# Patient Record
Sex: Female | Born: 1935 | Race: White | Hispanic: No | Marital: Married | Smoking: Never smoker
Health system: Southern US, Community
[De-identification: ages and names within clinical notes are randomized; demographics above are authoritative.]

## PROBLEM LIST (undated history)

## (undated) DIAGNOSIS — S72009A Fracture of unspecified part of neck of unspecified femur, initial encounter for closed fracture: Secondary | ICD-10-CM

## (undated) DIAGNOSIS — S2249XA Multiple fractures of ribs, unspecified side, initial encounter for closed fracture: Secondary | ICD-10-CM

## (undated) DIAGNOSIS — M810 Age-related osteoporosis without current pathological fracture: Secondary | ICD-10-CM

## (undated) DIAGNOSIS — G20A1 Parkinson's disease without dyskinesia, without mention of fluctuations: Secondary | ICD-10-CM

## (undated) DIAGNOSIS — K219 Gastro-esophageal reflux disease without esophagitis: Secondary | ICD-10-CM

## (undated) DIAGNOSIS — M546 Pain in thoracic spine: Secondary | ICD-10-CM

## (undated) DIAGNOSIS — K117 Disturbances of salivary secretion: Secondary | ICD-10-CM

## (undated) DIAGNOSIS — I10 Essential (primary) hypertension: Secondary | ICD-10-CM

## (undated) DIAGNOSIS — M4120 Other idiopathic scoliosis, site unspecified: Secondary | ICD-10-CM

## (undated) DIAGNOSIS — M1990 Unspecified osteoarthritis, unspecified site: Secondary | ICD-10-CM

## (undated) DIAGNOSIS — T887XXA Unspecified adverse effect of drug or medicament, initial encounter: Secondary | ICD-10-CM

## (undated) DIAGNOSIS — IMO0002 Reserved for concepts with insufficient information to code with codable children: Secondary | ICD-10-CM

## (undated) DIAGNOSIS — E8809 Other disorders of plasma-protein metabolism, not elsewhere classified: Secondary | ICD-10-CM

## (undated) DIAGNOSIS — J189 Pneumonia, unspecified organism: Secondary | ICD-10-CM

## (undated) DIAGNOSIS — J45909 Unspecified asthma, uncomplicated: Secondary | ICD-10-CM

## (undated) DIAGNOSIS — W19XXXA Unspecified fall, initial encounter: Secondary | ICD-10-CM

## (undated) DIAGNOSIS — I499 Cardiac arrhythmia, unspecified: Secondary | ICD-10-CM

## (undated) DIAGNOSIS — G20 Parkinson's disease: Secondary | ICD-10-CM

## (undated) HISTORY — DX: Reserved for concepts with insufficient information to code with codable children: IMO0002

## (undated) HISTORY — DX: Disturbances of salivary secretion: K11.7

## (undated) HISTORY — DX: Other disorders of plasma-protein metabolism, not elsewhere classified: E88.09

## (undated) HISTORY — DX: Unspecified adverse effect of drug or medicament, initial encounter: T88.7XXA

## (undated) HISTORY — DX: Multiple fractures of ribs, unspecified side, initial encounter for closed fracture: S22.49XA

## (undated) HISTORY — DX: Parkinson's disease: G20

## (undated) HISTORY — DX: Other idiopathic scoliosis, site unspecified: M41.20

## (undated) HISTORY — DX: Age-related osteoporosis without current pathological fracture: M81.0

## (undated) HISTORY — DX: Fracture of unspecified part of neck of unspecified femur, initial encounter for closed fracture: S72.009A

## (undated) HISTORY — PX: HIP SURGERY: SHX245

## (undated) HISTORY — DX: Unspecified fall, initial encounter: W19.XXXA

## (undated) HISTORY — DX: Pain in thoracic spine: M54.6

## (undated) HISTORY — DX: Parkinson's disease without dyskinesia, without mention of fluctuations: G20.A1

---

## 2000-03-24 HISTORY — PX: HERNIA REPAIR: SHX51

## 2004-02-20 ENCOUNTER — Ambulatory Visit: Payer: Self-pay | Admitting: Internal Medicine

## 2005-03-18 ENCOUNTER — Ambulatory Visit: Payer: Self-pay | Admitting: Internal Medicine

## 2006-03-25 ENCOUNTER — Ambulatory Visit: Payer: Self-pay | Admitting: Internal Medicine

## 2006-07-22 ENCOUNTER — Ambulatory Visit: Payer: Self-pay | Admitting: Internal Medicine

## 2007-03-10 ENCOUNTER — Encounter: Admission: RE | Admit: 2007-03-10 | Discharge: 2007-03-10 | Payer: Self-pay | Admitting: Neurology

## 2008-04-04 ENCOUNTER — Ambulatory Visit: Payer: Self-pay | Admitting: Internal Medicine

## 2009-02-27 ENCOUNTER — Ambulatory Visit: Payer: Self-pay | Admitting: Ophthalmology

## 2009-07-23 ENCOUNTER — Encounter: Admission: RE | Admit: 2009-07-23 | Discharge: 2009-07-23 | Payer: Self-pay | Admitting: Neurology

## 2010-04-22 ENCOUNTER — Ambulatory Visit: Payer: Self-pay | Admitting: Surgery

## 2010-04-23 ENCOUNTER — Ambulatory Visit: Payer: Self-pay | Admitting: Internal Medicine

## 2010-04-29 ENCOUNTER — Ambulatory Visit: Payer: Self-pay | Admitting: Surgery

## 2010-08-16 ENCOUNTER — Inpatient Hospital Stay: Payer: Self-pay | Admitting: Specialist

## 2010-08-20 ENCOUNTER — Encounter: Payer: Self-pay | Admitting: Internal Medicine

## 2010-08-23 ENCOUNTER — Encounter: Payer: Self-pay | Admitting: Internal Medicine

## 2010-09-22 ENCOUNTER — Encounter: Payer: Self-pay | Admitting: Internal Medicine

## 2010-10-23 ENCOUNTER — Encounter: Payer: Self-pay | Admitting: Internal Medicine

## 2011-01-27 ENCOUNTER — Ambulatory Visit: Payer: Self-pay | Admitting: Internal Medicine

## 2011-03-11 ENCOUNTER — Ambulatory Visit: Payer: Self-pay | Admitting: Physical Medicine and Rehabilitation

## 2011-03-27 ENCOUNTER — Ambulatory Visit: Payer: Self-pay | Admitting: Unknown Physician Specialty

## 2011-03-27 LAB — BASIC METABOLIC PANEL
Anion Gap: 11 (ref 7–16)
Calcium, Total: 9 mg/dL (ref 8.5–10.1)
Creatinine: 0.69 mg/dL (ref 0.60–1.30)
EGFR (African American): >60
EGFR (Non-African Amer.): >60
Glucose: 103 mg/dL — ABNORMAL HIGH (ref 65–99)
Sodium: 140 mmol/L (ref 136–145)

## 2011-03-27 LAB — CBC
HCT: 35.7 % (ref 35.0–47.0)
HGB: 12 g/dL (ref 12.0–16.0)
WBC: 7.4 10*3/uL (ref 3.6–11.0)

## 2011-03-27 LAB — URINALYSIS, COMPLETE
Nitrite: NEGATIVE
Protein: 30
Specific Gravity: 1.013 (ref 1.003–1.030)

## 2011-03-28 ENCOUNTER — Ambulatory Visit: Payer: Self-pay | Admitting: Unknown Physician Specialty

## 2011-06-10 ENCOUNTER — Ambulatory Visit: Payer: Self-pay | Admitting: Unknown Physician Specialty

## 2011-06-12 LAB — PATHOLOGY REPORT

## 2011-09-08 ENCOUNTER — Ambulatory Visit: Payer: Self-pay | Admitting: Internal Medicine

## 2012-07-15 ENCOUNTER — Other Ambulatory Visit: Payer: Self-pay

## 2012-07-15 MED ORDER — ROPINIROLE HCL 3 MG PO TABS: 3.0000 mg | ORAL_TABLET | Freq: Three times a day (TID) | ORAL | Status: DC

## 2012-07-15 NOTE — Telephone Encounter (Signed)
Former [facility] patient assigned to Dr Athar.  

## 2012-07-30 ENCOUNTER — Other Ambulatory Visit: Payer: Self-pay

## 2012-07-30 MED ORDER — AMANTADINE HCL 100 MG PO CAPS: 100.0000 mg | ORAL_CAPSULE | Freq: Every day | ORAL | Status: DC

## 2012-07-30 NOTE — Telephone Encounter (Signed)
Former [facility] patient assigned to Dr Athar.  

## 2012-08-24 ENCOUNTER — Encounter: Payer: Self-pay | Admitting: Neurology

## 2012-08-24 ENCOUNTER — Ambulatory Visit (INDEPENDENT_AMBULATORY_CARE_PROVIDER_SITE_OTHER): Payer: Medicare Other | Admitting: Neurology

## 2012-08-24 VITALS — BP 161/87 | HR 83 | Temp 97.0°F | Ht 62.0 in | Wt 139.0 lb

## 2012-08-24 DIAGNOSIS — G20 Parkinson's disease: Secondary | ICD-10-CM

## 2012-08-24 MED ORDER — ROPINIROLE HCL 3 MG PO TABS
3.0000 mg | ORAL_TABLET | Freq: Three times a day (TID) | ORAL | Status: DC
Start: 2012-08-24 — End: 2013-07-26

## 2012-08-24 MED ORDER — AMANTADINE HCL 100 MG PO CAPS: 100.0000 mg | ORAL_CAPSULE | Freq: Every day | ORAL | Status: DC

## 2012-08-24 NOTE — Patient Instructions (Addendum)
I think overall you are doing fairly well but I do want to suggest a few things today:  Remember to drink plenty of fluid, eat healthy meals and do not skip any meals. Try to eat protein with a every meal and eat a healthy snack such as fruit or nuts in between meals. Try to keep a regular sleep-wake schedule and try to exercise daily, particularly in the form of walking, 20-30 minutes a day, if you can.   Engage in social activities in your community and with your family and try to keep up with current events by reading the newspaper or watching the news.   As far as your medications are concerned, I would like to suggest no changes.   As far as diagnostic testing: no new test.  I would like to see you back in 3 months, sooner if we need to. Please call us with any interim questions, concerns, problems, updates or refill requests.  Please also call us for any test results so we can go over those with you on the phone. Brett Canales is my clinical assistant and will answer any of your questions and relay your messages to me and also relay most of my messages to you.  Our phone number is [phone]. We also have an after hours call service for urgent matters and there is a physician on-call for urgent questions. For any emergencies you know to call 911 or go to the nearest emergency room.

## 2012-08-24 NOTE — Progress Notes (Signed)
Subjective:    Patient ID: Michelle Beck is a 79 y.o. female.  HPI  Interim history:   Michelle Beck is an 79 year old right-handed woman who presents for followup consultation of her Parkinson's disease, right-sided predominant, with symptoms dating back to about 6-1/2-7 years ago. She's accompanied by her husband today. This is her first visit with me and she previously followed with Dr. Fayrene Fearing love and was last seen by him on 04/27/2012, at which time he felt that she was stable with her Parkinson symptoms and he initiated trial of Cymbalta for her back pain. Falls assessment tool score at the time is 12. She has an underlying medical history of hyperlipidemia, hypertension, chronic back and hip pain, osteoporosis, compression fractures and rib fractures. She had hernia surgery on the right in 2002. She is currently on Citracal, amantadine 100 mg once daily, vitamin D, Cymbalta 30 mg daily, MiraLax, amlodipine, metoprolol, Requip 3 mg 3 times a day.  I reviewed Dr. Imagene Gurney prior notes and the patient's records and below is a summary of that review:  79 year old right-handed woman with a 6+ year history of right hand and no tremor first treated by Dr. Tresa Endo in [place] with Mirapex without benefit. In December 2008 she started seeing Dr. love. She was started on Requip but developed nausea. Amantadine was added. She denies hallucinations or memory loss. She has bizarre dreams and symptoms suggestive of RBD. She had drooling which did not improve with Botox injections. She exercises regularly. She has had multiple falls. In May 2012 she fell and fractured the right hip requiring surgery. She also fractured her right femur in June 2012. She has fracture 2 vertebral bodies in January 2013 requiring surgery. She had a TENS unit and epidural injections and Lidoderm patches without benefit.  She is not taking Cymbalta. She tried it for her back pain, but it did not help. No recent falls, no memory issues, no  depression, no hallucinations.    Her Past Medical History Is Significant For: Past Medical History  Diagnosis Date  . Closed fracture of three ribs   . Closed fracture of unspecified part of vertebral column without mention of spinal cord injury   . Osteoporosis, unspecified   . Closed fracture of unspecified part of neck of femur   . Unspecified disorder of plasma protein metabolism   . Disturbance of salivary secretion   . Pain in thoracic spine   . Paralysis agitans   . Unspecified adverse effect of unspecified drug, medicinal and biological substance     Her Past Surgical History Is Significant For: Past Surgical History  Procedure Laterality Date  . Hernia repair  2002    Her Family History Is Significant For: Family History  Problem Relation Age of Onset  . Heart failure Mother   . Emphysema Father   . Diabetes Sister     Her Social History Is Significant For: History   Social History  . Marital Status: Married    Spouse Name: N/A    Number of Children: N/A  . Years of Education: N/A   Social History Main Topics  . Smoking status: Never Smoker   . Smokeless tobacco: None  . Alcohol Use: No  . Drug Use: No  . Sexually Active: None   Other Topics Concern  . None   Social History Narrative  . None    Her Allergies Are:  Allergies  Allergen Reactions  . Iodine   . Penicillins   :   Her  Current Medications Are:  Outpatient Encounter Prescriptions as of 08/24/2012  Medication Sig Dispense Refill  . acetaminophen (TYLENOL) 325 MG tablet Take 650 mg by mouth every 6 (six) hours as needed for pain.      Marland Kitchen amantadine (SYMMETREL) 100 MG capsule Take 1 capsule (100 mg total) by mouth daily.  90 capsule  3  . amLODipine (NORVASC) 5 MG tablet       . calcium citrate (CALCITRATE - DOSED IN MG ELEMENTAL CALCIUM) 950 MG tablet Take 1 tablet by mouth 3 (three) times daily.      . cholecalciferol (VITAMIN D) 400 UNITS TABS Take 400 Units by mouth 3 (three) times  daily.      Marland Kitchen docusate sodium (COLACE) 100 MG capsule Take 100 mg by mouth 2 (two) times daily as needed for constipation.      . metoprolol tartrate (LOPRESSOR) 25 MG tablet       . omeprazole (PRILOSEC) 20 MG capsule       . polyethylene glycol (MIRALAX / GLYCOLAX) packet Take 0.4 g/kg by mouth as needed.      Marland Kitchen rOPINIRole (REQUIP) 3 MG tablet Take 1 tablet (3 mg total) by mouth 3 (three) times daily.  270 tablet  3  . [DISCONTINUED] amantadine (SYMMETREL) 100 MG capsule Take 1 capsule (100 mg total) by mouth daily.  90 capsule  2  . [DISCONTINUED] rOPINIRole (REQUIP) 3 MG tablet Take 1 tablet (3 mg total) by mouth 3 (three) times daily.  270 tablet  3  . predniSONE (DELTASONE) 10 MG tablet       . [DISCONTINUED] ciprofloxacin (CIPRO) 500 MG tablet       . [DISCONTINUED] meloxicam (MOBIC) 7.5 MG tablet        No facility-administered encounter medications on file as of 08/24/2012.   Review of Systems  Eyes: Positive for visual disturbance (blurred vision).  Respiratory: Positive for cough.   Cardiovascular: Positive for leg swelling.  Genitourinary: Positive for difficulty urinating.  Neurological: Positive for tremors and weakness.  Hematological: Bruises/bleeds easily.    Objective:  Neurologic Exam  Physical Exam Physical Examination:   Filed Vitals:   08/24/12 1212  BP: 161/87  Pulse: 83  Temp: 97 F (36.1 C)    General Examination: The patient is a very pleasant 79 y.o. female in no acute distress.  HEENT: Normocephalic, atraumatic, pupils are equal, round and reactive to light and accommodation. Funduscopic exam is normal with sharp disc margins noted. Extraocular tracking shows mild saccadic breakdown without nystagmus noted. There is limitation to upper gaze. There is moderate decrease in eye blink rate. Hearing is impaired. Tympanic membranes are clear bilaterally. Face is symmetric with moderate facial masking and normal facial sensation. There is no lip, neck or jaw  tremor. Neck is severely rigid with intact passive ROM. There are no carotid bruits on auscultation. Oropharynx exam reveals mild mouth dryness. No significant airway crowding is noted. Mallampati is class II. Tongue protrudes centrally and palate elevates symmetrically.   There is no drooling.   Chest: is clear to auscultation without wheezing, rhonchi or crackles noted.  Heart: sounds are regular and normal without murmurs, rubs or gallops noted.   Abdomen: is soft, non-tender and non-distended with normal bowel sounds appreciated on auscultation.  Extremities: There is 2+ pitting edema in the distal lower extremities bilaterally. Chronic stasis-like changes are noted in the distal legs bilaterally. There are no varicose veins.  Skin: is warm and dry with no trophic changes noted.  Age-related changes are noted on the skin.   Musculoskeletal: exam reveals no obvious joint deformities, tenderness, joint swelling or erythema.  Neurologically:  Mental status: The patient is awake and alert, paying good  attention. She is able to completely provide the history. She is oriented to: person, place, time/date, situation, day of week, month of year and year. Her memory, attention, language and knowledge are fairly well intact. There is no aphasia, agnosia, apraxia or anomia. There is a no significant degree of bradyphrenia. Speech is mildly hypophonic with no dysarthria noted. Mood is congruent and affect is normal.   Cranial nerves are as described above under HEENT exam. In addition, shoulder shrug is normal with equal shoulder height noted.  Motor exam: Normal bulk, and strength for age is noted. There are no dyskinesias noted.  Tone is moderately rigid with presence of cogwheeling in the bilateral upper extremities. There is overall moderate bradykinesia. There is no drift or rebound.  There is a mild resting tremor in the left upper extremity and no resting tremor elsewhere. The tremor is  intermittent.  Romberg is negative.  Reflexes are 1+ in the upper extremities and 1+ in the lower extremities.   Fine motor skills exam: Finger taps are mildly impaired on the right and moderately impaired on the left. Hand movements are mildly impaired on the right and moderately impaired on the left. RAP (rapid alternating patting) is mildly impaired on the right and moderately impaired on the left. Foot taps are moderately impaired on the right and moderately impaired on the right. Foot agility (in the form of heel stomping) is moderately impaired on the right and moderately impaired on the left.    Cerebellar testing shows no dysmetria or intention tremor on finger to nose testing. Heel to shin is unremarkable bilaterally. There is no truncal or gait ataxia.   Sensory exam is intact to light touch.   Gait, station and balance: She stands up from the seated position with moderate difficulty and does need to push up with Her hands. She needs no assistance. No veering to one side is noted. She is not noted to lean to the side. Posture is moderately stooped with kyphoscoliosis noted. Stance is wide-based. She walks with decrease in stride length and pace and uses her 4 wheeled walker well. She turns well.        Assessment and Plan:   Assessment and Plan:  In summary, Michelle Beck is a very pleasant 79 y.o.-year old female with a history of Parkinson's disease, left-sided predominant. Her physical exam is stable and has not progressed in the last 4  months. She is doing fairly well at this time and I reassured the patient in that regard. I do worry about her LE edema, which may be d/t the Requip. I have advised the patient to keep her feet elevated when seated and to use medium strength compression hoses to up to the knees during the day. She may need to reduce or even come off of Requip down the Road. She has never been on Sinemet but is reluctant to try this as she has read about side effects of this  medication including the development of dyskinesias. She says she does not want to be like Michelle Beck. She does have a family history of Parkinson's disease in her sister and her own 69 year old daughter has been diagnosed with early-onset Parkinson's disease as well. I had a long chat with the patient and her husband about Her  symptoms, my findings and the diagnosis of parkinsonism/Parksinson's disease, its prognosis and treatment options. We talked about medical treatments and non-pharmacological approaches. We talked about maintaining a healthy lifestyle in general. I encouraged the patient to eat healthy, exercise daily and keep well hydrated, to keep a scheduled bedtime and wake time routine, to not skip any meals and eat healthy snacks in between meals and to have protein with every meal. In particular, I stressed the importance of regular exercise, within of course the patient's own mobility limitations.   As far as further diagnostic testing is concerned, I suggested: none at this time.  As far as medications are concerned, I recommended the following at this time: no change at this time.  I answered all their questions today and the patient and her husband were in agreement with the above outlined plan. I would like to see the patient back in 3 months, sooner if the need arises and encouraged them to call with any interim questions, concerns, problems or updates and refill requests. I did refill her Requip and amantadine today.

## 2012-12-07 ENCOUNTER — Encounter: Payer: Self-pay | Admitting: Neurology

## 2012-12-07 ENCOUNTER — Ambulatory Visit (INDEPENDENT_AMBULATORY_CARE_PROVIDER_SITE_OTHER): Payer: Medicare Other | Admitting: Neurology

## 2012-12-07 VITALS — BP 135/73 | HR 86 | Temp 98.1°F | Ht <= 58 in | Wt 140.0 lb

## 2012-12-07 DIAGNOSIS — M4120 Other idiopathic scoliosis, site unspecified: Secondary | ICD-10-CM

## 2012-12-07 DIAGNOSIS — R231 Pallor: Secondary | ICD-10-CM

## 2012-12-07 DIAGNOSIS — G20 Parkinson's disease: Secondary | ICD-10-CM

## 2012-12-07 DIAGNOSIS — R609 Edema, unspecified: Secondary | ICD-10-CM

## 2012-12-07 DIAGNOSIS — G20A1 Parkinson's disease without dyskinesia, without mention of fluctuations: Secondary | ICD-10-CM

## 2012-12-07 DIAGNOSIS — R293 Abnormal posture: Secondary | ICD-10-CM

## 2012-12-07 DIAGNOSIS — R600 Localized edema: Secondary | ICD-10-CM

## 2012-12-07 DIAGNOSIS — L819 Disorder of pigmentation, unspecified: Secondary | ICD-10-CM

## 2012-12-07 MED ORDER — CARBIDOPA-LEVODOPA 25-100 MG PO TABS: ORAL_TABLET | ORAL | Status: DC

## 2012-12-07 NOTE — Progress Notes (Signed)
Subjective:    Beck ID: Michelle Beck is a 79 y.o. female.  HPI  Interim history:    Michelle Beck is an 79 year old right-handed woman who presents for followup consultation of her Parkinson's disease, right-sided predominant, with symptoms dating back to about 6-1/2-7 years ago. She's accompanied by her husband again today. I first met her on 08/24/2012, which time I did not add any new test or make any medication changes. I did talk to her about her lower extremity swelling and explained that Michelle Beck may be to blame in part for that. She has a family history of Parkinson's disease and her her daughter has been diagnosed with young onset Parkinson's disease recently. Michelle Beck previously followed with Dr. Fayrene Beck love and was last seen by him on 04/27/2012, at which time he felt that she was stable with her Parkinson symptoms and he initiated a trial of Cymbalta for her back pain. Falls assessment tool score at Michelle time was 12. She has an underlying medical history of hyperlipidemia, hypertension, chronic back and hip pain, osteoporosis, compression fractures and rib fractures. She had hernia surgery on Michelle right in 2002. She is currently on Citracal, amantadine 100 mg once daily, vitamin D, Cymbalta 30 mg daily, MiraLax, amlodipine, metoprolol, Beck 3 mg 3 times a day.  She has a 6+ year history of left hand tremor first treated by Dr. Liana Beck in [place] with Mirapex without benefit. In December 2008 she started seeing Dr. Sandria Beck. She was started on Beck but developed nausea. Amantadine was added. She denies hallucinations or memory loss. She has bizarre dreams and symptoms suggestive of RBD. She had drooling which did not improve with Botox injections. She exercises regularly. She has had multiple falls. In May 2012 she fell and fractured Michelle right hip requiring surgery. She also fractured her right femur in June 2012. She has fracture 2 vertebral bodies in January 2013 requiring surgery.  She had a TENS unit and epidural injections and Lidoderm patches without benefit.  At Michelle time of her visit in June with me she stated that she was not taking Cymbalta as she tried it but it did not help. She denied any recent falls, memory issues, depression, or hallucinations. She was reluctant to try Sinemet as she had read about its side effects and stated that she did not want to end up "like Michelle Beck". She uses a walker outside and mostly a single point cane inside Michelle house. She has noted a lower extremities distally in Michelle summer time since last summer. She has noted a purplish discoloration in her distal legs for Michelle past few years. She has had spider veins for years. Her feet and distal legs are sensitive. Her father had circulation problems in his legs, she recalls. She has noted more drooling. She has not had any recent dream enactments.    Her Past Medical History Is Significant For: Past Medical History  Diagnosis Date  . Closed fracture of three ribs   . Closed fracture of unspecified part of vertebral column without mention of spinal cord injury   . Osteoporosis, unspecified   . Closed fracture of unspecified part of neck of femur   . Unspecified disorder of plasma protein metabolism   . Disturbance of salivary secretion   . Pain in thoracic spine   . Paralysis agitans   . Unspecified adverse effect of unspecified drug, medicinal and biological substance     Her Past Surgical History Is Significant For: Past  Surgical History  Procedure Laterality Date  . Hernia repair  2002    Her Family History Is Significant For: Family History  Problem Relation Age of Onset  . Heart failure Mother   . Emphysema Father   . Diabetes Sister     Her Social History Is Significant For: History   Social History  . Marital Status: Married    Spouse Name: N/A    Number of Children: N/A  . Years of Education: N/A   Social History Main Topics  . Smoking status: Never Smoker    . Smokeless tobacco: None  . Alcohol Use: No  . Drug Use: No  . Sexual Activity: None   Other Topics Concern  . None   Social History Narrative  . None    Her Allergies Are:  Allergies  Allergen Reactions  . Iodine   . Penicillins   :   Her Current Medications Are:  Outpatient Encounter Prescriptions as of 12/07/2012  Medication Sig Dispense Refill  . amantadine (SYMMETREL) 100 MG capsule Take 1 capsule (100 mg total) by mouth daily.  90 capsule  3  . amLODipine (NORVASC) 5 MG tablet       . calcium citrate (CALCITRATE - DOSED IN MG ELEMENTAL CALCIUM) 950 MG tablet Take 1 tablet by mouth 3 (three) times daily.      . cholecalciferol (VITAMIN D) 400 UNITS TABS Take 400 Units by mouth 3 (three) times daily.      Marland Kitchen docusate sodium (COLACE) 100 MG capsule Take 100 mg by mouth 2 (two) times daily as needed for constipation.      . metoprolol tartrate (LOPRESSOR) 25 MG tablet       . omeprazole (PRILOSEC) 20 MG capsule       . polyethylene glycol (MIRALAX / GLYCOLAX) packet Take 0.4 g/kg by mouth as needed.      Marland Kitchen rOPINIRole (Beck) 3 MG tablet Take 1 tablet (3 mg total) by mouth 3 (three) times daily.  270 tablet  3  . carbidopa-levodopa (SINEMET IR) 25-100 MG per tablet Take 1/2 pill twice daily x 1 week, then 1/2 pill 3 times a day thereafter.  45 tablet  2  . [DISCONTINUED] acetaminophen (TYLENOL) 325 MG tablet Take 650 mg by mouth every 6 (six) hours as needed for pain.      . [DISCONTINUED] predniSONE (DELTASONE) 10 MG tablet        No facility-administered encounter medications on file as of 12/07/2012.  : Review of Systems  Constitutional: Positive for fatigue.  Cardiovascular: Positive for leg swelling.  Skin:       moles  Allergic/Immunologic:       Runny nose Skin sensitivity  Neurological: Positive for tremors and weakness.  Hematological: Bruises/bleeds easily.    Objective:  Neurologic Exam  Physical Exam Physical Examination:   Filed Vitals:    12/07/12 1159  BP: 135/73  Pulse: 86  Temp: 98.1 F (36.7 C)    General Examination: Michelle Beck is a very pleasant 79 y.o. female in no acute distress. She is seated on Michelle seat of her rolling walker area at  HEENT: Normocephalic, atraumatic, pupils are equal, round and reactive to light and accommodation. Extraocular tracking shows mild saccadic breakdown without nystagmus noted. There is limitation to upper gaze. There is moderate decrease in eye blink rate. Hearing is impaired. Face is symmetric with moderate facial masking and normal facial sensation. There is no lip, neck or jaw tremor. Neck is severely rigid with  intact passive ROM. There are no carotid bruits on auscultation. Oropharynx exam reveals mild mouth dryness. No significant airway crowding is noted. Mallampati is class II. Tongue protrudes centrally and palate elevates symmetrically. There is minimal drooling.   Chest: is clear to auscultation without wheezing, rhonchi or crackles noted.  Heart: sounds are regular and normal without murmurs, rubs or gallops noted.   Abdomen: is soft, non-tender and non-distended with normal bowel sounds appreciated on auscultation.  Extremities: There is 1+ pitting edema in Michelle distal lower extremities bilaterally, primarily around her ankles. Chronic stasis-like changes are noted in Michelle distal legs bilaterally. There are no varicose veins, but she has spider veins and a purplish discoloration and findings consistent with livedo reticularis.  Skin: is warm and dry with no trophic changes noted. Age-related changes are noted on Michelle skin.   Musculoskeletal: exam reveals no obvious joint deformities, tenderness, joint swelling or erythema.  Neurologically:  Mental status: Michelle Beck is awake and alert, paying good  attention. She is able to completely provide Michelle history. She is oriented to: person, place, time/date, situation, day of week, month of year and year. Her memory, attention,  language and knowledge are fairly well intact. There is no aphasia, agnosia, apraxia or anomia. There is a no significant degree of bradyphrenia. Speech is mildly hypophonic with no dysarthria noted. Mood is congruent and affect is normal.   Cranial nerves are as described above under HEENT exam. In addition, shoulder shrug is normal with equal shoulder height noted.  Motor exam: Normal bulk, and strength for age is noted. There are no dyskinesias noted.  Tone is moderately rigid with presence of cogwheeling in Michelle bilateral upper extremities, left worse than right. There is overall moderate bradykinesia. There is no drift or rebound.  There is a mild resting tremor in Michelle left upper extremity and no resting tremor elsewhere. Michelle tremor is intermittent.  Romberg is not tested today.  Reflexes are 1+ in Michelle upper extremities and 1+ in Michelle lower extremities.   Fine motor skills exam: Finger taps are mildly to moderately impaired on Michelle right and moderately impaired on Michelle left. Hand movements are mildly impaired on Michelle right and moderately impaired on Michelle left. RAP (rapid alternating patting) is mildly impaired on Michelle right and moderately impaired on Michelle left. Foot taps are moderately impaired on Michelle right and moderately to severely impaired on Michelle left. Foot agility (in Michelle form of heel stomping) is moderately impaired on Michelle right and moderately impaired on Michelle left.    Cerebellar testing shows no dysmetria or intention tremor on finger to nose testing. Heel to shin is unremarkable bilaterally. There is no truncal or gait ataxia.   Sensory exam is intact to light touch.   Gait, station and balance: She stands up from Michelle seated position with moderate difficulty and does need to push up with Her hands. She needs no assistance. No veering to one side is noted. She is not noted to lean to Michelle side. Posture is moderately stooped with kyphoscoliosis noted. Stance is wide-based. She walks with decrease in  stride length and pace and uses her 4 wheeled walker well. She turns well. She has no freezing. She has mild start hesitation.       Assessment and Plan:   In summary, Michelle Beck is a very pleasant 79 y.o.-year old female with a history of advanced Parkinson's disease, left-sided predominant. Her physical exam is slightly worse than last time. I do worry  about her LE edema, which may be d/t Michelle Beck. She may also have livedo reticularis from her amantadine. I have advised Michelle Beck to keep her feet elevated when seated and to use medium strength compression hoses to up to Michelle knees during Michelle day. She may need to reduce or even come off of Beck down [address]. I would like to go ahead and refer her to a vascular specialist for evaluation. She would like to go to a place in [place] for convenience. She has never been on Sinemet but I would like to introduce a small dose of this. She has concerns about potential side effects we went over common side effects and also some rare side effects with Michelle medication. She is primarily worried about developing dyskinesias and hallucinations. I explained to her that between levodopa and Beck, Michelle Beck is probably more likely to cause her side effects and she has been tolerating it fairly well, which is reassuring. I again had a long conversation with Michelle Beck and her husband about Her symptoms, my findings and Michelle diagnosis of Parksinson's disease, its prognosis and treatment options. We talked about medical treatments and non-pharmacological approaches. We talked about maintaining a healthy lifestyle in general. I encouraged Michelle Beck to eat healthy, exercise daily and keep well hydrated, to keep a scheduled bedtime and wake time routine, to not skip any meals and eat healthy snacks in between meals and to have protein with every meal. In particular, I stressed Michelle importance of regular exercise, within of course Michelle Beck's own mobility limitations.    As far as further diagnostic testing is concerned, I suggested: Referral to a vascular specialist. As far as medications are concerned, I recommended Michelle following at this time: no change in her Beck for amantadine at this time, but we will start Sinemet 25-100 mg strength half a pill twice daily for one week, then increase to half a pill 3 times a day thereafter, taking it at 8 AM, noon, and 4 PM. She is advised to take it away from her mealtimes to ensure better absorption.  I answered all their questions today and Michelle Beck and her husband were in agreement with Michelle above outlined plan. I would like to see Michelle Beck back in 3 months, sooner if Michelle need arises and encouraged them to call with any interim questions, concerns, problems or updates and refill requests. I did prescribe Michelle Sinemet today but she did not require any refills on her Beck and amantadine today.

## 2012-12-07 NOTE — Patient Instructions (Addendum)
I think overall you are doing fairly well but I do want to suggest a few things today:  Remember to drink plenty of fluid, eat healthy meals and do not skip any meals. Try to eat protein with a every meal and eat a healthy snack such as fruit or nuts in between meals. Try to keep a regular sleep-wake schedule and try to exercise daily, particularly in the form of walking, 20-30 minutes a day, if you can.   As far as your medications are concerned, I would like to suggest a trial of Sinemet (generic name: carbidopa-levodopa) 25/100 mg: Take half a pill twice daily (8 AM and noon) for one week, then half a pill 3 times a day (8 AM, noon, and 4 PM) thereafter. Please try to take the medication away from you mealtimes, that is, ideally either one hour before or 2 hours after your meal to ensure optimal absorption. The medication can interfere with the protein content of your meal and trying to the protein in your food and therefore not get fully absorbed.  Common side effects reported are: Nausea, vomiting, sedation, confusion, lightheadedness. Rare side effects include hallucinations, severe nausea or vomiting, diarrhea and significant drop in blood pressure especially when going from lying to standing or from sitting to standing.     As far as diagnostic testing: I would like for you to see a vein specialist. We will try to refer you in [place].   I would like to see you back in 3 months, sooner if we need to. Please call us with any interim questions, concerns, problems, updates or refill requests.  Brett Canales is my clinical assistant and will answer any of your questions and relay your messages to me and also relay most of my messages to you.  Our phone number is [phone]. We also have an after hours call service for urgent matters and there is a physician on-call for urgent questions. For any emergencies you know to call 911 or go to the nearest emergency room.

## 2012-12-09 ENCOUNTER — Encounter: Payer: Self-pay | Admitting: Vascular Surgery

## 2012-12-09 ENCOUNTER — Other Ambulatory Visit: Payer: Self-pay

## 2012-12-09 DIAGNOSIS — R609 Edema, unspecified: Secondary | ICD-10-CM

## 2012-12-23 ENCOUNTER — Encounter: Payer: Self-pay | Admitting: Vascular Surgery

## 2012-12-23 ENCOUNTER — Encounter (HOSPITAL_COMMUNITY): Payer: Self-pay

## 2013-03-07 ENCOUNTER — Encounter: Payer: Self-pay | Admitting: Neurology

## 2013-03-07 ENCOUNTER — Ambulatory Visit (INDEPENDENT_AMBULATORY_CARE_PROVIDER_SITE_OTHER): Payer: Medicare Other | Admitting: Neurology

## 2013-03-07 ENCOUNTER — Encounter (INDEPENDENT_AMBULATORY_CARE_PROVIDER_SITE_OTHER): Payer: Self-pay

## 2013-03-07 VITALS — BP 178/96 | HR 77 | Temp 97.5°F | Ht <= 58 in | Wt 140.0 lb

## 2013-03-07 DIAGNOSIS — G20A1 Parkinson's disease without dyskinesia, without mention of fluctuations: Secondary | ICD-10-CM

## 2013-03-07 DIAGNOSIS — R600 Localized edema: Secondary | ICD-10-CM

## 2013-03-07 DIAGNOSIS — L819 Disorder of pigmentation, unspecified: Secondary | ICD-10-CM

## 2013-03-07 DIAGNOSIS — M4120 Other idiopathic scoliosis, site unspecified: Secondary | ICD-10-CM

## 2013-03-07 DIAGNOSIS — R609 Edema, unspecified: Secondary | ICD-10-CM

## 2013-03-07 DIAGNOSIS — R231 Pallor: Secondary | ICD-10-CM

## 2013-03-07 DIAGNOSIS — G20 Parkinson's disease: Secondary | ICD-10-CM

## 2013-03-07 DIAGNOSIS — R293 Abnormal posture: Secondary | ICD-10-CM

## 2013-03-07 NOTE — Progress Notes (Signed)
Subjective:    Patient ID: Michelle Beck is a 79 y.o. female.  HPI    Interim history:  Michelle Beck is an 79 year old right-handed woman who presents for followup consultation of her right-sided predominant Parkinson's disease approximately 7 years duration, complicated by lower extremity edema, falls and RBD. She is accompanied by her husband again today. I first met her on 12/07/2012 at which time I was worried about her lower extremity edema and I encouraged her to continue using compression stockings to her lower extremities and elevate her legs if possible. I referred her to a vascular specialist. I talked to her about reducing Requip but she was reluctant. I introduced a cautious dose of Sinemet at the time started with half a pill twice daily and with build up to half a pill 3 times a day. I wondered if she had developed livedo reticularis on amantadine. Today, she reports constipation since starting the C/L and she has not noted much in the way of improvement in her mobility. She used to have occasional constipation before and Miralax prn helped, but has not been helping lately since the C/L and she has had no other SEs, but the constipation bothers her. She has improved swelling and reports swelling primarily in the summer time. She did not go to see the vein specialist. She uses her cane more than the walker.  I first met her on 08/24/2012, which time I did not add any new test or make any medication changes. I did talk to her about her lower extremity swelling and explained that the Requip may be to blame in part for that. She has a family history of Parkinson's disease and her her daughter has been diagnosed with young onset Parkinson's disease recently. The patient previously followed with Dr. Avie Echevaria and was last seen by him on 04/27/2012, at which time he felt that she was stable and he initiated a trial of Cymbalta for her back pain, but she stopped it as it did not help.  She has an  underlying medical history of hyperlipidemia, hypertension, chronic back and hip pain, osteoporosis, compression fractures and rib fractures. She had hernia surgery on the right in 2002.  She was treated by Dr. Liana Crocker in [place] with Mirapex without benefit. In December 2008 she started seeing Dr. Sandria Manly. She was started on Requip but she developed nausea. Amantadine was added. She denied hallucinations or memory loss and has had bizarre dreams and symptoms suggestive of RBD. She had drooling which did not improve with Botox injections. She exercises regularly. She has had multiple falls. In May 2012 she fell and fractured the right hip requiring surgery. She also fractured her right femur in June 2012. She has fracture 2 vertebral bodies in January 2013 requiring surgery. She had a TENS unit and epidural injections and Lidoderm patches without benefit.   Her Past Medical History Is Significant For: Past Medical History  Diagnosis Date  . Closed fracture of three ribs   . Closed fracture of unspecified part of vertebral column without mention of spinal cord injury   . Osteoporosis, unspecified   . Closed fracture of unspecified part of neck of femur   . Unspecified disorder of plasma protein metabolism   . Disturbance of salivary secretion   . Pain in thoracic spine   . Paralysis agitans   . Unspecified adverse effect of unspecified drug, medicinal and biological substance   . Scoliosis (and kyphoscoliosis), idiopathic     Her Past Surgical  History Is Significant For: Past Surgical History  Procedure Laterality Date  . Hernia repair  2002    Her Family History Is Significant For: Family History  Problem Relation Age of Onset  . Heart failure Mother   . Heart disease Mother   . Emphysema Father   . COPD Father   . Diabetes Sister     Her Social History Is Significant For: History   Social History  . Marital Status: Married    Spouse Name: N/A    Number of Children: N/A   . Years of Education: N/A   Social History Main Topics  . Smoking status: Never Smoker   . Smokeless tobacco: Never Used  . Alcohol Use: No  . Drug Use: No  . Sexual Activity: None   Other Topics Concern  . None   Social History Narrative  . None    Her Allergies Are:  Allergies  Allergen Reactions  . Iodine   . Penicillins   :   Her Current Medications Are:  Outpatient Encounter Prescriptions as of 03/07/2013  Medication Sig  . amantadine (SYMMETREL) 100 MG capsule Take 1 capsule (100 mg total) by mouth daily.  Marland Kitchen amLODipine (NORVASC) 5 MG tablet Take 2.5 mg by mouth daily.   . calcium citrate (CALCITRATE - DOSED IN MG ELEMENTAL CALCIUM) 950 MG tablet Take 1 tablet by mouth 3 (three) times daily.  . carbidopa-levodopa (SINEMET IR) 25-100 MG per tablet Take 1/2 pill twice daily x 1 week, then 1/2 pill 3 times a day thereafter.  . cholecalciferol (VITAMIN D) 400 UNITS TABS Take 400 Units by mouth 3 (three) times daily.  Marland Kitchen docusate sodium (COLACE) 100 MG capsule Take 100 mg by mouth 2 (two) times daily as needed for constipation.  . metoprolol tartrate (LOPRESSOR) 25 MG tablet Take 12.5 mg by mouth daily.   Marland Kitchen omeprazole (PRILOSEC) 20 MG capsule Take 20 mg by mouth daily.   . polyethylene glycol (MIRALAX / GLYCOLAX) packet Take 0.4 g/kg by mouth as needed.  Marland Kitchen rOPINIRole (REQUIP) 3 MG tablet Take 1 tablet (3 mg total) by mouth 3 (three) times daily.  :  Review of Systems:  Out of a complete 14 point review of systems, all are reviewed and negative with the exception of these symptoms as listed below:   Review of Systems  Constitutional: Negative.   HENT: Positive for drooling and rhinorrhea.   Eyes: Positive for photophobia.  Respiratory: Positive for cough.   Cardiovascular: Negative.   Gastrointestinal: Positive for constipation.  Endocrine: Negative.   Genitourinary: Negative.   Musculoskeletal: Positive for back pain.  Skin: Negative.   Neurological: Negative.    Hematological: Bruises/bleeds easily.  Psychiatric/Behavioral: Negative.     Objective:  Neurologic Exam  Physical Exam Physical Examination:   Filed Vitals:   03/07/13 1118  BP: 178/96  Pulse:   Temp:      General Examination: The patient is a very pleasant 79 y.o. female in no acute distress. She is seated on the seat of her rolling walker.   HEENT: Normocephalic, atraumatic, pupils are equal, round and reactive to light and accommodation. Fundoscopy is normal. Extraocular tracking shows mild saccadic breakdown without nystagmus noted. There is limitation to upper gaze. There is moderate decrease in eye blink rate. Hearing is impaired. Face is symmetric with moderate facial masking and normal facial sensation. There is no lip, neck or jaw tremor. Neck is severely rigid with intact passive ROM. There are no carotid bruits on auscultation.  Oropharynx exam reveals mild mouth dryness. No significant airway crowding is noted. Mallampati is class II. Tongue protrudes centrally and palate elevates symmetrically. There is minimal drooling.   Chest: is clear to auscultation without wheezing, rhonchi or crackles noted.  Heart: sounds are regular and normal without murmurs, rubs or gallops noted.   Abdomen: is soft, non-tender and non-distended with normal bowel sounds appreciated on auscultation.  Extremities: There is trace edema in the distal lower extremities bilaterally, primarily around her ankles. Chronic stasis-like changes are noted in the distal legs bilaterally. There are no varicose veins, but she has spider veins and a purplish discoloration and findings consistent with livedo reticularis.  Skin: is warm and dry with no trophic changes noted. Age-related changes are noted on the skin.   Musculoskeletal: exam reveals no obvious joint deformities, tenderness, joint swelling or erythema.  Neurologically:  Mental status: The patient is awake and alert, paying good  attention. She  is able to completely provide the history. She is oriented to: person, place, time/date, situation, day of week, month of year and year. Her memory, attention, language and knowledge are fairly well intact. There is no aphasia, agnosia, apraxia or anomia. There is a no significant degree of bradyphrenia. Speech is mildly hypophonic with no dysarthria noted. Mood is congruent and affect is normal.   Cranial nerves are as described above under HEENT exam. In addition, shoulder shrug is normal with equal shoulder height noted.  Motor exam: Normal bulk, and strength for age is noted. There are no dyskinesias noted.  Tone is moderately rigid with presence of cogwheeling in the bilateral upper extremities, left worse than right. There is overall moderate bradykinesia. There is no drift or rebound.  There is a mild resting tremor in the left upper extremity and no resting tremor elsewhere. The tremor is intermittent.  Romberg is not tested today.  Reflexes are 1+ in the upper extremities and 1+ in the lower extremities.   Fine motor skills exam: Finger taps are mildly to moderately impaired on the right and moderately impaired on the left. Hand movements are mildly impaired on the right and moderately impaired on the left. RAP (rapid alternating patting) is mildly impaired on the right and moderately impaired on the left. Foot taps are moderately impaired on the right and moderately to severely impaired on the left. Foot agility (in the form of heel stomping) is moderately impaired on the right and moderately impaired on the left.    Cerebellar testing shows no dysmetria or intention tremor on finger to nose testing. Heel to shin is unremarkable bilaterally. There is no truncal or gait ataxia.   Sensory exam is intact to light touch.   Gait, station and balance: She stands up from the seated position with mild difficulty and does need to push up with Her hands. She needs no assistance. No veering to one side  is noted. She is not noted to lean to the side. Posture is moderately stooped with kyphoscoliosis noted. Stance is wide-based. She walks with decrease in stride length and pace and uses her 4 wheeled walker well. She turns well. She has no freezing. She has mild start hesitation.       Assessment and Plan:   In summary, Michelle Beck is a very pleasant 79 year old female with a history of advanced Parkinson's disease, left-sided predominant, complicated by gait disorder, abnormal posture, and constipation. Her physical exam is stable, her LE edema is improved. She may also have livedo reticularis  from her amantadine. She has tried sinemet, but is troubled by constipation, and she has not noted much in the way of improvement with C/L. Therefore, today, we mutually decided to get her off of Sinemet by dropping by 1 pill each 3 days to a full stop. I again had a long conversation with the patient and her husband about her symptoms, my findings and the diagnosis of Parksinson's disease, its prognosis and treatment options. We talked about medical treatments and non-pharmacological approaches. We talked about maintaining a healthy lifestyle in general. I encouraged the patient to eat healthy, exercise daily and keep well hydrated, to keep a scheduled bedtime and wake time routine, to not skip any meals and eat healthy snacks in between meals and to have protein with every meal. In particular, I stressed the importance of regular exercise, within of course the patient's own mobility limitations.   As far as further diagnostic testing is concerned, I suggested no new test today.  As far as medications are concerned, I recommended the following at this time: no change in her Requip or amantadine at this time, and she is advised to taper off Sinemet. If she notices a flare up in her symptoms such as increased and tremors, increase in rigidity or slowness and realizes that the Sinemet was indeed helpful, we can either  restart it or try her on Stalevo 50 mg strength and introduced that slowly. She is advised to discuss this over the phone in the interim and I will see her back in 3 months, sooner if the need arises. If we decide to go back on Sinemet, we may have to give her something for her constipation and we can try Linzess down the road.  I answered all their questions today and the patient and her husband were in agreement with the above outlined plan. I would like to see the patient back in 3 months, sooner if the need arises and encouraged them to call with any interim questions, concerns, problems or updates and refill requests. She did not require any refills on her Requip and amantadine today.

## 2013-03-07 NOTE — Patient Instructions (Addendum)
I think your Parkinson's disease has remained fairly stable, which is reassuring. Nevertheless, as you know, this disease does progress with time. It can affect your balance, your memory, your mood, your bowel and bladder function, your posture, balance and walking. Overall you are doing fairly well but I do want to suggest a few things today:  Remember to drink plenty of fluid, eat healthy meals and do not skip any meals. Try to eat protein with a every meal and eat a healthy snack such as fruit or nuts in between meals. Try to keep a regular sleep-wake schedule and try to exercise daily, particularly in the form of walking, 20-30 minutes a day, if you can.   Taking your medication on schedule is key.   Try to stay active physically and mentally. Engage in social activities in your community and with your family and try to keep up with current events by reading the newspaper or watching the news. Try to do word puzzles and you may like to do word puzzles and brain games on the computer such as on [email or web address]   As far as your medications are concerned, I would like to suggest that you take your current medication with the following additional changes: tapering off the Sinemet: take 1/2 pill twice daily for 3 days, then 1/2 pill daily for 3 days, then stop. You should look for improvement in your constipation after you are off of it. If you note a flare up in your parkinson's symptoms including more tremor, more stiffness and more slowness, we will try you on a medication called Stalevo 50 mg strength, one pill three times a day. I will hold off on any new medication at this time and we can also wait till your appointment to make changes, or, if you wish, I can call a prescription in.   As far as diagnostic testing, I will order: no new test.   I would like to see you back in 3 months, sooner if we need to. Please call us with any interim questions, concerns, problems, updates or refill  requests.  Brett Canales is my clinical assistant and will answer any of your questions and relay your messages to me and also relay most of my messages to you.  Our phone number is [phone]. We also have an after hours call service for urgent matters and there is a physician on-call for urgent questions, that cannot wait till the next work day. For any emergencies you know to call 911 or go to the nearest emergency room.

## 2013-03-30 ENCOUNTER — Telehealth: Payer: Self-pay | Admitting: Neurology

## 2013-03-30 DIAGNOSIS — G20 Parkinson's disease: Secondary | ICD-10-CM

## 2013-03-30 MED ORDER — CARBIDOPA-LEVODOPA 25-100 MG PO TABS: ORAL_TABLET | ORAL | Status: DC

## 2013-03-30 NOTE — Telephone Encounter (Signed)
Needs another RX for Sinemet. States that she came off of it but realizes that she needs it after she became ill. States Dr. Frances FurbishAthar told her that this might happen. Please refill

## 2013-03-30 NOTE — Telephone Encounter (Signed)
Approved, as prescribed. Thanks

## 2013-06-27 ENCOUNTER — Other Ambulatory Visit: Payer: Self-pay

## 2013-06-27 DIAGNOSIS — G20 Parkinson's disease: Secondary | ICD-10-CM

## 2013-06-27 MED ORDER — CARBIDOPA-LEVODOPA 25-100 MG PO TABS: ORAL_TABLET | ORAL | Status: DC

## 2013-07-26 ENCOUNTER — Encounter (INDEPENDENT_AMBULATORY_CARE_PROVIDER_SITE_OTHER): Payer: Self-pay

## 2013-07-26 ENCOUNTER — Encounter: Payer: Self-pay | Admitting: Neurology

## 2013-07-26 ENCOUNTER — Ambulatory Visit (INDEPENDENT_AMBULATORY_CARE_PROVIDER_SITE_OTHER): Payer: Medicare Other | Admitting: Neurology

## 2013-07-26 VITALS — BP 176/90 | HR 80 | Temp 98.4°F | Ht <= 58 in | Wt 142.0 lb

## 2013-07-26 DIAGNOSIS — G20 Parkinson's disease: Secondary | ICD-10-CM

## 2013-07-26 DIAGNOSIS — R609 Edema, unspecified: Secondary | ICD-10-CM

## 2013-07-26 DIAGNOSIS — M4120 Other idiopathic scoliosis, site unspecified: Secondary | ICD-10-CM

## 2013-07-26 DIAGNOSIS — L819 Disorder of pigmentation, unspecified: Secondary | ICD-10-CM

## 2013-07-26 DIAGNOSIS — R600 Localized edema: Secondary | ICD-10-CM

## 2013-07-26 MED ORDER — ROPINIROLE HCL 3 MG PO TABS: 3.0000 mg | ORAL_TABLET | Freq: Three times a day (TID) | ORAL | Status: DC

## 2013-07-26 MED ORDER — CARBIDOPA-LEVODOPA 25-100 MG PO TABS: 0.5000 | ORAL_TABLET | Freq: Three times a day (TID) | ORAL | Status: DC

## 2013-07-26 MED ORDER — AMANTADINE HCL 100 MG PO CAPS: 100.0000 mg | ORAL_CAPSULE | Freq: Every day | ORAL | Status: DC

## 2013-07-26 NOTE — Progress Notes (Signed)
Subjective:    Patient ID: Michelle Beck is a 80 y.o. female.  HPI    Interim history:   Michelle Beck is an 80 year old right-handed woman with an underlying medical history of hyperlipidemia, hypertension, chronic back, s/p back surgery, hip pain, s/p R hip surgery in 2009, scoliosis, osteoporosis, compression fractures and rib fractures, s/p hernia surgery on the right in 2002, who presents for followup consultation of her left-sided predominant Parkinson's disease approximately 7 1/2 years duration, complicated by lower extremity edema, sialorrhea (which did not respond to botulinum toxin injection), falls and RBD. She is accompanied by her husband again today. I last saw her on 03/07/2013, at which time I felt her lower extremity edema was improved. She reported constipation from Sinemet and did not endorse much in the way of improvement with Sinemet and therefore we decided to taper her off of it. She was to continue with amantadine and Requip. 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 I considered using Linzess for her constipation down the Road. In the interim, she called back and January of this year and requested to restart Sinemet as she felt that she was worse without it.  Today, she reports, that she remained stable. She uses a cane around the house. She tends to have more back pain when using her cane. She has been sleeping well, no recent issues with her memory, RBD, no depression, some anxiety, no delusions, some hallucinations, nothing she worries about and no dyskinesias. No recent falls. She takes Miralax qod or so for constipation. She went back on C/L and it improved her "nervousness". She uses the bathroom once at night.     I first met her on 12/07/2012 at which time I was worried about her lower extremity edema and I encouraged her to continue using compression stockings to her lower extremities and elevate her legs if possible. I referred her to a vascular  specialist. I talked to her about reducing Requip but she was reluctant. I introduced a cautious dose of Sinemet at the time started with half a pill twice daily and with build up to half a pill 3 times a day. I wondered if she had developed livedo reticularis on amantadine.  She did not go to see the vein specialist. She uses her cane more than the walker.  I first met her on 08/24/2012, which time I did not add any new test or make any medication changes. I did talk to her about her lower extremity swelling and explained that the Requip may be to blame in part for that. She has a family history of Parkinson's disease and her her daughter has been diagnosed with young onset Parkinson's disease recently.  The patient previously followed with Dr. Morene Antu and was last seen by him on 04/27/2012, at which time he felt that she was stable and he initiated a trial of Cymbalta for her back pain, but she stopped it as it did not help.  She was treated by Dr. Germain Osgood in [place] with Mirapex without benefit. In December 2008 she started seeing Dr. Erling Cruz. She was started on Requip but she developed nausea. Amantadine was added. She denied hallucinations or memory loss and has had bizarre dreams and symptoms suggestive of RBD. She had drooling which did not improve with Botox injections. She exercises regularly. She has had multiple falls. In May 2012 she fell and fractured the right hip requiring surgery. She also fractured her right femur in  June 2012. She has fracture 2 vertebral bodies in January 2013 requiring surgery. She had a TENS unit and epidural injections and Lidoderm patches without benefit.   Her Past Medical History Is Significant For: Past Medical History  Diagnosis Date  . Closed fracture of three ribs   . Closed fracture of unspecified part of vertebral column without mention of spinal cord injury   . Osteoporosis, unspecified   . Closed fracture of unspecified part of neck of femur   .  Unspecified disorder of plasma protein metabolism   . Disturbance of salivary secretion   . Pain in thoracic spine   . Paralysis agitans   . Unspecified adverse effect of unspecified drug, medicinal and biological substance   . Scoliosis (and kyphoscoliosis), idiopathic     Her Past Surgical History Is Significant For: Past Surgical History  Procedure Laterality Date  . Hernia repair  2002    Her Family History Is Significant For: Family History  Problem Relation Age of Onset  . Heart failure Mother   . Heart disease Mother   . Emphysema Father   . COPD Father   . Diabetes Sister     Her Social History Is Significant For: History   Social History  . Marital Status: Married    Spouse Name: N/A    Number of Children: N/A  . Years of Education: N/A   Occupational History  .      retired   Social History Main Topics  . Smoking status: Never Smoker   . Smokeless tobacco: Never Used  . Alcohol Use: No  . Drug Use: No  . Sexual Activity: None   Other Topics Concern  . None   Social History Narrative  . None    Her Allergies Are:  Allergies  Allergen Reactions  . Iodine   . Penicillins   :   Her Current Medications Are:  Outpatient Encounter Prescriptions as of 07/26/2013  Medication Sig  . amantadine (SYMMETREL) 100 MG capsule Take 1 capsule (100 mg total) by mouth daily.  Marland Kitchen amLODipine (NORVASC) 5 MG tablet Take 2.5 mg by mouth daily.   . calcium citrate (CALCITRATE - DOSED IN MG ELEMENTAL CALCIUM) 950 MG tablet Take 1 tablet by mouth 3 (three) times daily.  . carbidopa-levodopa (SINEMET IR) 25-100 MG per tablet Take 1/2 pill twice daily x 1 week, then 1/2 pill 3 times a day thereafter.  . cholecalciferol (VITAMIN D) 400 UNITS TABS Take 400 Units by mouth 3 (three) times daily.  Marland Kitchen docusate sodium (COLACE) 100 MG capsule Take 100 mg by mouth 2 (two) times daily as needed for constipation.  . metoprolol tartrate (LOPRESSOR) 25 MG tablet Take 12.5 mg by mouth  daily.   Marland Kitchen omeprazole (PRILOSEC) 20 MG capsule Take 20 mg by mouth daily.   . polyethylene glycol (MIRALAX / GLYCOLAX) packet Take 0.4 g/kg by mouth as needed.  Marland Kitchen rOPINIRole (REQUIP) 3 MG tablet Take 1 tablet (3 mg total) by mouth 3 (three) times daily.  :  Review of Systems:  Out of a complete 14 point review of systems, all are reviewed and negative with the exception of these symptoms as listed below:   Review of Systems  HENT: Positive for drooling.   Eyes:       Light sensitivity  Respiratory: Positive for cough.   Musculoskeletal: Positive for back pain, gait problem and neck stiffness.       Walking difficulty  Neurological: Positive for tremors.  Hematological: Bruises/bleeds  easily.    Objective:  Neurologic Exam  Physical Exam Physical Examination:   Filed Vitals:   07/26/13 1156  BP: 176/90  Pulse: 80  Temp: 98.4 F (36.9 C)    General Examination: The patient is a very pleasant 80 y.o. female in no acute distress. She is seated on the seat of her rolling walker.   HEENT: Normocephalic, atraumatic, pupils are equal, round and reactive to light and accommodation. Fundoscopy is normal. Extraocular tracking shows mild saccadic breakdown without nystagmus noted. There is limitation to upper gaze. There is moderate decrease in eye blink rate. Hearing is impaired. Face is symmetric with moderate facial masking and normal facial sensation. There is no lip, neck or jaw tremor. Neck is severely rigid with intact passive ROM. There are no carotid bruits on auscultation. Oropharynx exam reveals mild mouth dryness. No significant airway crowding is noted. Mallampati is class II. Tongue protrudes centrally and palate elevates symmetrically. There is minimal drooling.   Chest: is clear to auscultation without wheezing, rhonchi or crackles noted.  Heart: sounds are regular and normal without murmurs, rubs or gallops noted.   Abdomen: is soft, non-tender and non-distended with  normal bowel sounds appreciated on auscultation.  Extremities: There is 1+ edema in the distal lower extremities bilaterally, primarily around her ankles, little worse than last time. Chronic stasis-like changes are noted in the distal legs bilaterally. There are no varicose veins, but she has spider veins and a purplish discoloration and findings consistent with livedo reticularis, unchanged.  Skin: is warm and dry with no trophic changes noted. Age-related changes are noted on the skin.   Musculoskeletal: exam reveals no obvious joint deformities, tenderness, joint swelling or erythema.  Neurologically:  Mental status: The patient is awake and alert, paying good  attention. She is able to completely provide the history. She is oriented to: person, place, time/date, situation, day of week, month of year and year. Her memory, attention, language and knowledge are fairly well intact. There is no aphasia, agnosia, apraxia or anomia. There is a no significant degree of bradyphrenia. Speech is mildly hypophonic with no dysarthria noted. Mood is congruent and affect is normal.   Cranial nerves are as described above under HEENT exam. In addition, shoulder shrug is normal with equal shoulder height noted.  Motor exam: Normal bulk, and strength for age is noted. There are no dyskinesias noted.  Tone is moderately rigid with presence of cogwheeling in the bilateral upper extremities, left worse than right. There is overall moderate bradykinesia. There is no drift or rebound.  There is a minimal intermittent resting tremor in the left upper extremity and no resting tremor elsewhere. The tremor is intermittent.  Romberg is not tested today.  Reflexes are 1+ in the upper extremities and 1+ in the lower extremities.   Fine motor skills exam: Finger taps are mildly to moderately impaired on the right and moderately impaired on the left. Hand movements are mildly impaired on the right and moderately impaired on  the left. RAP (rapid alternating patting) is mildly impaired on the right and moderately impaired on the left. Foot taps are moderately impaired on the right and moderately to severely impaired on the left. Foot agility (in the form of heel stomping) is moderately impaired on the right and moderately impaired on the left.    Cerebellar testing shows no dysmetria or intention tremor on finger to nose testing. Heel to shin is unremarkable bilaterally, but she has decrease in range of motion  in her right hip. There is no truncal or gait ataxia.   Sensory exam is intact to light touch.   Gait, station and balance: She stands up from the seated position with mild difficulty and does need to push up with Her hands. She needs no assistance. No veering to one side is noted. She is not noted to lean to the side. Posture is moderately stooped with kyphoscoliosis noted. Stance is wide-based. She walks with decrease in stride length and pace and uses her 4 wheeled walker well. She can walk a little bit without the walker. She turns well. She has no freezing. She has mild start hesitation.     Assessment and Plan:   In summary, Michelle Beck is a very pleasant 80 year old female with a history of advanced Parkinson's disease, left-sided predominant, complicated by gait disorder, abnormal posture, RBD, LE edema, and constipation. Her physical exam is for the most part stable. She is encouraged to continue with her current medical regimen which is half a pill of Sinemet 3 times a day, amantadine once daily in the morning and Requip 3 times a day. She is encouraged to drink more water and eat nutritious food. She is advised to stay active physically and mentally and use her walker and walk regularly. I think it is encouraging that she has remained stable and I suggested a six-month followup. I did ask her to use her compression stockings regularly to improve her lower extremity edema. Her chronic constipation is under  reasonable control with neural exit this moment. She has no new cognitive for psychiatric or sleep issues. She has no dyskinesias.   As far as further diagnostic testing is concerned, I suggested no new test today.  As far as medications are concerned, I recommended the following at this time: no change in her Requip or amantadine or Sinemet today.   I answered all their questions today and the patient and her husband were in agreement with the above outlined plan. I renewed her prescriptions today.

## 2013-07-26 NOTE — Patient Instructions (Signed)
I am pleased to see you have remained stable.  We will continue with the same medications.  Follow up in 6 months.

## 2014-01-25 ENCOUNTER — Telehealth: Payer: Self-pay | Admitting: Neurology

## 2014-01-25 NOTE — Telephone Encounter (Signed)
Spoke with patient to reschedule for earlier appt with Dr Frances FurbishAthar, confirmed. For 01/26/14

## 2014-01-26 ENCOUNTER — Encounter: Payer: Self-pay | Admitting: Neurology

## 2014-01-26 ENCOUNTER — Ambulatory Visit: Payer: Medicare Other | Admitting: Neurology

## 2014-01-26 ENCOUNTER — Ambulatory Visit (INDEPENDENT_AMBULATORY_CARE_PROVIDER_SITE_OTHER): Payer: Medicare Other | Admitting: Neurology

## 2014-01-26 VITALS — BP 155/77 | HR 80 | Temp 97.6°F | Ht 59.0 in | Wt 146.0 lb

## 2014-01-26 DIAGNOSIS — M4120 Other idiopathic scoliosis, site unspecified: Secondary | ICD-10-CM

## 2014-01-26 DIAGNOSIS — G20 Parkinson's disease: Secondary | ICD-10-CM

## 2014-01-26 DIAGNOSIS — R600 Localized edema: Secondary | ICD-10-CM

## 2014-01-26 NOTE — Progress Notes (Signed)
Subjective:    Patient ID: Michelle Beck is a 80 y.o. female.  HPI    Interim history:   Ms. Michelle Beck is an 80 year old right-handed woman with an underlying medical history of hyperlipidemia, hypertension, chronic back, s/p back surgery, hip pain, s/p R hip surgery in 2009, scoliosis, osteoporosis, compression fractures and rib fractures, s/p hernia surgery on the right in 2002, who presents for followup consultation of her left-sided predominant Parkinson's disease of approximately 8 years' duration, complicated by lower extremity edema, sialorrhea (which did not respond to botulinum toxin injection in the past), falls and RBD and overall frailty with advanced age. She is accompanied by her husband again today. I last saw her on 07/26/2013, at which time she reported feeling stable. She was using a cane around the house. She reported some back pain. She was sleeping well. She denied no major issues with her memory, exacerbation of RBD, no depression but had some anxiety, no delusions, some hallucinations and no dyskinesias. She reported no recent falls. I did not make any changes to her medications as I felt she was fairly stable. I did worry about her lower extremity edema which I felt was due to the Requip. I asked her to elevate her feet and use compression hose is up to her knees during the day. I talked her about potentially reducing Requip down the road and starting her on Sinemet but she was reluctant to try this for fear of side effects with Sinemet.  Today, she reports being worse slightly. She feels overall slower and her speech is softer. She has been bothered by low back pain but is reluctant to see a back specialist. She tried lower back injections in the past which she felt was not helpful. Physical therapy also did not help much. She continues to take Requip 3 mg 3 times a day. She did not try the lower leg compression hoses as it was too hot this summer.  I saw her on 03/07/2013, at which  time I felt her lower extremity edema was improved. She reported constipation from Sinemet and did not endorse much in the way of improvement with Sinemet and therefore we decided to taper her off of it. She was to continue with amantadine and Requip. 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 I considered using Linzess for her constipation down the Road. In the interim, she called back and January of this year and requested to restart Sinemet as she felt that she was worse without it.   I first met her on 12/07/2012 at which time I was worried about her lower extremity edema and I encouraged her to continue using compression stockings to her lower extremities and elevate her legs if possible. I referred her to a vascular specialist. I talked to her about reducing Requip but she was reluctant. I introduced a cautious dose of Sinemet at the time started with half a pill twice daily and with build up to half a pill 3 times a day. I wondered if she had developed livedo reticularis on amantadine.   She did not go to see the vein specialist. She uses her cane more than the walker.   I first met her on 08/24/2012, which time I did not add any new test or make any medication changes. I did talk to her about her lower extremity swelling and explained that the Requip may be to blame in part for that. She has a family history of  Parkinson's disease and her her daughter has been diagnosed with young onset Parkinson's disease recently.   The patient previously followed with Dr. Morene Antu and was last seen by him on 04/27/2012, at which time he felt that she was stable and he initiated a trial of Cymbalta for her back pain, but she stopped it as it did not help.   She was treated by Dr. Germain Osgood in [place] with Mirapex without benefit. In December 2008 she started seeing Dr. Erling Cruz. She was started on Requip but she developed nausea. Amantadine was added. She denied hallucinations or  memory loss and has had bizarre dreams and symptoms suggestive of RBD. She had drooling which did not improve with Botox injections. She exercises regularly. She has had multiple falls. In May 2012 she fell and fractured the right hip requiring surgery. She also fractured her right femur in June 2012. She has fracture 2 vertebral bodies in January 2013 requiring surgery. She had a TENS unit and epidural injections and Lidoderm patches without benefit.    Her Past Medical History Is Significant For: Past Medical History  Diagnosis Date  . Closed fracture of three ribs   . Closed fracture of unspecified part of vertebral column without mention of spinal cord injury   . Osteoporosis, unspecified   . Closed fracture of unspecified part of neck of femur   . Unspecified disorder of plasma protein metabolism   . Disturbance of salivary secretion   . Pain in thoracic spine   . Paralysis agitans   . Unspecified adverse effect of unspecified drug, medicinal and biological substance   . Scoliosis (and kyphoscoliosis), idiopathic     Her Past Surgical History Is Significant For: Past Surgical History  Procedure Laterality Date  . Hernia repair  2002    Her Family History Is Significant For: Family History  Problem Relation Age of Onset  . Heart failure Mother   . Heart disease Mother   . Emphysema Father   . COPD Father   . Diabetes Sister     Her Social History Is Significant For: History   Social History  . Marital Status: Married    Spouse Name: N/A    Number of Children: N/A  . Years of Education: N/A   Occupational History  .      retired   Social History Main Topics  . Smoking status: Never Smoker   . Smokeless tobacco: Never Used  . Alcohol Use: No  . Drug Use: No  . Sexual Activity: None   Other Topics Concern  . None   Social History Narrative    Her Allergies Are:  Allergies  Allergen Reactions  . Fentanyl Anxiety and Shortness Of Breath  . Actonel  [Risedronate Sodium]     Reaction unknown  . Hydrocodone     Reaction unknown  . Iodine   . Penicillins   . Shellfish Allergy     Reaction unknown  :   Her Current Medications Are:  Outpatient Encounter Prescriptions as of 01/26/2014  Medication Sig  . amantadine (SYMMETREL) 100 MG capsule Take 1 capsule (100 mg total) by mouth daily.  Marland Kitchen amLODipine (NORVASC) 5 MG tablet Take 5 mg by mouth daily. 1/2 tablet daily  . calcium citrate (CALCITRATE - DOSED IN MG ELEMENTAL CALCIUM) 950 MG tablet Take 1 tablet by mouth 3 (three) times daily.  . carbidopa-levodopa (SINEMET IR) 25-100 MG per tablet Take 0.5 tablets by mouth 3 (three) times daily.  . cholecalciferol (VITAMIN  D) 400 UNITS TABS Take 400 Units by mouth 3 (three) times daily.  Marland Kitchen docusate calcium (SURFAK) 240 MG capsule Take 240 mg by mouth as needed for mild constipation.  . docusate sodium (COLACE) 100 MG capsule Take 100 mg by mouth 2 (two) times daily as needed for constipation.  . metoprolol tartrate (LOPRESSOR) 25 MG tablet Take 12.5 mg by mouth daily.   Marland Kitchen omeprazole (PRILOSEC) 20 MG capsule Take 20 mg by mouth daily.   . polyethylene glycol (MIRALAX / GLYCOLAX) packet Take 0.4 g/kg by mouth as needed.  Marland Kitchen rOPINIRole (REQUIP) 3 MG tablet Take 1 tablet (3 mg total) by mouth 3 (three) times daily.  . zoledronic acid (RECLAST) 5 MG/100ML SOLN injection Inject into the vein. yearly  :  Review of Systems:  Out of a complete 14 point review of systems, all are reviewed and negative with the exception of these symptoms as listed below:   Review of Systems  HENT: Positive for drooling and hearing loss.   Eyes:       Light sensitivity, blurred vision  Respiratory: Positive for cough.   Musculoskeletal: Positive for back pain and neck stiffness.  Hematological: Bruises/bleeds easily.    Objective:  Neurologic Exam  Physical Exam Physical Examination:   Filed Vitals:   01/26/14 1059  BP: 155/77  Pulse: 80  Temp: 97.6 F  (36.4 C)    General Examination: The patient is a very pleasant 80 y.o. female in no acute distress. She is seated on the seat of her rolling walker.   HEENT: Normocephalic, atraumatic, pupils are equal, round and reactive to light and accommodation. Fundoscopy is normal. Extraocular tracking shows mild saccadic breakdown without nystagmus noted. There is limitation to upper gaze. There is moderate decrease in eye blink rate. Hearing is impaired. Face is symmetric with moderate facial masking and normal facial sensation. There is no lip, neck or jaw tremor. Neck is severely rigid with intact passive ROM. There are no carotid bruits on auscultation. Oropharynx exam reveals mild mouth dryness. No significant airway crowding is noted. Mallampati is class II. Tongue protrudes centrally and palate elevates symmetrically. There is minimal drooling.   Chest: is clear to auscultation without wheezing, rhonchi or crackles noted.  Heart: sounds are regular and normal without murmurs, rubs or gallops noted.   Abdomen: is soft, non-tender and non-distended with normal bowel sounds appreciated on auscultation.  Extremities: There is 1+ edema in the distal lower extremities bilaterally, primarily around her ankles, stable from last time. Chronic stasis-like changes are noted in the distal legs bilaterally. There are no varicose veins, but she has spider veins and a purplish discoloration and findings consistent with livedo reticularis, unchanged from before.  Skin: is warm and dry with no trophic changes noted. Age-related changes are noted on the skin.   Musculoskeletal: exam reveals no obvious joint deformities, tenderness, joint swelling or erythema.  Neurologically:  Mental status: The patient is awake and alert, paying good  attention. She is able to completely provide the history. She is oriented to: person, place, time/date, situation, day of week, month of year and year. Her memory, attention,  language and knowledge are fairly well intact. There is no aphasia, agnosia, apraxia or anomia. There is a no significant degree of bradyphrenia. Speech is mildly hypophonic with no dysarthria noted. Mood is congruent and affect is normal.   Cranial nerves are as described above under HEENT exam. In addition, shoulder shrug is normal with equal shoulder height noted.  Motor exam: Normal bulk, and strength for age is noted. There are no dyskinesias noted.  Tone is moderately rigid with presence of cogwheeling in the bilateral upper extremities, left worse than right. There is overall moderate bradykinesia. There is no drift or rebound.  There is a minimal intermittent resting tremor in the left upper extremity and no resting tremor elsewhere. The tremor is intermittent.  Romberg is not tested today.  Reflexes are 1+ in the upper extremities and 1+ in the lower extremities.   Fine motor skills exam: Finger taps are moderately impaired on the right and moderately to severely impaired on the left. Hand movements are mildly impaired on the right and moderately impaired on the left. RAP (rapid alternating patting) is moderately impaired on the right and moderately impaired on the left. Foot taps are moderately impaired on the right and moderately to severely impaired on the left. Foot agility (in the form of heel stomping) is moderately impaired on the right and moderately impaired on the left.    Cerebellar testing shows no dysmetria or intention tremor on finger to nose testing. Heel to shin is unremarkable bilaterally, but she has decrease in range of motion in her right hip. There is no truncal or gait ataxia.   Sensory exam is intact to light touch, PP and temperature sense..   Gait, station and balance: She stands up from the seated position with mild difficulty and does need to push up with Her hands. She needs no assistance. No veering to one side is noted. She is not noted to lean to the side.  Posture is moderately stooped with kyphoscoliosis noted. Stance is wide-based. She walks with decrease in stride length and pace and uses her 4 wheeled walker well. She turns in about 5 steps. She has no freezing. She has mild start hesitation.     Assessment and Plan:   In summary, Pierrette Scheu is a very pleasant 80 year old female with a history of advanced Parkinson's disease, left-sided predominant, complicated by gait disorder, abnormal posture, RBD, LE edema, and constipation. Her physical exam is slightly worse. Her lower extremity swelling is stable. She has been reluctant to increase the Sinemet. When she discontinued it in the past she felt worse however. Her sister has been on Sinemet and has developed dyskinesias and the patient is worried that she will have the same side effects. She has been reluctant to make changes to her medications. She will try to use her lower leg compression hoses now that it is not hot any longer outside. She is encouraged to drink more water and eat nutritious food. For her constipation she is on an over-the-counter stool softener and occasionally uses MiraLAX. Her memory she feels is stable and I agree. Her mood is stable. She is advised to stay active physically and mentally and use her walker and walk regularly. I do see mild motor progression but we will defer making changes to her medications this time as she is again reluctant. Nevertheless, we may decide to decrease her Requip next time and increase her Sinemet to one full pill 3 times a day. I would like to reduce her Requip to 2 mg 3 times a day. I did not give her any refills today as she did not require any. I will see her back routinely in 6 months, sooner if the need arises. I encouraged them to call with any interim questions, concerns, or problems.   I answered all their questions today and the  patient and her husband were in agreement with the above outlined plan.

## 2014-01-26 NOTE — Patient Instructions (Signed)
I think your Parkinson's disease has remained fairly stable, which is reassuring. Nevertheless, as you know, this disease does progress with time. It can affect your balance, your memory, your mood, your bowel and bladder function, your posture, balance and walking. Overall you are doing fairly well but I do want to suggest a few things today:  Remember to drink plenty of fluid, eat healthy meals and do not skip any meals. Try to eat protein with a every meal and eat a healthy snack such as fruit or nuts in between meals. Try to keep a regular sleep-wake schedule and try to exercise daily, particularly in the form of walking, if you can.   Taking your medication on schedule is key.   Try to stay active physically and mentally. Engage in social activities in your community and with your family and try to keep up with current events by reading the newspaper or watching the news. Try to do word puzzles and you may like to do word puzzles and brain games on the computer such as on [email or web address].   As far as your medications are concerned, I would like to suggest that you take your current medication with the following additional changes: no changes today, but we may reduce your ropinirole next time and increase your levodopa some.     As far as diagnostic testing, I will order: no new test.   I would like to see you back in 6 months, sooner if we need to. Please call us with any interim questions, concerns, problems, updates or refill requests.  Please also call us for any test results so we can go over those with you on the phone. Our nursing staff will answer any of your questions and relay your messages to me and also relay most of my messages to you.

## 2014-01-31 ENCOUNTER — Other Ambulatory Visit: Payer: Self-pay

## 2014-01-31 DIAGNOSIS — G20 Parkinson's disease: Secondary | ICD-10-CM

## 2014-01-31 MED ORDER — CARBIDOPA-LEVODOPA 25-100 MG PO TABS: 0.5000 | ORAL_TABLET | Freq: Three times a day (TID) | ORAL | Status: DC

## 2014-04-19 ENCOUNTER — Telehealth: Payer: Self-pay | Admitting: Neurology

## 2014-04-19 NOTE — Telephone Encounter (Signed)
Taking 1 teaspoon of vinegar for arthritis should not affect her Parkinson's medicine. Nevertheless, I would advise that she does not take the vinegar along with her Parkinson's medicine at the same time and take it at different time from her Parkinson's medication, just to be on the safe side.

## 2014-04-19 NOTE — Telephone Encounter (Signed)
Patient is calling because yesterday she started taking 1 teaspoon of vinegar for arthritis and wanted to know if this would affect her Parkinson's medicine. Please call and advise. Thank you.

## 2014-04-19 NOTE — Telephone Encounter (Signed)
Spoke with patient, shared Dr Teofilo PodAthar's response to question on vinegar and parkinson's and patient verbalized understanding and said ok

## 2014-04-28 ENCOUNTER — Telehealth: Payer: Self-pay | Admitting: Neurology

## 2014-04-28 NOTE — Telephone Encounter (Signed)
Please call patient and advise her that it may be difficult to say what exactly is going on. Could be that her swelling is worse in her legs which makes it more difficult to walk. I had asked her to increase the Sinemet and reduce her Requip last time when she was in but she has been reluctant to change her medication regimen. If she wants to I can certainly see her back for a sooner appointment. Please offer her a sooner appointment for sometime in the next couple of weeks if she agrees.

## 2014-04-28 NOTE — Telephone Encounter (Signed)
Pt is calling stating that her legs are bothering her she can hardly walk.  She wants to know if this is a part of parkinson's.  If it's not due to parkinsons she will make an appointment with her PCP.Please call and advise.

## 2014-05-01 NOTE — Telephone Encounter (Signed)
Shared Dr Teofilo PodAthar's message with patient, she verbalized understanding and has schedule for sooner appointment 05/02/14

## 2014-05-02 ENCOUNTER — Ambulatory Visit (INDEPENDENT_AMBULATORY_CARE_PROVIDER_SITE_OTHER): Payer: Medicare Other | Admitting: Neurology

## 2014-05-02 ENCOUNTER — Encounter: Payer: Self-pay | Admitting: Neurology

## 2014-05-02 VITALS — BP 115/65 | HR 82 | Temp 97.7°F | Wt 139.0 lb

## 2014-05-02 DIAGNOSIS — M79604 Pain in right leg: Secondary | ICD-10-CM

## 2014-05-02 DIAGNOSIS — G20 Parkinson's disease: Secondary | ICD-10-CM

## 2014-05-02 DIAGNOSIS — R600 Localized edema: Secondary | ICD-10-CM

## 2014-05-02 NOTE — Patient Instructions (Signed)
I don't believe your right leg pain is due to your Parkinson's, but may be due to joint pain from R hip degenerative arthritis or low back pain, radiating to the R groin. Try ibuprofen 400 mg once daily for 3 days and see if you feel better. If not, please see your medical doctor. You may need to see your orthopedic doctor.

## 2014-05-02 NOTE — Progress Notes (Signed)
Subjective:    Patient ID: Michelle Beck is a 81 y.o. female.  HPI     Interim history:   Michelle Beck is an 81 year old right-handed woman with an underlying medical history of hyperlipidemia, hypertension, chronic back, s/p back surgery, hip pain, s/p R hip surgery in 2009, scoliosis, osteoporosis, compression fractures and rib fractures, s/p hernia surgery on the right in 2002, who presents for followup consultation of her left-sided predominant Parkinson's disease of approximately 8-9 years' duration, complicated by lower extremity edema, sialorrhea (which did not respond to botulinum toxin injection in the past), falls and RBD and overall frailty with advanced age. She is accompanied by her husband again today. I last saw her on 01/26/2014, at which time she reported more slowness and her speech was soft. She was bothered by low back pain but had not seen a back specialist. She had tried lower back injections in the past which she felt were not helpful. Physical therapy also did not help much. She was on Requip 3 mg 3 times a day. She did not try her leg compression stockings because she felt it was too hot in the summer. I suggested that we consider reduction in her Requip because of her lower extremity swelling and try to increase her Sinemet. She was reluctant to pursue this at the time. She called back reporting that she felt worse with her legs and that she could hardly walk. I suggested she come in for a sooner than scheduled appointment.    Today, she reports that for the past few weeks she has had intermittent right leg pain. She describes an aching sensation in her right thigh but also groin pain and discomfort at the outer aspect of her right hip. She also has ongoing low back pain and it seems like it may be radiating to the right. She has thankfully not fallen. She does not notice any more freezing or tremors or weakness. She does feel that she is favoring the right leg because it hurts  and she feels like she is limping. She had seen orthopedics in the past. She had a right hip fracture and had a partial hip replacement. She has taken 200 mg of ibuprofen recently once. She has also been resting more and her pain improved. She tried heat pad for the lower back which did not help. Her lower extremity swelling is better and is usually better when it is cold outside and in the winter time.   I saw her on 07/26/2013, at which time she reported feeling stable. She was using a cane around the house. She reported some back pain. She was sleeping well. She denied no major issues with her memory, exacerbation of RBD, no depression but had some anxiety, no delusions, some hallucinations and no dyskinesias. She reported no recent falls. I did not make any changes to her medications as I felt she was fairly stable. I did worry about her lower extremity edema which I felt was due to the Requip. I asked her to elevate her feet and use compression hose is up to her knees during the day. I talked her about potentially reducing Requip down the road and starting her on Sinemet but she was reluctant to try this for fear of side effects with Sinemet.  I saw her on 03/07/2013, at which time I felt her lower extremity edema was improved. She reported constipation from Sinemet and did not endorse much in the way of improvement with Sinemet and therefore  we decided to taper her off of it. She was to continue with amantadine and Requip. 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 I considered using Linzess for her constipation down the Road. In the interim, she called back and January of this year and requested to restart Sinemet as she felt that she was worse without it.   I first met her on 12/07/2012 at which time I was worried about her lower extremity edema and I encouraged her to continue using compression stockings to her lower extremities and elevate her legs if possible. I referred  her to a vascular specialist. I talked to her about reducing Requip but she was reluctant. I introduced a cautious dose of Sinemet at the time started with half a pill twice daily and with build up to half a pill 3 times a day. I wondered if she had developed livedo reticularis on amantadine.   She did not go to see the vein specialist. She uses her cane more than the walker.   I first met her on 08/24/2012, which time I did not add any new test or make any medication changes. I did talk to her about her lower extremity swelling and explained that the Requip may be to blame in part for that. She has a family history of Parkinson's disease and her her daughter has been diagnosed with young onset Parkinson's disease recently.   The patient previously followed with Dr. Morene Antu and was last seen by him on 04/27/2012, at which time he felt that she was stable and he initiated a trial of Cymbalta for her back pain, but she stopped it as it did not help.   She was treated by Dr. Germain Osgood in [place] with Mirapex without benefit. In December 2008 she started seeing Dr. Erling Cruz. She was started on Requip but she developed nausea. Amantadine was added. She denied hallucinations or memory loss and has had bizarre dreams and symptoms suggestive of RBD. She had drooling which did not improve with Botox injections. She exercises regularly. She has had multiple falls. In May 2012 she fell and fractured the right hip requiring surgery. She also fractured her right femur in June 2012. She has fracture 2 vertebral bodies in January 2013 requiring surgery. She had a TENS unit and epidural injections and Lidoderm patches without benefit.     Her Past Medical History Is Significant For: Past Medical History  Diagnosis Date  . Closed fracture of three ribs   . Closed fracture of unspecified part of vertebral column without mention of spinal cord injury   . Osteoporosis, unspecified   . Closed fracture of unspecified  part of neck of femur   . Unspecified disorder of plasma protein metabolism   . Disturbance of salivary secretion   . Pain in thoracic spine   . Paralysis agitans   . Unspecified adverse effect of unspecified drug, medicinal and biological substance   . Scoliosis (and kyphoscoliosis), idiopathic     Her Past Surgical History Is Significant For: Past Surgical History  Procedure Laterality Date  . Hernia repair  2002    Her Family History Is Significant For: Family History  Problem Relation Age of Onset  . Heart failure Mother   . Heart disease Mother   . Emphysema Father   . COPD Father   . Diabetes Sister     Her Social History Is Significant For: History   Social History  . Marital Status: Married  Spouse Name: N/A    Number of Children: N/A  . Years of Education: N/A   Occupational History  .      retired   Social History Main Topics  . Smoking status: Never Smoker   . Smokeless tobacco: Never Used  . Alcohol Use: No  . Drug Use: No  . Sexual Activity: None   Other Topics Concern  . None   Social History Narrative    Her Allergies Are:  Allergies  Allergen Reactions  . Fentanyl Anxiety and Shortness Of Breath  . Actonel [Risedronate Sodium]     Reaction unknown  . Hydrocodone     Reaction unknown  . Iodine   . Penicillins   . Shellfish Allergy     Reaction unknown  :   Her Current Medications Are:  Outpatient Encounter Prescriptions as of 05/02/2014  Medication Sig  . amantadine (SYMMETREL) 100 MG capsule Take 1 capsule (100 mg total) by mouth daily.  Marland Kitchen amLODipine (NORVASC) 5 MG tablet Take 5 mg by mouth daily. 1/2 tablet daily  . calcium citrate (CALCITRATE - DOSED IN MG ELEMENTAL CALCIUM) 950 MG tablet Take 1 tablet by mouth 3 (three) times daily.  . carbidopa-levodopa (SINEMET IR) 25-100 MG per tablet Take 0.5 tablets by mouth 3 (three) times daily.  . cholecalciferol (VITAMIN D) 400 UNITS TABS Take 400 Units by mouth 3 (three) times daily.   Marland Kitchen docusate calcium (SURFAK) 240 MG capsule Take 240 mg by mouth as needed for mild constipation.  . docusate sodium (COLACE) 100 MG capsule Take 100 mg by mouth 2 (two) times daily as needed for constipation.  . metoprolol tartrate (LOPRESSOR) 25 MG tablet Take 12.5 mg by mouth daily.   Marland Kitchen omeprazole (PRILOSEC) 20 MG capsule Take 20 mg by mouth daily.   . polyethylene glycol (MIRALAX / GLYCOLAX) packet Take 0.4 g/kg by mouth as needed.  Marland Kitchen rOPINIRole (REQUIP) 3 MG tablet Take 1 tablet (3 mg total) by mouth 3 (three) times daily.  . zoledronic acid (RECLAST) 5 MG/100ML SOLN injection Inject into the vein. yearly  :  Review of Systems:  Out of a complete 14 point review of systems, all are reviewed and negative with the exception of these symptoms as listed below:   Review of Systems  Musculoskeletal: Positive for gait problem.  Neurological:       Right leg pain    Objective:  Neurologic Exam  Physical Exam Physical Examination:   Filed Vitals:   05/02/14 1357  BP: 115/65  Pulse: 82  Temp: 97.7 F (36.5 C)    General Examination: The patient is a very pleasant 81 y.o. female in no acute distress. She is seated on the seat of her rolling walker.   HEENT: Normocephalic, atraumatic, pupils are equal, round and reactive to light and accommodation. Fundoscopy is normal. Extraocular tracking shows mild saccadic breakdown without nystagmus noted. There is limitation to upper gaze. There is moderate decrease in eye blink rate. Hearing is impaired. Face is symmetric with moderate facial masking and normal facial sensation. There is no lip, neck or jaw tremor. Neck is severely rigid with intact passive ROM. There are no carotid bruits on auscultation. Oropharynx exam reveals no mouth dryness. No significant airway crowding is noted. Mallampati is class II. Tongue protrudes centrally and palate elevates symmetrically. There is minimal drooling.   Chest: is clear to auscultation without  wheezing, rhonchi or crackles noted.  Heart: sounds are regular and normal without murmurs, rubs or gallops  noted.   Abdomen: is soft, non-tender and non-distended with normal bowel sounds appreciated on auscultation.  Extremities: There is trace edema  around both ankles, right more than left. There is mild chronic appearing redness in her distal lower extremities. Overall, her swelling seems improved from last time. There are no varicose veins, but she has spider veins and a purplish discoloration and findings consistent with livedo reticularis, unchanged from before.  Skin: is warm and dry with no trophic changes noted. Age-related changes are noted on the skin.   Musculoskeletal: exam reveals  mild pain in the right groin with passive mobility of the right leg. She has no knee pain bilaterally. She has no ankle pain. She has no tenderness on palpation of her muscles.   Neurologically:  Mental status: The patient is awake and alert, paying good  attention. She is able to completely provide the history.  her husband provides some details. She is oriented to: person, place, time/date, situation, day of week, month of year and year. Her memory, attention, language and knowledge are fairly well intact. There is no aphasia, agnosia, apraxia or anomia. There is a no significant degree of bradyphrenia. Speech is mildly hypophonic with no dysarthria noted. Mood is congruent and affect is normal.   Cranial nerves are as described above under HEENT exam. In addition, shoulder shrug is normal with equal shoulder height noted.  Motor exam: Normal bulk, and strength for age is noted. There are no dyskinesias noted.  Tone is moderately rigid with presence of cogwheeling in the bilateral upper extremities, left worse than right. There is overall moderate bradykinesia. There is no drift or rebound.  There is a minimal intermittent resting tremor in the left upper extremity and no resting tremor elsewhere. The  tremor is intermittent.  Romberg is not tested today.  Reflexes are 1+ in the upper extremities and 1+ in the lower extremities.   Fine motor skills exam: Finger taps are moderately impaired on the right and moderately to severely impaired on the left. Hand movements are mildly impaired on the right and moderately impaired on the left. RAP (rapid alternating patting) is moderately impaired on the right and moderately impaired on the left. Foot taps are moderately impaired on the right and moderately to severely impaired on the left. Foot agility (in the form of heel stomping) is moderately impaired on the right and moderately impaired on the left.    Cerebellar testing shows no dysmetria or intention tremor on finger to nose testing. Heel to shin is unremarkable bilaterally, but she has decrease in range of motion in her right hip. There is no truncal or gait ataxia.   Sensory exam is intact to light touch, PP and temperature sense.  Gait, station and balance: She stands up from the seated position with mild to moderate difficulty and does need to push up with Her hands. She needs no assistance. No veering to one side is noted. She is not noted to lean to the side. Posture is moderately stooped with kyphoscoliosis noted. Stance is wide-based. She walks with decrease in stride length and pace and uses her 4 wheeled walker well. She turns in about 5 steps. She has no freezing. She has mild start hesitation.     Assessment and Plan:   In summary, Michelle Beck is a very pleasant 81 year old female with a history of advanced Parkinson's disease, left-sided predominant, complicated by gait disorder, abnormal posture, RBD, LE edema, and constipation. Her physical exam is slightly  worse. Her lower extremity swelling is better. She presents for sooner than scheduled appointment for right leg pain. I do not believe this is Parkinson's related however. I explained to the patient and her husband that it could be  joint related coming from the hip or lower back related radiating to the right leg. It is difficult to tease out for me at this time. She certainly does have pain that seems to be concentrated around the right hip area. She is encouraged to try ibuprofen 400 mg daily for 3 days and see if that helps. I would not suggest that she take an anti-inflammatory medication daily for to long because she does have a history of indigestion and reflux disease. I suggested she make an appointment with her medical doctor if she does not improve in the next few days. She may need to see orthopedics. I did not suggest any changes to her medications. I will see her back for her scheduled appointment in 3 months from now. She is encouraged to call with any interim questions. The patient and her husband were in agreement.

## 2014-07-16 NOTE — Op Note (Signed)
PATIENT NAME:  Michelle Beck, Michelle Beck MR#:  [id number] DATE OF BIRTH:  [birth year]  DATE OF PROCEDURE:  03/28/2011  PREOPERATIVE DIAGNOSIS: Compression fracture T12 and L2 with persistent pain.   POSTOPERATIVE DIAGNOSIS: Compression fracture T12 and L2 with persistent pain.   PROCEDURES:  1. Kyphoplasty with biopsy of T11 and injection of PMMA cement.  2. Kyphoplasty with biopsy of L2 with injection of PMMA cement.  3. Radiologic interpretation of fluoroscopically-placed trocars for biopsy and kyphoplasty of T11 and L2.   SURGEON: Nessie Nong C. Lurdes Haltiwanger, MD   ASSISTANT: None.   ANESTHESIA: MAC.   ESTIMATED BLOOD LOSS: Negligible.   COMPLICATIONS: None.   SPECIMENS SENT: Tissue from T11 and L2.   BRIEF CLINICAL NOTE AND PATHOLOGY: The patient had persistent back pain unresponsive to conservative treatment. Options, risks, and benefits were discussed, and she elected to proceed with operative intervention. At the time of the procedure, good specimen was obtained.   DESCRIPTION OF PROCEDURE: Preop antibiotics, adequate MAC anesthesia, prone position, routine prepping and draping. Appropriate timeout was called. The vertebral bodies were appropriately identified and isolated. The vertebral bodies were instrumented  from the right side through the pedicle into the vertebral body. Bone specimen was obtained. The balloons were inserted and inflated. The balloons were then removed and the PMMA cement was injected. There was no evidence of significant extravasation. The patient's bone was significantly osteoporotic, and visualization was a little bit more difficult than normal.  Cannulas were cleaned and removed. The incisions were closed with Monocryl. Soft sterile dressing was applied. The patient was awakened and taken to the postanesthesia care unit having tolerated the procedure well.    ____________________________ Winn JockJames C. Gerrit Heckaliff, MD jcc:cbb D: 03/28/2011 10:44:34 ET T: 03/28/2011 11:45:15  ET JOB#: [id number]  cc: Winn JockJames C. Gerrit Heckaliff, MD, <Dictator> Winn JockJAMES C Ketan Renz MD ELECTRONICALLY SIGNED 04/10/2011 16:05

## 2014-07-16 NOTE — Op Note (Signed)
PATIENT NAME:  Michelle Beck, Michelle Beck MR#:  [id number] DATE OF BIRTH:  01-31-1930  DATE OF PROCEDURE:  06/10/2011  PREOPERATIVE DIAGNOSIS: T10 compression fracture with intractable pain.   POSTOPERATIVE DIAGNOSIS: T10 compression fracture with intractable pain.   PROCEDURES:  1. T10 kyphoplasty with biopsy and injection of PMMA cement.  2. Radiologic interpretation for fluoroscopically placed trocar for kyphoplasty and biopsy of T10.   SURGEON: Glendi Mohiuddin C. Jarrin Staley, Beck.D.   ASSISTANT: None.   ANESTHESIA: MAC.   ESTIMATED BLOOD LOSS: Negligible.   COMPLICATIONS: None.   BRIEF CLINICAL NOTE AND PATHOLOGY: The patient had prior kyphoplasties, one at T11. She did well, and then developed increasing pain. Work-up showed evidence of further compression of T10. Options, risks, and benefits were discussed and she elected to proceed with operative intervention. At time of the procedure, a good bone specimen was obtained. The vertebral body was very soft and did expand somewhat.   DESCRIPTION OF PROCEDURE: Preop antibiotics, adequate MAC anesthesia, prone position, all prominences well padded. Routine prepping and draping. Appropriate time out was called. The T10 vertebral body was identified and pedicles appropriately aligned. AP and lateral views were used. The vertebral body was then instrumented through the right side in routine fashion using fluoroscopic guidance. The trocar advanced nicely through the pedicle into the vertebral body. The biopsy was obtained. The balloon was then inserted and inflated using fluoroscopic guidance. It expanded the vertebral body and filled it nicely. The balloon was then removed and cement was injected when it was of the appropriate consistency. No evidence of significant extravasation. Cannula was cleaned and removed. Incision was closed with Monocryl. A Band-Aid was applied. The patient was awakened and taken to the PAC-U having tolerated the procedure  well. ____________________________ Winn JockJames C. Gerrit Heckaliff, MD jcc:slb D: 06/11/2011 06:47:15 ET T: 06/11/2011 11:06:20 ET JOB#: [id number]  cc: Winn JockJames C. Gerrit Heckaliff, MD, <Dictator> Winn JockJAMES C Tam Savoia MD ELECTRONICALLY SIGNED 06/13/2011 6:07

## 2014-07-31 ENCOUNTER — Ambulatory Visit (INDEPENDENT_AMBULATORY_CARE_PROVIDER_SITE_OTHER): Payer: Medicare Other | Admitting: Neurology

## 2014-07-31 ENCOUNTER — Encounter: Payer: Self-pay | Admitting: Neurology

## 2014-07-31 VITALS — BP 132/78 | HR 80 | Resp 14 | Ht <= 58 in | Wt 148.0 lb

## 2014-07-31 DIAGNOSIS — L819 Disorder of pigmentation, unspecified: Secondary | ICD-10-CM

## 2014-07-31 DIAGNOSIS — R600 Localized edema: Secondary | ICD-10-CM

## 2014-07-31 DIAGNOSIS — G20 Parkinson's disease: Secondary | ICD-10-CM | POA: Diagnosis not present

## 2014-07-31 DIAGNOSIS — M79604 Pain in right leg: Secondary | ICD-10-CM | POA: Diagnosis not present

## 2014-07-31 MED ORDER — CARBIDOPA-LEVODOPA 25-100 MG PO TABS: 1.0000 | ORAL_TABLET | Freq: Three times a day (TID) | ORAL | Status: DC

## 2014-07-31 NOTE — Progress Notes (Signed)
Subjective:    Patient ID: Michelle Beck is a 81 y.o. female.  HPI     Interim history:   Michelle Beck is an 81 year old right-handed woman with an underlying medical history of hyperlipidemia, hypertension, chronic back, s/p back surgery, hip pain, s/p R hip surgery in 2009, scoliosis, osteoporosis, compression fractures and rib fractures, s/p hernia surgery on the right in 2002, who presents for followup consultation of her left-sided predominant Parkinson's disease of approximately 8-9 years' duration, complicated by lower extremity edema, sialorrhea (which did not respond to botulinum toxin injection in the past), falls, RBD and overall frailty with advanced age. She is accompanied by her husband again today. I last saw her on 05/02/2014, at which time she reported intermittent right leg pain. She described an aching sensation in her right thigh but also groin pain and discomfort at the outer aspect of her right hip. She had ongoing low back pain, radiating to the right. She had not fallen. She had seen orthopedics in the past. She had a right hip fracture and had a partial hip replacement. Her lower extremity swelling was better. I asked her to continue with her Parkinson's medication. I advised her to see her primary care physician if her right hip pain did not improve with ibuprofen.   Today, 07/31/2014: She is able to provide some of her history herself and her husband provides some details. She reports that she is more stiff, has had more constipation, more drooling. She has had more right hip pain after using a pedaling device that her son bought for her. The first day she used it for 15 minutes and then the next day she used it again for 15 minutes and started having pain. Since then she has used it only once more. She continues to take amantadine 100 mg once daily, Sinemet half a pill 3 times a day and Requip 3 mg 3 times a day. She has some lower extremity edema. Her memory is stable.  Thankfully she has not fallen.  Previously:   I saw her on 01/26/2014, at which time she reported more slowness and her speech was soft. She was bothered by low back pain but had not seen a back specialist. She had tried lower back injections in the past which she felt were not helpful. Physical therapy also did not help much. She was on Requip 3 mg 3 times a day. She did not try her leg compression stockings because she felt it was too hot in the summer. I suggested that we consider reduction in her Requip because of her lower extremity swelling and try to increase her Sinemet. She was reluctant to pursue this at the time. She called back reporting that she felt worse with her legs and that she could hardly walk. I suggested she come in for a sooner than scheduled appointment.   I saw her on 07/26/2013, at which time she reported feeling stable. She was using a cane around the house. She reported some back pain. She was sleeping well. She denied no major issues with her memory, exacerbation of RBD, no depression but had some anxiety, no delusions, some hallucinations and no dyskinesias. She reported no recent falls. I did not make any changes to her medications as I felt she was fairly stable. I did worry about her lower extremity edema which I felt was due to the Requip. I asked her to elevate her feet and use compression hose is up to her knees during the  day. I talked her about potentially reducing Requip down the road and starting her on Sinemet but she was reluctant to try this for fear of side effects with Sinemet.  I saw her on 03/07/2013, at which time I felt her lower extremity edema was improved. She reported constipation from Sinemet and did not endorse much in the way of improvement with Sinemet and therefore we decided to taper her off of it. She was to continue with amantadine and Requip. 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 I considered using Linzess for her  constipation down the Road. In the interim, she called back and January of this year and requested to restart Sinemet as she felt that she was worse without it.  I first met her on 12/07/2012 at which time I was worried about her lower extremity edema and I encouraged her to continue using compression stockings to her lower extremities and elevate her legs if possible. I referred her to a vascular specialist. I talked to her about reducing Requip but she was reluctant. I introduced a cautious dose of Sinemet at the time started with half a pill twice daily and with build up to half a pill 3 times a day. I wondered if she had developed livedo reticularis on amantadine.   She did not go to see the vein specialist. She uses her cane more than the walker.    I first met her on 08/24/2012, which time I did not add any new test or make any medication changes. I did talk to her about her lower extremity swelling and explained that the Requip may be to blame in part for that. She has a family history of Parkinson's disease and her her daughter has been diagnosed with young onset Parkinson's disease recently.    The patient previously followed with Dr. Morene Antu and was last seen by him on 04/27/2012, at which time he felt that she was stable and he initiated a trial of Cymbalta for her back pain, but she stopped it as it did not help.   She was treated by Dr. Germain Osgood in [place] with Mirapex without benefit. In December 2008 she started seeing Dr. Erling Cruz. She was started on Requip but she developed nausea. Amantadine was added. She denied hallucinations or memory loss and has had bizarre dreams and symptoms suggestive of RBD. She had drooling which did not improve with Botox injections. She exercises regularly. She has had multiple falls. In May 2012 she fell and fractured the right hip requiring surgery. She also fractured her right femur in June 2012. She has fracture 2 vertebral bodies in January 2013  requiring surgery. She had a TENS unit and epidural injections and Lidoderm patches without benefit.    Her Past Medical History Is Significant For: Past Medical History  Diagnosis Date  . Closed fracture of three ribs   . Closed fracture of unspecified part of vertebral column without mention of spinal cord injury   . Osteoporosis, unspecified   . Closed fracture of unspecified part of neck of femur   . Unspecified disorder of plasma protein metabolism   . Disturbance of salivary secretion   . Pain in thoracic spine   . Paralysis agitans   . Unspecified adverse effect of unspecified drug, medicinal and biological substance   . Scoliosis (and kyphoscoliosis), idiopathic     Her Past Surgical History Is Significant For: Past Surgical History  Procedure Laterality Date  . Hernia repair  2002    Her Family History Is Significant For: Family History  Problem Relation Age of Onset  . Heart failure Mother   . Heart disease Mother   . Emphysema Father   . COPD Father   . Diabetes Sister     Her Social History Is Significant For: History   Social History  . Marital Status: Married    Spouse Name: N/A  . Number of Children: N/A  . Years of Education: N/A   Occupational History  .      retired   Social History Main Topics  . Smoking status: Never Smoker   . Smokeless tobacco: Never Used  . Alcohol Use: No  . Drug Use: No  . Sexual Activity: Not on file   Other Topics Concern  . None   Social History Narrative    Her Allergies Are:  Allergies  Allergen Reactions  . Fentanyl Anxiety and Shortness Of Breath  . Actonel [Risedronate Sodium]     Reaction unknown  . Hydrocodone     Reaction unknown  . Iodine   . Penicillins   . Shellfish Allergy     Reaction unknown  :   Her Current Medications Are:  Outpatient Encounter Prescriptions as of 07/31/2014  Medication Sig  . amantadine (SYMMETREL) 100 MG capsule Take 1 capsule (100 mg total) by mouth daily.  Marland Kitchen  amLODipine (NORVASC) 5 MG tablet Take 5 mg by mouth daily. 1/2 tablet daily  . calcium citrate (CALCITRATE - DOSED IN MG ELEMENTAL CALCIUM) 950 MG tablet Take 1 tablet by mouth 3 (three) times daily.  . carbidopa-levodopa (SINEMET IR) 25-100 MG per tablet Take 0.5 tablets by mouth 3 (three) times daily.  . cholecalciferol (VITAMIN D) 400 UNITS TABS Take 400 Units by mouth 3 (three) times daily.  Marland Kitchen docusate sodium (COLACE) 100 MG capsule Take 100 mg by mouth 2 (two) times daily as needed for constipation.  . metoprolol tartrate (LOPRESSOR) 25 MG tablet Take 12.5 mg by mouth daily.   Marland Kitchen omeprazole (PRILOSEC) 20 MG capsule Take 20 mg by mouth daily.   . polyethylene glycol (MIRALAX / GLYCOLAX) packet Take 0.4 g/kg by mouth as needed.  Marland Kitchen rOPINIRole (REQUIP) 3 MG tablet Take 1 tablet (3 mg total) by mouth 3 (three) times daily.  . zoledronic acid (RECLAST) 5 MG/100ML SOLN injection Inject into the vein. yearly  . [DISCONTINUED] docusate calcium (SURFAK) 240 MG capsule Take 240 mg by mouth as needed for mild constipation.   No facility-administered encounter medications on file as of 07/31/2014.  :  Review of Systems:  Out of a complete 14 point review of systems, all are reviewed and negative with the exception of these symptoms as listed below:   Review of Systems  Neurological:       Increased leg weakness and pain     Objective:  Neurologic Exam  Physical Exam Physical Examination:   Filed Vitals:   07/31/14 1105  BP: 132/78  Pulse: 80  Resp: 14   General Examination: The patient is a very pleasant 81 y.o. female in no acute distress. She is seated on the seat of her rolling walker.   HEENT: Normocephalic, atraumatic, pupils are equal, round and reactive to light and accommodation. Fundoscopy is normal. Extraocular tracking shows mild saccadic breakdown without nystagmus noted. There is limitation to upper gaze. There is moderate decrease in eye blink rate. Hearing is impaired. Face  is symmetric with moderate facial masking and normal facial sensation. There is  no lip, neck or jaw tremor. Neck is severely rigid with intact passive ROM. There are no carotid bruits on auscultation. Oropharynx exam reveals no mouth dryness. No significant airway crowding is noted. Mallampati is class II. Tongue protrudes centrally and palate elevates symmetrically. There is minimal drooling.   Chest: is clear to auscultation without wheezing, rhonchi or crackles noted.  Heart: sounds are regular and normal without murmurs, rubs or gallops noted.   Abdomen: is soft, non-tender and non-distended with normal bowel sounds appreciated on auscultation.  Extremities: There is 1 + edema  around both ankles. There is mild chronic appearing redness in her distal lower extremities. There are no varicose veins, but she has spider veins and a purplish discoloration and findings consistent with livedo reticularis, unchanged from before.  Skin: is warm and dry with no trophic changes noted. Age-related changes are noted on the skin.   Musculoskeletal: exam reveals  mild pain in the right groin with passive mobility of the right leg. She has no knee pain bilaterally. She has no ankle pain. She has no tenderness on palpation of her muscles.   Neurologically:  Mental status: The patient is awake and alert, paying good attention. She is able to completely provide the history.  her husband provides some details. She is oriented to: person, place, time/date, situation, day of week, month of year and year. Her memory, attention, language and knowledge are fairly well intact. There is no aphasia, agnosia, apraxia or anomia. There is a no significant degree of bradyphrenia. Speech is mildly hypophonic with no dysarthria noted. Mood is congruent and affect is normal.   Cranial nerves are as described above under HEENT exam. In addition, shoulder shrug is normal with equal shoulder height noted.  Motor exam: Normal bulk,  and strength for age is noted. There are no dyskinesias noted.  Tone is moderately rigid with presence of cogwheeling in the bilateral upper extremities, left worse than right. There is overall moderate bradykinesia. There is no drift or rebound.  There is a minimal intermittent resting tremor in the left upper extremity and no resting tremor elsewhere. The tremor is intermittent.  Romberg is not tested today.  Reflexes are 1+ in the upper extremities and 1+ in the lower extremities.   Fine motor skills exam: Finger taps are moderately impaired on the right and moderately to severely impaired on the left. Hand movements are mildly impaired on the right and moderately impaired on the left. RAP (rapid alternating patting) is moderately impaired on the right and moderately impaired on the left. Foot taps are moderately impaired on the right and moderately to severely impaired on the left. Foot agility (in the form of heel stomping) is moderately impaired on the right and moderately impaired on the left.    Cerebellar testing shows no dysmetria or intention tremor on finger to nose testing. Heel to shin is unremarkable bilaterally, but she has decrease in range of motion in her right hip. There is no truncal or gait ataxia.   Sensory exam is intact to light touch, PP and temperature sense.  Gait, station and balance: She stands up from the seated position with mild to moderate difficulty and does need to push up with Her hands. She needs no assistance, but takes 3 attempts. No veering to one side is noted. She is not noted to lean to the side. Posture is moderately stooped with kyphoscoliosis noted. Stance is wide-based. She walks with decrease in stride length and pace and uses  her 4 wheeled walker well. She turns in about 5 steps. She has no freezing. She has mild start hesitation.     Assessment and Plan:   In summary, Adlean Hardeman is a very pleasant 81 year old female with a history of advanced  Parkinson's disease, left-sided predominant, complicated by gait disorder, abnormal posture, RBD, LE edema, and constipation. Her physical exam is fairly stable from last time, swelling a little worse from last time. I suggested she use her pedaling device not 15 minutes but more like 5 minutes at a time. She is strongly advised to drink more water to combat constipation. She made only drink about 3 glasses a day according to her husband. I would like to eliminate and amantadine. She's agreeable to trying. In addition, would like for her to increase her Sinemet to a whole pill 3 times a day. We will keep Requip the same. She is advised to use compression stockings over-the-counter for her leg swelling.  I will see her back in about 4 months from now, sooner if the need arises. I answered all her questions today and the patient and her husband were in agreement. I adjusted her Sinemet prescription.  I spent 20 minutes in total face-to-face time with the patient, more than 50% of which was spent in counseling and coordination of care, reviewing test results, reviewing medication and discussing or reviewing the diagnosis of PD, its prognosis and treatment options.

## 2014-07-31 NOTE — Patient Instructions (Signed)
1. We will stop the amantadine.   2. Increase your sinemet to 1 pill 3 times a day.   3. We will keep the requip at 3 mg 3 times a day.   4. You need to drink more water.   5. Use your pedaling device 5 minutes at a time.   6. Follow up in 4 months.

## 2014-08-16 ENCOUNTER — Telehealth: Payer: Self-pay | Admitting: Neurology

## 2014-08-16 NOTE — Telephone Encounter (Signed)
Pt called regarding carbidopa-levodopa (SINEMET IR) 25-100 MG per tablet. Since increasing she feels bloating, swelling, can't pass gas, constipated, nauseated since Saturday 08/12/14.Marland Kitchen. Please call and advise. Pt can be reached at [phone].

## 2014-08-16 NOTE — Telephone Encounter (Signed)
Michelle Beck states that she started taking 1 tab (carb/levo) TID on Saturday. Before she was taking 1/2 tab TID. She reports the problems below and stating she is "whoozie headed". Recommendation?

## 2014-08-16 NOTE — Telephone Encounter (Signed)
Please call patient back. Please ask her to reduce Sinemet again to half a tablet 3 times a day and see if she feels better. She may be having side effects from the increased dose. Please ask her to update us in the next week or 2.

## 2014-08-17 ENCOUNTER — Other Ambulatory Visit: Payer: Self-pay

## 2014-08-17 DIAGNOSIS — G20 Parkinson's disease: Secondary | ICD-10-CM

## 2014-08-17 MED ORDER — CARBIDOPA-LEVODOPA 25-100 MG PO TABS: 0.5000 | ORAL_TABLET | Freq: Three times a day (TID) | ORAL | Status: DC

## 2014-08-17 NOTE — Telephone Encounter (Signed)
We may re-consider amantadine again, but let's do one change at a time.

## 2014-08-17 NOTE — Telephone Encounter (Signed)
Patient is aware of recommendation below and asks if she should go back on the amantadine?

## 2014-08-17 NOTE — Telephone Encounter (Signed)
I spoke with patient and she is aware of below recommendation. She will call back in 2 weeks to let us know how she is doing

## 2014-08-25 ENCOUNTER — Other Ambulatory Visit: Payer: Self-pay

## 2014-08-25 DIAGNOSIS — G20 Parkinson's disease: Secondary | ICD-10-CM

## 2014-08-25 MED ORDER — ROPINIROLE HCL 3 MG PO TABS: 3.0000 mg | ORAL_TABLET | Freq: Three times a day (TID) | ORAL | Status: DC

## 2014-10-24 ENCOUNTER — Other Ambulatory Visit: Payer: Self-pay | Admitting: Internal Medicine

## 2014-10-24 DIAGNOSIS — M48061 Spinal stenosis, lumbar region without neurogenic claudication: Secondary | ICD-10-CM

## 2014-10-28 ENCOUNTER — Ambulatory Visit
Admission: RE | Admit: 2014-10-28 | Discharge: 2014-10-28 | Disposition: A | Discharge status: Home / Self Care | Payer: Medicare Other | Source: Ambulatory Visit | Attending: Internal Medicine | Admitting: Internal Medicine

## 2014-10-28 DIAGNOSIS — M4806 Spinal stenosis, lumbar region: Secondary | ICD-10-CM | POA: Diagnosis present

## 2014-10-28 DIAGNOSIS — R531 Weakness: Secondary | ICD-10-CM | POA: Diagnosis present

## 2014-10-28 DIAGNOSIS — Z9889 Other specified postprocedural states: Secondary | ICD-10-CM | POA: Insufficient documentation

## 2014-10-28 DIAGNOSIS — M48061 Spinal stenosis, lumbar region without neurogenic claudication: Secondary | ICD-10-CM

## 2014-10-28 DIAGNOSIS — M5127 Other intervertebral disc displacement, lumbosacral region: Secondary | ICD-10-CM | POA: Insufficient documentation

## 2014-12-05 ENCOUNTER — Ambulatory Visit (INDEPENDENT_AMBULATORY_CARE_PROVIDER_SITE_OTHER): Payer: Medicare Other | Admitting: Neurology

## 2014-12-05 ENCOUNTER — Encounter: Payer: Self-pay | Admitting: Neurology

## 2014-12-05 VITALS — BP 142/70 | HR 72 | Resp 16 | Ht <= 58 in | Wt 156.0 lb

## 2014-12-05 DIAGNOSIS — G20 Parkinson's disease: Secondary | ICD-10-CM | POA: Diagnosis not present

## 2014-12-05 MED ORDER — CARBIDOPA-LEVODOPA 25-100 MG PO TABS: 0.5000 | ORAL_TABLET | Freq: Four times a day (QID) | ORAL | Status: DC

## 2014-12-05 NOTE — Progress Notes (Signed)
Subjective:    Patient ID: Michelle Beck is a 81 y.o. female.  HPI     Interim history:   Michelle Beck is an 81 year old right-handed woman with an underlying medical history of hyperlipidemia, hypertension, chronic back, s/p back surgery, hip pain, s/p R hip surgery in 2009, scoliosis, osteoporosis, compression fractures and rib fractures, s/p hernia surgery on the right in 2002, who presents for followup consultation of her left-sided predominant Parkinson's disease of approximately 8-9 years' duration, complicated by lower extremity edema, sialorrhea (which did not respond to botulinum toxin injection in the past), falls, RBD and overall frailty with advancing age. She is accompanied by her husband again today. I last saw her on 07/31/2014, at which time she reported feeling more stiff, she had more constipation, more drooling. She had more right hip pain after using a pedaling device that her son bought for her. The first day she used it for 15 minutes and then the next day she used it again for 15 minutes and started having pain. Since then she has used it only once more. She was on amantadine 100 mg once daily, Sinemet half a pill 3 times a day and Requip 3 mg 3 times a day. She had lower extremity edema. Her memory was stable and she had not fallen recently. On examination, her swelling was a little worse. She was advised to increase her water intake and monitor her lower extremity swelling. I suggested we taper off amantadine altogether. She was advised to increase her Sinemet to one whole pill 3 times a day. I kept the Requip the same.  Today, 12/05/2014: She reports having more difficulty with stiffness and leg weakness. She has been off of the amantadine. She tried to increase the Sinemet to 1 pill 3 times a day but felt worse with it. She struggles with nausea in the morning every day but after increasing the Sinemet to one whole pill 3 times a day she felt more jittery and went back to just  half a pill 3 times a day. She takes it typically at 9:30 in the morning, 3:30 in the afternoon, and 9:30 in the evening which is close to her bedtime. She takes Requip 3 mg 3 times a day but the first dose is earlier around 7:30. She has some sore areas in her legs. Swelling is just a little bit better. Memory and mood are stable. She has right hip pain and may need an injection into the right hip. She is not exercising very much.   Previously:  I saw her on 05/02/2014, at which time she reported intermittent right leg pain. She described an aching sensation in her right thigh but also groin pain and discomfort at the outer aspect of her right hip. She had ongoing low back pain, radiating to the right. She had not fallen. She had seen orthopedics in the past. She had a right hip fracture and had a partial hip replacement. Her lower extremity swelling was better. I asked her to continue with her Parkinson's medication. I advised her to see her primary care physician if her right hip pain did not improve with ibuprofen.    I saw her on 01/26/2014, at which time she reported more slowness and her speech was soft. She was bothered by low back pain but had not seen a back specialist. She had tried lower back injections in the past which she felt were not helpful. Physical therapy also did not help much. She was  on Requip 3 mg 3 times a day. She did not try her leg compression stockings because she felt it was too hot in the summer. I suggested that we consider reduction in her Requip because of her lower extremity swelling and try to increase her Sinemet. She was reluctant to pursue this at the time. She called back reporting that she felt worse with her legs and that she could hardly walk. I suggested she come in for a sooner than scheduled appointment.   I saw her on 07/26/2013, at which time she reported feeling stable. She was using a cane around the house. She reported some back pain. She was sleeping  well. She denied no major issues with her memory, exacerbation of RBD, no depression but had some anxiety, no delusions, some hallucinations and no dyskinesias. She reported no recent falls. I did not make any changes to her medications as I felt she was fairly stable. I did worry about her lower extremity edema which I felt was due to the Requip. I asked her to elevate her feet and use compression hose is up to her knees during the day. I talked her about potentially reducing Requip down the road and starting her on Sinemet but she was reluctant to try this for fear of side effects with Sinemet.  I saw her on 03/07/2013, at which time I felt her lower extremity edema was improved. She reported constipation from Sinemet and did not endorse much in the way of improvement with Sinemet and therefore we decided to taper her off of it. She was to continue with amantadine and Requip. 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 I considered using Linzess for her constipation down the Road. In the interim, she called back and January of this year and requested to restart Sinemet as she felt that she was worse without it.  I first met her on 12/07/2012 at which time I was worried about her lower extremity edema and I encouraged her to continue using compression stockings to her lower extremities and elevate her legs if possible. I referred her to a vascular specialist. I talked to her about reducing Requip but she was reluctant. I introduced a cautious dose of Sinemet at the time started with half a pill twice daily and with build up to half a pill 3 times a day. I wondered if she had developed livedo reticularis on amantadine.   She did not go to see the vein specialist. She uses her cane more than the walker.    I first met her on 08/24/2012, which time I did not add any new test or make any medication changes. I did talk to her about her lower extremity swelling and explained that the  Requip may be to blame in part for that. She has a family history of Parkinson's disease and her her daughter has been diagnosed with young onset Parkinson's disease recently.    The patient previously followed with Dr. Morene Antu and was last seen by him on 04/27/2012, at which time he felt that she was stable and he initiated a trial of Cymbalta for her back pain, but she stopped it as it did not help.   She was treated by Dr. Germain Osgood in [place] with Mirapex without benefit. In December 2008 she started seeing Dr. Erling Cruz. She was started on Requip but she developed nausea. Amantadine was added. She denied hallucinations or memory loss and has had bizarre dreams and  symptoms suggestive of RBD. She had drooling which did not improve with Botox injections. She exercises regularly. She has had multiple falls. In May 2012 she fell and fractured the right hip requiring surgery. She also fractured her right femur in June 2012. She has fracture 2 vertebral bodies in January 2013 requiring surgery. She had a TENS unit and epidural injections and Lidoderm patches without benefit.    Her Past Medical History Is Significant For: Past Medical History  Diagnosis Date  . Closed fracture of three ribs   . Closed fracture of unspecified part of vertebral column without mention of spinal cord injury   . Osteoporosis, unspecified   . Closed fracture of unspecified part of neck of femur   . Unspecified disorder of plasma protein metabolism   . Disturbance of salivary secretion   . Pain in thoracic spine   . Paralysis agitans   . Unspecified adverse effect of unspecified drug, medicinal and biological substance   . Scoliosis (and kyphoscoliosis), idiopathic     Her Past Surgical History Is Significant For: Past Surgical History  Procedure Laterality Date  . Hernia repair  2002    Her Family History Is Significant For: Family History  Problem Relation Age of Onset  . Heart failure Mother   . Heart  disease Mother   . Emphysema Father   . COPD Father   . Diabetes Sister     Her Social History Is Significant For: Social History   Social History  . Marital Status: Married    Spouse Name: N/A  . Number of Children: N/A  . Years of Education: N/A   Occupational History  .      retired   Social History Main Topics  . Smoking status: Never Smoker   . Smokeless tobacco: Never Used  . Alcohol Use: No  . Drug Use: No  . Sexual Activity: Not Asked   Other Topics Concern  . None   Social History Narrative    Her Allergies Are:  Allergies  Allergen Reactions  . Fentanyl Anxiety and Shortness Of Breath  . Actonel [Risedronate Sodium]     Reaction unknown  . Hydrocodone     Reaction unknown  . Iodine   . Penicillins   . Shellfish Allergy     Reaction unknown  :   Her Current Medications Are:  Outpatient Encounter Prescriptions as of 12/05/2014  Medication Sig  . amLODipine (NORVASC) 5 MG tablet Take 5 mg by mouth daily. 1/2 tablet daily  . calcium citrate (CALCITRATE - DOSED IN MG ELEMENTAL CALCIUM) 950 MG tablet Take 1 tablet by mouth 3 (three) times daily.  . carbidopa-levodopa (SINEMET IR) 25-100 MG per tablet Take 0.5 tablets by mouth 4 (four) times daily. At 9:30 AM, 1:30 PM, 4:30 PM and 9:30 PM  . cholecalciferol (VITAMIN D) 400 UNITS TABS Take 400 Units by mouth 3 (three) times daily.  Marland Kitchen docusate sodium (COLACE) 100 MG capsule Take 100 mg by mouth 2 (two) times daily as needed for constipation.  . metoprolol tartrate (LOPRESSOR) 25 MG tablet Take 12.5 mg by mouth daily.   Marland Kitchen omeprazole (PRILOSEC) 20 MG capsule Take 20 mg by mouth daily.   . polyethylene glycol (MIRALAX / GLYCOLAX) packet Take 0.4 g/kg by mouth as needed.  Marland Kitchen rOPINIRole (REQUIP) 3 MG tablet Take 1 tablet (3 mg total) by mouth 3 (three) times daily.  . zoledronic acid (RECLAST) 5 MG/100ML SOLN injection Inject into the vein. yearly  . [DISCONTINUED] carbidopa-levodopa (  SINEMET IR) 25-100 MG per  tablet Take 0.5 tablets by mouth 3 (three) times daily.   No facility-administered encounter medications on file as of 12/05/2014.   Review of Systems:  Out of a complete 14 point review of systems, all are reviewed and negative with the exception of these symptoms as listed below:   Review of Systems  Neurological:       Patient denies any new concerns.   All other systems reviewed and are negative.   Objective:  Neurologic Exam  Physical Exam Physical Examination:   Filed Vitals:   12/05/14 1150  BP: 142/70  Pulse: 72  Resp: 16   General Examination: The patient is a very pleasant 81 y.o. female in no acute distress. She is seated on the seat of her rolling walker.   HEENT: Normocephalic, atraumatic, pupils are equal, round and reactive to light and accommodation. Fundoscopy is normal. Extraocular tracking shows mild saccadic breakdown without nystagmus noted. There is limitation to upper gaze. There is moderate decrease in eye blink rate. Hearing is impaired. Face is symmetric with moderate facial masking and normal facial sensation. There is no lip, neck or jaw tremor. Neck is severely rigid with intact passive ROM. There are no carotid bruits on auscultation. Oropharynx exam reveals no mouth dryness. No significant airway crowding is noted. Mallampati is class II. Tongue protrudes centrally and palate elevates symmetrically. There is minimal drooling.   Chest: is clear to auscultation without wheezing, rhonchi or crackles noted.  Heart: sounds are regular and normal without murmurs, rubs or gallops noted.   Abdomen: is soft, non-tender and non-distended with normal bowel sounds appreciated on auscultation.  Extremities: There is 1 + edema  around both ankles. There is mild chronic appearing redness in her distal lower extremities. There are no varicose veins, but she has spider veins and a purplish discoloration and findings consistent with livedo reticularis, unchanged from  before.  Skin: is warm and dry with no trophic changes noted. Age-related changes are noted on the skin.   Musculoskeletal: exam reveals R hip pain.   Neurologically:  Mental status: The patient is awake and alert, paying good attention. She is able to completely provide the history.  her husband provides some details. She is oriented to: person, place, time/date, situation, day of week, month of year and year. Her memory, attention, language and knowledge are fairly well intact. There is no aphasia, agnosia, apraxia or anomia. There is a no significant degree of bradyphrenia. Speech is mildly hypophonic with no dysarthria noted. Mood is congruent and affect is normal.   Cranial nerves are as described above under HEENT exam. In addition, shoulder shrug is normal with equal shoulder height noted.  Motor exam: Normal bulk, and strength for age is noted. There are no dyskinesias noted.  Tone is moderately rigid with presence of cogwheeling in the bilateral upper extremities, left worse than right. There is overall moderate bradykinesia. There is no drift or rebound.  There is a minimal intermittent resting tremor in the left upper extremity and no resting tremor elsewhere. The tremor is intermittent.  Romberg is not tested today.  Reflexes are 1+ in the upper extremities and 1+ in the lower extremities.   Fine motor skills exam: Finger taps are moderately impaired on the right and moderately to severely impaired on the left. Hand movements are mildly impaired on the right and moderately impaired on the left. RAP (rapid alternating patting) is moderately impaired on the right and moderately impaired  on the left. Foot taps are moderately impaired on the right and moderately to severely impaired on the left. Foot agility (in the form of heel stomping) is moderately impaired on the right and moderately impaired on the left.    Cerebellar testing shows no dysmetria or intention tremor on finger to nose  testing. Heel to shin is slightly difficult and she has decrease in range of motion in her right hip. There is no truncal or gait ataxia.   Sensory exam is intact to light touch, PP and temperature sense in the UEs and LEs.  Gait, station and balance: She stands up from the seated position with mild to moderate difficulty and does need to push up with Her hands. She needs no assistance, but takes 3 attempts. No veering to one side is noted. She is not noted to lean to the side. Posture is moderately stooped with kyphoscoliosis noted. Stance is wide-based. She walks with decrease in stride length and pace and uses her 4 wheeled walker well. She turns in about 5 steps. She has no freezing. She has mild start hesitation, seems unchanged.     Assessment and Plan:   In summary, Vernelle Wisner is a very pleasant 81 year old female with a history of advanced Parkinson's disease, left-sided predominant, complicated by gait disorder, abnormal posture, RBD, LE edema, and constipation. Her physical exam is fairly stable from last time, swelling stable as well. She reports being unable to tolerate Sinemet at one whole pill 3 times a day. She has been off of amantadine. She feels that her legs are less mobile. She has not been exercising very much and had a setback when she used her pedaling device. She has not used it since then and since we last saw her. She is encouraged to try to stay active. We will try a different approach with her Sinemet, keeping it at half a pill but increasing the frequency to 4 times a day. To that end, I have asked her to take it at 9:30 AM, 1:30 PM, 4:30 PM, and 9:30 PM. We will continue with Requip 3 mg 3 times a day. She is encouraged to drink more water.  I will see her back in about 5-6 months from now, sooner if the need arises. I answered all her questions today and the patient and her husband were in agreement. I adjusted her Sinemet prescription.  I spent 25 minutes in total  face-to-face time with the patient, more than 50% of which was spent in counseling and coordination of care, reviewing test results, reviewing medication and discussing or reviewing the diagnosis of PD, its prognosis and treatment options.

## 2014-12-05 NOTE — Patient Instructions (Addendum)
1. Let's have you take sinemet 1/2 pill 4 times a day: 9:30 AM, 1:30 PM, 4:30 PM, and 9:30 PM. 2. Keep requip at 3 mg 3 times a day.

## 2014-12-12 ENCOUNTER — Other Ambulatory Visit: Payer: Self-pay

## 2014-12-12 DIAGNOSIS — G20 Parkinson's disease: Secondary | ICD-10-CM

## 2014-12-12 MED ORDER — ROPINIROLE HCL 3 MG PO TABS: 3.0000 mg | ORAL_TABLET | Freq: Three times a day (TID) | ORAL | Status: DC

## 2014-12-16 ENCOUNTER — Emergency Department: Payer: Medicare Other

## 2014-12-16 ENCOUNTER — Encounter: Payer: Self-pay | Admitting: Emergency Medicine

## 2014-12-16 ENCOUNTER — Emergency Department
Admission: EM | Admit: 2014-12-16 | Discharge: 2014-12-16 | Disposition: A | Discharge status: Home / Self Care | Payer: Medicare Other | Attending: Emergency Medicine | Admitting: Emergency Medicine

## 2014-12-16 DIAGNOSIS — Z79899 Other long term (current) drug therapy: Secondary | ICD-10-CM | POA: Insufficient documentation

## 2014-12-16 DIAGNOSIS — Z880 Allergy status to penicillin: Secondary | ICD-10-CM | POA: Insufficient documentation

## 2014-12-16 DIAGNOSIS — I10 Essential (primary) hypertension: Secondary | ICD-10-CM | POA: Insufficient documentation

## 2014-12-16 DIAGNOSIS — M79651 Pain in right thigh: Secondary | ICD-10-CM | POA: Diagnosis not present

## 2014-12-16 DIAGNOSIS — M79605 Pain in left leg: Secondary | ICD-10-CM | POA: Diagnosis present

## 2014-12-16 DIAGNOSIS — M25551 Pain in right hip: Secondary | ICD-10-CM | POA: Diagnosis not present

## 2014-12-16 DIAGNOSIS — M5432 Sciatica, left side: Secondary | ICD-10-CM | POA: Diagnosis not present

## 2014-12-16 HISTORY — DX: Parkinson's disease: G20

## 2014-12-16 HISTORY — DX: Parkinson's disease without dyskinesia, without mention of fluctuations: G20.A1

## 2014-12-16 HISTORY — DX: Gastro-esophageal reflux disease without esophagitis: K21.9

## 2014-12-16 HISTORY — DX: Essential (primary) hypertension: I10

## 2014-12-16 MED ORDER — IBUPROFEN 400 MG PO TABS
200.0000 mg | ORAL_TABLET | Freq: Once | ORAL | Status: AC
  Administered 2014-12-16: 200 mg via ORAL
  Filled 2014-12-16: qty 1

## 2014-12-16 MED ORDER — IBUPROFEN 200 MG PO TABS: 200.0000 mg | ORAL_TABLET | Freq: Three times a day (TID) | ORAL | Status: AC | PRN

## 2014-12-16 MED ORDER — METHYLPREDNISOLONE 4 MG PO TABS: ORAL_TABLET | ORAL | Status: DC

## 2014-12-16 NOTE — ED Provider Notes (Signed)
[facility] Emergency Department Provider Note  ____________________________________________  Time seen: Approximately 10:41 AM  I have reviewed the triage vital signs and the nursing notes.   HISTORY  Chief Complaint Leg Pain    HPI Michelle Beck is a 81 y.o. female with a history of remote right hip arthroscopy for femur fracture, osteoporosis, and Parkinson's presenting with right hip, buttock, and thigh pain. The patient reports that over the last several weeks she's been having an intermittent "dull" pain that radiates from the right hip into the buttock and right lateral thigh. She denies any numbness or tingling, back pain, recent falls. She denies saddle anesthesia, urinary or fecal retention or incontinence. She has not been having any fevers. She was seen by her PMD who referred her to the pain clinic for further evaluation and possible corticosteroid shot in the lumbar spine; this appointment is in October. Over the past couple of days, her pain has been worse and she has been unable to walk due to pain. At baseline, she uses a walker for ambulation.   Past Medical History  Diagnosis Date  . Closed fracture of three ribs   . Closed fracture of unspecified part of vertebral column without mention of spinal cord injury   . Osteoporosis, unspecified   . Closed fracture of unspecified part of neck of femur   . Unspecified disorder of plasma protein metabolism   . Disturbance of salivary secretion   . Pain in thoracic spine   . Paralysis agitans   . Unspecified adverse effect of unspecified drug, medicinal and biological substance   . Scoliosis (and kyphoscoliosis), idiopathic   . Parkinson's disease   . Hypertension   . GERD (gastroesophageal reflux disease)     Patient Active Problem List   Diagnosis Date Noted  . Parkinson's disease 08/24/2012    Past Surgical History  Procedure Laterality Date  . Hernia repair  2002  . Hip surgery       Current Outpatient Rx  Name  Route  Sig  Dispense  Refill  . amLODipine (NORVASC) 5 MG tablet   Oral   Take 5 mg by mouth daily. 1/2 tablet daily         . calcium citrate (CALCITRATE - DOSED IN MG ELEMENTAL CALCIUM) 950 MG tablet   Oral   Take 1 tablet by mouth 3 (three) times daily.         . carbidopa-levodopa (SINEMET IR) 25-100 MG per tablet   Oral   Take 0.5 tablets by mouth 4 (four) times daily. At 9:30 AM, 1:30 PM, 4:30 PM and 9:30 PM   120 tablet   6   . cholecalciferol (VITAMIN D) 400 UNITS TABS   Oral   Take 400 Units by mouth 3 (three) times daily.         Marland Kitchen docusate sodium (COLACE) 100 MG capsule   Oral   Take 100 mg by mouth 2 (two) times daily as needed for constipation.         . metoprolol tartrate (LOPRESSOR) 25 MG tablet   Oral   Take 12.5 mg by mouth daily.          Marland Kitchen omeprazole (PRILOSEC) 20 MG capsule   Oral   Take 20 mg by mouth daily.          . polyethylene glycol (MIRALAX / GLYCOLAX) packet   Oral   Take 0.4 g/kg by mouth as needed.         Marland Kitchen  rOPINIRole (REQUIP) 3 MG tablet   Oral   Take 1 tablet (3 mg total) by mouth 3 (three) times daily.   270 tablet   0   . zoledronic acid (RECLAST) 5 MG/100ML SOLN injection   Intravenous   Inject into the vein. yearly           Allergies Fentanyl; Actonel; Hydrocodone; Iodine; Penicillins; and Shellfish allergy  Family History  Problem Relation Age of Onset  . Heart failure Mother   . Heart disease Mother   . Emphysema Father   . COPD Father   . Diabetes Sister     Social History Social History  Substance Use Topics  . Smoking status: Never Smoker   . Smokeless tobacco: Never Used  . Alcohol Use: No    Review of Systems Constitutional: No fever/chills. Eyes: No visual changes. ENT: No sore throat. Cardiovascular: Denies chest pain, palpitations. Respiratory: Denies shortness of breath.  No cough. Gastrointestinal: No abdominal pain.  No nausea, no vomiting.   No diarrhea.  No constipation. Genitourinary: Negative for dysuria. Musculoskeletal: Negative for back pain. Pain in the right hip, buttock and thigh. Skin: Negative for rash. Neurological: Negative for headaches, focal weakness or numbness. No saddle anesthesia. No urinary or fecal incontinence or retention.  10-point ROS otherwise negative.  ____________________________________________   PHYSICAL EXAM:  VITAL SIGNS: ED Triage Vitals  Enc Vitals Group     BP 12/16/14 0918 140/71 mmHg     Pulse Rate 12/16/14 0918 81     Resp 12/16/14 0918 18     Temp 12/16/14 0918 97.9 F (36.6 C)     Temp Source 12/16/14 0918 Oral     SpO2 12/16/14 0918 94 %     Weight 12/16/14 0918 153 lb (69.4 kg)     Height 12/16/14 0918  (1.473 m)     Head Cir --      Peak Flow --      Pain Score 12/16/14 0919 10     Pain Loc --      Pain Edu? --      Excl. in GC? --     Constitutional: Alert and oriented. Well appearing and in no acute distress. Answer question appropriately. Eyes: Conjunctivae are normal.  EOMI. Head: Atraumatic. Nose: No congestion/rhinnorhea. Mouth/Throat: Mucous membranes are moist.  Neck: No stridor.  Supple.  No midline C-spine temp tenderness, no step-offs or deformities. Cardiovascular: Normal rate,   Respiratory: Normal respiratory effort.   Gastrointestinal: Soft and nontender. No distention. No peritoneal signs. Musculoskeletal: No LE edema. No midline thoracic or lumbar spine tenderness, no step-offs or deformities. Full range of motion of the right hip, right knee, and right ankle without pain. Normal DP and PT pulse in the right lower extremity. Normal sensation to light touch throughout the right lower extremity. Skin is intact in all right lower extremity. I am unable to reproduce the patient's pain. Neurologic:  Normal speech and language. No gross focal neurologic deficits are appreciated. 5 out of 5 hip flexion and plantar and dorsi flexion bilaterally. Skin:   Skin is warm, dry and intact. No rash noted. Psychiatric: Mood and affect are normal. Speech and behavior are normal.  Normal judgement.  ____________________________________________   LABS (all labs ordered are listed, but only abnormal results are displayed)  Labs Reviewed - No data to display ____________________________________________  EKG  Not indicated ____________________________________________  RADIOLOGY  No results found.  ____________________________________________   PROCEDURES  Procedure(s) performed: None  Critical  Care performed: No ____________________________________________   INITIAL IMPRESSION / ASSESSMENT AND PLAN / ED COURSE  Pertinent labs & imaging results that were available during my care of the patient were reviewed by me and considered in my medical decision making (see chart for details).  81 y.o. female with a history of remote right hip replacement presenting with acute on chronic right hip pain radiating to the right buttock and right thigh. She has no focal neurologic deficit on exam. Her musculoskeletal exam is also reassuring. The most likely etiology of the patient's pain is sciatica. I do not think that she is having aortic dissection, she has no evidence for acute arterial thrombotic disease. There is no evidence that her pain is referred from an acute abdominal pathology. I will get a x-ray given her pain with walking, but if it is negative I anticipate discharge her home with NSAIDs and a Medrol Dosepak with close PMD follow-up.  ----------------------------------------- 11:10 AM on 12/16/2014 -----------------------------------------  The patient pelvis and right hip x-rays do not show any acute process. She has received ibuprofen and we will attempt  ambulation with walker. If she is able to tolerate this we will send her home.  ____________________________________________  FINAL CLINICAL IMPRESSION(S) / ED DIAGNOSES  Final  diagnoses:  Sciatica, left      NEW MEDICATIONS STARTED DURING THIS VISIT:  New Prescriptions   No medications on file     Rockne Menghini, MD 12/16/14 1110

## 2014-12-16 NOTE — ED Notes (Signed)
Pt arrived via EMS. Pt complains of right hip and leg pain. States she was diagnosed with a pinched nerve on Monday of this week. Pt states pain worsened yesterday.

## 2014-12-16 NOTE — Discharge Instructions (Signed)
Please take ibuprofen for your pain. You may also apply a heating pad to the lower back and right hip for 20 minutes every 2 hours for pain control.  Please take all precautions to prevent falls. Make sure you ask for assistance if you need it to get around.  Please take the entire course of steroids to decrease inflammation around her sciatic nerve. If she starts to have side effects from steroids, please contact your primary care physician. Next  Please return to the emergency department if he develops severe pain, changes in her bowel or bladder function, fever, numbness, weakness, inability to walk, or any other symptoms concerning to you.

## 2014-12-16 NOTE — ED Notes (Signed)
Patient transported to X-ray 

## 2015-03-20 ENCOUNTER — Other Ambulatory Visit: Payer: Self-pay

## 2015-03-20 DIAGNOSIS — G20 Parkinson's disease: Secondary | ICD-10-CM

## 2015-03-20 MED ORDER — ROPINIROLE HCL 3 MG PO TABS: 3.0000 mg | ORAL_TABLET | Freq: Three times a day (TID) | ORAL | Status: DC

## 2015-06-25 ENCOUNTER — Ambulatory Visit (INDEPENDENT_AMBULATORY_CARE_PROVIDER_SITE_OTHER): Payer: Medicare Other | Admitting: Neurology

## 2015-06-25 ENCOUNTER — Encounter: Payer: Self-pay | Admitting: Neurology

## 2015-06-25 VITALS — BP 128/60 | HR 72 | Resp 16

## 2015-06-25 DIAGNOSIS — R5381 Other malaise: Secondary | ICD-10-CM

## 2015-06-25 DIAGNOSIS — M79604 Pain in right leg: Secondary | ICD-10-CM

## 2015-06-25 DIAGNOSIS — G20A1 Parkinson's disease without dyskinesia, without mention of fluctuations: Secondary | ICD-10-CM

## 2015-06-25 DIAGNOSIS — Z9181 History of falling: Secondary | ICD-10-CM | POA: Diagnosis not present

## 2015-06-25 DIAGNOSIS — G20 Parkinson's disease: Secondary | ICD-10-CM | POA: Diagnosis not present

## 2015-06-25 DIAGNOSIS — R600 Localized edema: Secondary | ICD-10-CM

## 2015-06-25 MED ORDER — ROPINIROLE HCL 2 MG PO TABS: 2.0000 mg | ORAL_TABLET | Freq: Three times a day (TID) | ORAL | Status: DC

## 2015-06-25 MED ORDER — CARBIDOPA-LEVODOPA 25-100 MG PO TABS: 0.5000 | ORAL_TABLET | Freq: Every day | ORAL | Status: DC

## 2015-06-25 NOTE — Patient Instructions (Addendum)
I think your leg weakness is from a combination of things: advancing parkinson's, leg swelling, sciatica, partial hip replacement, falls, aging. We will do home health PT, I will make a referral.  We should reduce the ropinirole to 2 mg three times a day. We will increase your levodopa to 1/2 pill 5 times a day, starting at 9:30 AM, 12:30 PM, 3:30 PM, 6:30 PM and 9:30 PM Your amlodipine can cause swelling as well, please discuss with Dr. Hyacinth MeekerMiller next time you see him.  Please have your eyes checked again.

## 2015-06-25 NOTE — Progress Notes (Signed)
Subjective:    Patient ID: Michelle Beck is a 82 y.o. female.  HPI     Interim history:   Michelle Beck is an 82 year old right-handed woman with an underlying medical history of hyperlipidemia, hypertension, chronic back, s/p back surgery, hip pain, s/p R hip surgery in 2009, scoliosis, osteoporosis, compression fractures and rib fractures, s/p hernia surgery on the right in 2002, who presents for followup consultation of her left-sided predominant Parkinson's disease of approximately 8-9 years' duration, complicated by lower extremity edema, sialorrhea (which did not respond to botulinum toxin injection in the past), falls, RBD and overall frailty with advancing age. She is accompanied by her husband again today. I last saw her on 12/05/2014, at which time she reported having more difficulty with stiffness and leg weakness. She had been off of the amantadine. She tried to increase the Sinemet to 1 pill 3 times a day but felt worse, as she was struggling also with nausea every morning. She felt more jittery and went back to just half a pill 3 times a day. She will take it at 9:30, 3:30 and 9:30. She was on Requip 3 mg 3 times a day, first dose at 7:30 AM. Her leg swelling was a little better, mood and memory were stable. She reported right hip pain. She was not able to exercise very much. We increased the C/L to 1/2 pill qid.   Today, 06/25/15: She reports feeling more weaker in her legs, it is difficult for her to turn. Thankfully, she has not fallen. Her husband does not have to help her very much but she does have difficulty getting out of chairs, getting started with any movements, she feels fatigued quickly. In the past, she had physical therapy, she tried home health physical therapy after her hip surgery, but could not continue with the home exercises as her sciatica symptoms flared up. She has been reluctant to make changes to her medications for fear of becoming worse with her Parkinson's and  becoming more nervous. Of note, she presented to the emergency room on 12/16/2014 with right-sided hip pain. X-rays were done in the emergency room. I reviewed the records. She was given anti-inflammatory pain medication.She has since then received injection into her lower back for sciatica. She felt improved for a few days.  Previously:  I saw her on 07/31/2014, at which time she reported feeling more stiff, she had more constipation, more drooling. She had more right hip pain after using a pedaling device that her son bought for her. The first day she used it for 15 minutes and then the next day she used it again for 15 minutes and started having pain. Since then she has used it only once more. She was on amantadine 100 mg once daily, Sinemet half a pill 3 times a day and Requip 3 mg 3 times a day. She had lower extremity edema. Her memory was stable and she had not fallen recently. On examination, her swelling was a little worse. She was advised to increase her water intake and monitor her lower extremity swelling. I suggested we taper off amantadine altogether. She was advised to increase her Sinemet to one whole pill 3 times a day. I kept the Requip the same.  I saw her on 05/02/2014, at which time she reported intermittent right leg pain. She described an aching sensation in her right thigh but also groin pain and discomfort at the outer aspect of her right hip. She had ongoing low back  pain, radiating to the right. She had not fallen. She had seen orthopedics in the past. She had a right hip fracture and had a partial hip replacement. Her lower extremity swelling was better. I asked her to continue with her Parkinson's medication. I advised her to see her primary care physician if her right hip pain did not improve with ibuprofen.    I saw her on 01/26/2014, at which time she reported more slowness and her speech was soft. She was bothered by low back pain but had not seen a back specialist. She had  tried lower back injections in the past which she felt were not helpful. Physical therapy also did not help much. She was on Requip 3 mg 3 times a day. She did not try her leg compression stockings because she felt it was too hot in the summer. I suggested that we consider reduction in her Requip because of her lower extremity swelling and try to increase her Sinemet. She was reluctant to pursue this at the time. She called back reporting that she felt worse with her legs and that she could hardly walk. I suggested she come in for a sooner than scheduled appointment.   I saw her on 07/26/2013, at which time she reported feeling stable. She was using a cane around the house. She reported some back pain. She was sleeping well. She denied no major issues with her memory, exacerbation of RBD, no depression but had some anxiety, no delusions, some hallucinations and no dyskinesias. She reported no recent falls. I did not make any changes to her medications as I felt she was fairly stable. I did worry about her lower extremity edema which I felt was due to the Requip. I asked her to elevate her feet and use compression hose is up to her knees during the day. I talked her about potentially reducing Requip down the road and starting her on Sinemet but she was reluctant to try this for fear of side effects with Sinemet.  I saw her on 03/07/2013, at which time I felt her lower extremity edema was improved. She reported constipation from Sinemet and did not endorse much in the way of improvement with Sinemet and therefore we decided to taper her off of it. She was to continue with amantadine and Requip. 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 I considered using Linzess for her constipation down the Road. In the interim, she called back and January of this year and requested to restart Sinemet as she felt that she was worse without it.  I first met her on 12/07/2012 at which time I was  worried about her lower extremity edema and I encouraged her to continue using compression stockings to her lower extremities and elevate her legs if possible. I referred her to a vascular specialist. I talked to her about reducing Requip but she was reluctant. I introduced a cautious dose of Sinemet at the time started with half a pill twice daily and with build up to half a pill 3 times a day. I wondered if she had developed livedo reticularis on amantadine.   She did not go to see the vein specialist. She uses her cane more than the walker.    I first met her on 08/24/2012, which time I did not add any new test or make any medication changes. I did talk to her about her lower extremity swelling and explained that the Requip may be to blame  in part for that. She has a family history of Parkinson's disease and her her daughter has been diagnosed with young onset Parkinson's disease recently.    The patient previously followed with Dr. Morene Antu and was last seen by him on 04/27/2012, at which time he felt that she was stable and he initiated a trial of Cymbalta for her back pain, but she stopped it as it did not help.   She was treated by Dr. Germain Osgood in [place] with Mirapex without benefit. In December 2008 she started seeing Dr. Erling Cruz. She was started on Requip but she developed nausea. Amantadine was added. She denied hallucinations or memory loss and has had bizarre dreams and symptoms suggestive of RBD. She had drooling which did not improve with Botox injections. She exercises regularly. She has had multiple falls. In May 2012 she fell and fractured the right hip requiring surgery. She also fractured her right femur in June 2012. She has fracture 2 vertebral bodies in January 2013 requiring surgery. She had a TENS unit and epidural injections and Lidoderm patches without benefit.    Her Past Medical History Is Significant For: Past Medical History  Diagnosis Date  . Closed fracture of  three ribs   . Closed fracture of unspecified part of vertebral column without mention of spinal cord injury   . Osteoporosis, unspecified   . Closed fracture of unspecified part of neck of femur   . Unspecified disorder of plasma protein metabolism   . Disturbance of salivary secretion   . Pain in thoracic spine   . Paralysis agitans ([place])   . Unspecified adverse effect of unspecified drug, medicinal and biological substance   . Scoliosis (and kyphoscoliosis), idiopathic   . Parkinson's disease ([place])   . Hypertension   . GERD (gastroesophageal reflux disease)     Her Past Surgical History Is Significant For: Past Surgical History  Procedure Laterality Date  . Hernia repair  2002  . Hip surgery      Her Family History Is Significant For: Family History  Problem Relation Age of Onset  . Heart failure Mother   . Heart disease Mother   . Emphysema Father   . COPD Father   . Diabetes Sister     Her Social History Is Significant For: Social History   Social History  . Marital Status: Married    Spouse Name: N/A  . Number of Children: N/A  . Years of Education: N/A   Occupational History  .      retired   Social History Main Topics  . Smoking status: Never Smoker   . Smokeless tobacco: Never Used  . Alcohol Use: No  . Drug Use: No  . Sexual Activity: Not Asked   Other Topics Concern  . None   Social History Narrative    Her Allergies Are:  Allergies  Allergen Reactions  . Fentanyl Anxiety and Shortness Of Breath  . Actonel [Risedronate Sodium]     Reaction unknown  . Hydrocodone     Reaction unknown  . Iodine   . Penicillins   . Shellfish Allergy     Reaction unknown  :   Her Current Medications Are:  Outpatient Encounter Prescriptions as of 06/25/2015  Medication Sig  . amLODipine (NORVASC) 5 MG tablet Take 5 mg by mouth daily. 1/2 tablet daily  . calcium citrate (CALCITRATE - DOSED IN MG ELEMENTAL CALCIUM) 950 MG tablet Take 1 tablet by mouth 3  (three) times daily.  Marland Kitchen  carbidopa-levodopa (SINEMET IR) 25-100 MG per tablet Take 0.5 tablets by mouth 4 (four) times daily. At 9:30 AM, 1:30 PM, 4:30 PM and 9:30 PM  . cholecalciferol (VITAMIN D) 400 UNITS TABS Take 400 Units by mouth daily.   Marland Kitchen ibuprofen (ADVIL,MOTRIN) 200 MG tablet Take 1 tablet (200 mg total) by mouth every 8 (eight) hours as needed for moderate pain (with food).  . metoprolol tartrate (LOPRESSOR) 25 MG tablet Take 12.5 mg by mouth daily.   Marland Kitchen omeprazole (PRILOSEC) 20 MG capsule Take 20 mg by mouth daily.   Marland Kitchen rOPINIRole (REQUIP) 3 MG tablet Take 1 tablet (3 mg total) by mouth 3 (three) times daily.  . zoledronic acid (RECLAST) 5 MG/100ML SOLN injection Inject into the vein. yearly  . [DISCONTINUED] docusate sodium (COLACE) 100 MG capsule Take 100 mg by mouth 2 (two) times daily as needed for constipation.  . [DISCONTINUED] methylPREDNISolone (MEDROL) 4 MG tablet Day 1: 8 mg PO before breakfast, 4 mg after lunch and after dinner, and 8 mg at bedtime   Day 2: 4 mg PO before breakfast, after lunch, and after dinner and 8 mg at bedtime  Day 3: 4 mg PO before breakfast, after lunch, after dinner, and at bedtime  Day 4: 4 mg PO before breakfast, after lunch, and at bedtime   Day 5: 4 mg PO before breakfast and at bedtime  Day 6: 4 mg PO before breakfast  . [DISCONTINUED] polyethylene glycol (MIRALAX / GLYCOLAX) packet Take 0.4 g/kg by mouth as needed.   No facility-administered encounter medications on file as of 06/25/2015.  :  Review of Systems:  Out of a complete 14 point review of systems, all are reviewed and negative with the exception of these symptoms as listed below:   Review of Systems  Neurological:       Patient reports that she has increased weakness in both legs (especially in R), has to use walker and/or wheelchair. Recently received injection for sciatic pain, helped for a couple of days but then had increased weakness.      Objective:  Neurologic  Exam  Physical Exam Physical Examination:   Filed Vitals:   06/25/15 1017  BP: 128/60  Pulse: 72  Resp: 16   General Examination: The patient is a very pleasant 82 y.o. female in no acute distress. She is seated in a transport chair.   HEENT: Normocephalic, atraumatic, pupils are equal, round and reactive to light and accommodation. She is status post left cataract repair, she has a right cataract. Extraocular tracking shows mild saccadic breakdown without nystagmus noted. There is limitation to upper gaze. There is moderate decrease in eye blink rate. Hearing is impaired. Face is symmetric with moderate facial masking and normal facial sensation. There is no lip, neck or jaw tremor. Neck is severely rigid with intact passive ROM. There are no carotid bruits on auscultation. Oropharynx exam reveals no mouth dryness. No significant airway crowding is noted. Mallampati is class II. Tongue protrudes centrally and palate elevates symmetrically. There is minimal drooling.   Chest: is clear to auscultation without wheezing, rhonchi or crackles noted.  Heart: sounds are regular and normal without murmurs, rubs or gallops noted.   Abdomen: is soft, non-tender and non-distended with normal bowel sounds appreciated on auscultation.  Extremities: There is 2 + edema  around both anklesAnd distal lower extremities. Discoloration appears a little better.  Skin: is warm and dry with no trophic changes noted. Age-related changes are noted on the skin.  Musculoskeletal: exam reveals R hip pain.   Neurologically:  Mental status: The patient is awake and alert, paying good attention. She is able to completely provide the history.  her husband provides some details. She is oriented to: person, place, time/date, situation, day of week, month of year and year. Her memory, attention, language and knowledge are fairly well intact. There is no aphasia, agnosia, apraxia or anomia. There is a no significant degree  of bradyphrenia. Speech is mild to moderately hypophonic with no dysarthria noted. Mood is congruent and affect is normal.   Cranial nerves are as described above under HEENT exam. In addition, shoulder shrug is normal with equal shoulder height noted.  Motor exam: Normal bulk, and global strength of 4/5 for age is noted. There are no dyskinesias noted.  Tone is moderately rigid with presence of cogwheeling in the bilateral upper extremities, left worse than right. There is overall moderate bradykinesia. There is no drift or rebound.  There is a minimal intermittent resting tremor in the left upper extremity and no resting tremor elsewhere. The tremor is intermittent.  Romberg is not tested today.  Reflexes are 1+ in the upper extremities and 1+ in the lower extremities.   Fine motor skills exam: Finger taps are moderately impaired on the right and moderately to severely impaired on the left. Hand movements are mildly impaired on the right and moderately impaired on the left. RAP (rapid alternating patting) is moderately impaired on the right and moderately impaired on the left. Foot taps are moderately impaired on the right and moderately to severely impaired on the left. Foot agility (in the form of heel stomping) is moderately impaired on the right and moderately impaired on the left.    Cerebellar testing shows no dysmetria or intention tremor on finger to nose testing. Heel to shin is slightly difficult and she has decrease in range of motion in her right hip. There is no truncal or gait ataxia.   Sensory exam is intact to light touch in the UEs and LEs.  Gait, station and balance: She stands up from the seated position with moderate difficulty and does need to push up with Her hands. She needs some assistance, but takes 3 attempts. No veering to one side is noted. She is not noted to lean to the side. Posture is moderately stooped with kyphoscoliosis noted. Stance is wide-based. She walks with  decrease in stride length and pace and needs assistance from both sides as she did not bring a walker today. She has a couple of brief episodes of freezing and start hesitation was noted as well.  Balance is impaired.    Assessment and Plan:   In summary, Michelle Beck is a very pleasant 82 year old female with an underlying medical history of hyperlipidemia, hypertension, chronic back, s/p back surgery, hip pain, s/p R hip surgery in 2009, scoliosis, osteoporosis, compression fractures and rib fractures, s/p hernia surgery on the right in 2002, who presents for FU of her advanced Parkinson's disease, left-sided predominant, complicated by gait disorder, abnormal posture, RBD, LE edema, and constipation. Her physical exam is worse from last time and that she has more start hesitation, some freezing, difficulty turning, does not feel comfortable walking any more than a few steps. her swelling has become worse as well. She complains of weakness in her lower extremities. I explained to her that this is likely from a combination of things including swelling, deconditioning, low back pain, hip surgery, advancing age, advancing Parkinson's disease.  She was not able to tolerate Sinemet at one whole pill at a time. We increase it to half a pill 4 times a day last time. I think it is time to cut back on the Requip because of the swelling. She has always been quite reluctant to do so but I do think we need to try to reduce it gradually. To that end, I have asked her to reduce it to 2 mg strength one pill 3 times a day, I changed her prescription in that regard. In addition, we will try to push the levodopa slightly higher, half a pill 5 times a day, starting at 9:30 AM every 3 hours for a total of 5 doses. She was given written instructions for this change. In addition, she is advised to discuss her blood pressure medication with her primary care physician when she sees him next time. Amlodipine can also contribute to  swelling. Furthermore, I suggested home health physical therapy to strengthen her lower body, transfer, last help with turns and overcome start hesitation and freezing if possible. She has a walker available at the house. I will see her back in about 3 months from now, sooner if the need arises. I answered all her questions today and the patient and her husband were in agreement. I adjusted her Sinemet prescription.  I spent 25 minutes in total face-to-face time with the patient, more than 50% of which was spent in counseling and coordination of care, reviewing test results, reviewing medication and discussing or reviewing the diagnosis of PD, its prognosis and treatment options.

## 2015-08-08 ENCOUNTER — Telehealth: Payer: Self-pay | Admitting: *Deleted

## 2015-08-08 NOTE — Telephone Encounter (Signed)
Tried to call but got busy tone. Will try again tomorrow.

## 2015-08-08 NOTE — Telephone Encounter (Signed)
OK for patient to get 3 week extension on in home PT?

## 2015-08-08 NOTE — Telephone Encounter (Signed)
------------------------------------------------------------   Caller Elmer BalesMILY PARKER, PHYSICAL *   CID [id number]  Patient Michelle Beck          Pt's Dr Frances FurbishATHAR        Area Code 315 Phone# [phone] * DOB [phone]    RE *[facility]// WANTS 3 WEEK     EXTENSION: HOME HEALTH PT/ CAN GET A VERBAL ORDER    Disp:Y/N N If Y = C/B If No Response In 20minutes ============================================================

## 2015-08-08 NOTE — Telephone Encounter (Signed)
[facility] thx

## 2015-08-09 NOTE — Telephone Encounter (Signed)
I spoke to Michelle Beck and advised her that Dr. Frances FurbishAthar said that it was ok to extend PT for 3 weeks.

## 2015-09-06 ENCOUNTER — Telehealth: Payer: Self-pay | Admitting: Neurology

## 2015-09-06 DIAGNOSIS — R110 Nausea: Secondary | ICD-10-CM

## 2015-09-06 MED ORDER — ONDANSETRON HCL 4 MG PO TABS
4.0000 mg | ORAL_TABLET | Freq: Two times a day (BID) | ORAL | Status: DC | PRN
Start: 2015-09-06 — End: 2016-09-02

## 2015-09-06 NOTE — Telephone Encounter (Signed)
She may be nauseated from a combination of the Sinemet and Requip. Let's go down on the Sinemet to half a pill 4 times a day, 9:30, 1:30 PM, 5:30 PM and 9:30 PM. I can prescribe nausea medicine for as needed use, ondansetron, 4 mg up to 2 times a day. Please also inquire how she is taking the ropinirole, I suggested she go down to 2 mg strength, 1 pill 3 times a day, please verify.

## 2015-09-06 NOTE — Telephone Encounter (Signed)
I spoke to patient and she is aware of recommendations below and voiced understanding. She had her husband write down the new directions. She is aware of zofran being sent in . She will call back if any other questions.

## 2015-09-06 NOTE — Telephone Encounter (Signed)
Patient is calling. Since taking carbidopa-levodopa (SINEMET IR) 25-100 MG tablet she gets sicker and sicker with each day in the morning. In the afternoon she does feel some better. She is very upset. Please call and discuss.

## 2015-09-06 NOTE — Telephone Encounter (Signed)
I spoke to patient. She states that she feels "weak, sick and nauseous" from 8am to 10am everyday. Nausea lightens up after lunch and then does not feel completely better until bedtime.  She takes amlodipine, calcium, requip (1/2 tab), and metoprolol at 7:30am. Later takes C/L 1/2 tab starting at 9:30am. Patient has completed PT but no change in leg weakness. Patient feels that medication change has made her feel sick. Next appt with you is 7/3.

## 2015-09-24 ENCOUNTER — Encounter: Payer: Self-pay | Admitting: Neurology

## 2015-09-24 ENCOUNTER — Ambulatory Visit (INDEPENDENT_AMBULATORY_CARE_PROVIDER_SITE_OTHER): Payer: Medicare Other | Admitting: Neurology

## 2015-09-24 VITALS — BP 160/82 | HR 78 | Resp 18

## 2015-09-24 DIAGNOSIS — G20 Parkinson's disease: Secondary | ICD-10-CM | POA: Diagnosis not present

## 2015-09-24 DIAGNOSIS — R5381 Other malaise: Secondary | ICD-10-CM

## 2015-09-24 DIAGNOSIS — Z9181 History of falling: Secondary | ICD-10-CM | POA: Diagnosis not present

## 2015-09-24 DIAGNOSIS — R600 Localized edema: Secondary | ICD-10-CM

## 2015-09-24 MED ORDER — CARBIDOPA-LEVODOPA ER 25-100 MG PO TBCR: 1.0000 | EXTENDED_RELEASE_TABLET | Freq: Four times a day (QID) | ORAL | Status: DC

## 2015-09-24 MED ORDER — ROPINIROLE HCL 2 MG PO TABS: 2.0000 mg | ORAL_TABLET | Freq: Three times a day (TID) | ORAL | Status: DC

## 2015-09-24 NOTE — Progress Notes (Signed)
Subjective:    Patient ID: Michelle Beck is a 82 y.o. female.  HPI     Interim history:   Michelle Beck is an 82 year old right-handed woman with an underlying medical history of hyperlipidemia, hypertension, chronic back, s/p back surgery, hip pain, s/p R hip surgery in 2009, scoliosis, osteoporosis, compression fractures and rib fractures, s/p hernia surgery on the right in 2002, who presents for followup consultation of her left-sided predominant Parkinson's disease of approximately 9 years' duration, complicated by lower extremity edema, sialorrhea (which did not respond to botulinum toxin injection in the past), falls, RBD and overall frailty with advancing age. She is accompanied by her husband again today. I last saw her on , at which time she reported feeling overall more weaker in her legs, it was more difficult for her to turn. She had thankfully not had any falls. She was complaining of fatigue. She had physical therapy and tried home health physical therapy after her hip surgery but could not continue with the home exercises because her sciatica symptoms flared up. She had been quite reluctant and fearful of making changes to her Parkinson's medication for fear of getting worse and becoming more nervous. She presented to the emergency room on 12/16/2014 with right-sided hip pain. She had x-rays done. She was given anti-inflammatory pain medication. She had since then received injections into her lower back for sciatica and felt improved, albeit for a few days only. At her last visit I increased her Sinemet 5 times a day. I asked her to reduce ropinirole to 2 mg 3 times a day. We talked about her leg swelling and how amlodipine can also cause leg swelling. We continued with home health PT. I extended this in May 2017.  Today, 09/24/15: She reports feeling weaker, in particular in the last 2-3 weeks. She feels somewhat better in the afternoons, has nausea in the mornings and feels weak and stiff  first thing in the morning needs a lot of help with toileting and walking at the time. Can barely walk sometimes in the mornings and husband wheels her in the wheelchair. She could not tolerate the Zofran. It made her very constipated. She continues to take the amlodipine as per primary care physician. Her lower extremity swelling is indeed a little better. Of note, she called in the interim on 09/06/2015 complaining of nausea and not doing well. I suggested we decrease her Sinemet to half a pill 4 times a day and also called in a prescription for generic Zofran to be used as needed for nausea.   Previously:  I saw her on 12/05/2014, at which time she reported having more difficulty with stiffness and leg weakness. She had been off of the amantadine. She tried to increase the Sinemet to 1 pill 3 times a day but felt worse, as she was struggling also with nausea every morning. She felt more jittery and went back to just half a pill 3 times a day. She will take it at 9:30, 3:30 and 9:30. She was on Requip 3 mg 3 times a day, first dose at 7:30 AM. Her leg swelling was a little better, mood and memory were stable. She reported right hip pain. She was not able to exercise very much. We increased the C/L to 1/2 pill qid.   I saw her on 07/31/2014, at which time she reported feeling more stiff, she had more constipation, more drooling. She had more right hip pain after using a pedaling device that her  son bought for her. The first day she used it for 15 minutes and then the next day she used it again for 15 minutes and started having pain. Since then she has used it only once more. She was on amantadine 100 mg once daily, Sinemet half a pill 3 times a day and Requip 3 mg 3 times a day. She had lower extremity edema. Her memory was stable and she had not fallen recently. On examination, her swelling was a little worse. She was advised to increase her water intake and monitor her lower extremity swelling. I suggested  we taper off amantadine altogether. She was advised to increase her Sinemet to one whole pill 3 times a day. I kept the Requip the same.  I saw her on 05/02/2014, at which time she reported intermittent right leg pain. She described an aching sensation in her right thigh but also groin pain and discomfort at the outer aspect of her right hip. She had ongoing low back pain, radiating to the right. She had not fallen. She had seen orthopedics in the past. She had a right hip fracture and had a partial hip replacement. Her lower extremity swelling was better. I asked her to continue with her Parkinson's medication. I advised her to see her primary care physician if her right hip pain did not improve with ibuprofen.    I saw her on 01/26/2014, at which time she reported more slowness and her speech was soft. She was bothered by low back pain but had not seen a back specialist. She had tried lower back injections in the past which she felt were not helpful. Physical therapy also did not help much. She was on Requip 3 mg 3 times a day. She did not try her leg compression stockings because she felt it was too hot in the summer. I suggested that we consider reduction in her Requip because of her lower extremity swelling and try to increase her Sinemet. She was reluctant to pursue this at the time. She called back reporting that she felt worse with her legs and that she could hardly walk. I suggested she come in for a sooner than scheduled appointment.   I saw her on 07/26/2013, at which time she reported feeling stable. She was using a cane around the house. She reported some back pain. She was sleeping well. She denied no major issues with her memory, exacerbation of RBD, no depression but had some anxiety, no delusions, some hallucinations and no dyskinesias. She reported no recent falls. I did not make any changes to her medications as I felt she was fairly stable. I did worry about her lower extremity edema which  I felt was due to the Requip. I asked her to elevate her feet and use compression hose is up to her knees during the day. I talked her about potentially reducing Requip down the road and starting her on Sinemet but she was reluctant to try this for fear of side effects with Sinemet.  I saw her on 03/07/2013, at which time I felt her lower extremity edema was improved. She reported constipation from Sinemet and did not endorse much in the way of improvement with Sinemet and therefore we decided to taper her off of it. She was to continue with amantadine and Requip. 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 I considered using Linzess for her constipation down the Road. In the interim, she called back and January of this  year and requested to restart Sinemet as she felt that she was worse without it.  I first met her on 12/07/2012 at which time I was worried about her lower extremity edema and I encouraged her to continue using compression stockings to her lower extremities and elevate her legs if possible. I referred her to a vascular specialist. I talked to her about reducing Requip but she was reluctant. I introduced a cautious dose of Sinemet at the time started with half a pill twice daily and with build up to half a pill 3 times a day. I wondered if she had developed livedo reticularis on amantadine.   She did not go to see the vein specialist. She uses her cane more than the walker.    I first met her on 08/24/2012, which time I did not add any new test or make any medication changes. I did talk to her about her lower extremity swelling and explained that the Requip may be to blame in part for that. She has a family history of Parkinson's disease and her her daughter has been diagnosed with young onset Parkinson's disease recently.    The patient previously followed with Dr. Morene Antu and was last seen by him on 04/27/2012, at which time he felt that she was stable and he  initiated a trial of Cymbalta for her back pain, but she stopped it as it did not help.   She was treated by Dr. Germain Osgood in [place] with Mirapex without benefit. In December 2008 she started seeing Dr. Erling Cruz. She was started on Requip but she developed nausea. Amantadine was added. She denied hallucinations or memory loss and has had bizarre dreams and symptoms suggestive of RBD. She had drooling which did not improve with Botox injections. She exercises regularly. She has had multiple falls. In May 2012 she fell and fractured the right hip requiring surgery. She also fractured her right femur in June 2012. She has fracture 2 vertebral bodies in January 2013 requiring surgery. She had a TENS unit and epidural injections and Lidoderm patches without benefit.   Her Past Medical History Is Significant For: Past Medical History  Diagnosis Date  . Closed fracture of three ribs   . Closed fracture of unspecified part of vertebral column without mention of spinal cord injury   . Osteoporosis, unspecified   . Closed fracture of unspecified part of neck of femur   . Unspecified disorder of plasma protein metabolism   . Disturbance of salivary secretion   . Pain in thoracic spine   . Paralysis agitans ([place])   . Unspecified adverse effect of unspecified drug, medicinal and biological substance   . Scoliosis (and kyphoscoliosis), idiopathic   . Parkinson's disease ([place])   . Hypertension   . GERD (gastroesophageal reflux disease)     Her Past Surgical History Is Significant For: Past Surgical History  Procedure Laterality Date  . Hernia repair  2002  . Hip surgery      Her Family History Is Significant For: Family History  Problem Relation Age of Onset  . Heart failure Mother   . Heart disease Mother   . Emphysema Father   . COPD Father   . Diabetes Sister     Her Social History Is Significant For: Social History   Social History  . Marital Status: Married    Spouse Name: N/A   . Number of Children: N/A  . Years of Education: N/A   Occupational History  .  retired   Social History Main Topics  . Smoking status: Never Smoker   . Smokeless tobacco: Never Used  . Alcohol Use: No  . Drug Use: No  . Sexual Activity: Not Asked   Other Topics Concern  . None   Social History Narrative    Her Allergies Are:  Allergies  Allergen Reactions  . Fentanyl Anxiety and Shortness Of Breath  . Actonel [Risedronate Sodium]     Reaction unknown  . Hydrocodone     Reaction unknown  . Iodine   . Penicillins   . Shellfish Allergy     Reaction unknown  :   Her Current Medications Are:  Outpatient Encounter Prescriptions as of 09/24/2015  Medication Sig  . amLODipine (NORVASC) 2.5 MG tablet   . calcium citrate (CALCITRATE - DOSED IN MG ELEMENTAL CALCIUM) 950 MG tablet Take 1 tablet by mouth 3 (three) times daily.  . cholecalciferol (VITAMIN D) 400 UNITS TABS Take 400 Units by mouth daily.   Marland Kitchen ibuprofen (ADVIL,MOTRIN) 200 MG tablet Take 1 tablet (200 mg total) by mouth every 8 (eight) hours as needed for moderate pain (with food).  . metoprolol tartrate (LOPRESSOR) 25 MG tablet Take 12.5 mg by mouth daily.   Marland Kitchen omeprazole (PRILOSEC) 20 MG capsule Take 20 mg by mouth daily.   . ondansetron (ZOFRAN) 4 MG tablet Take 1 tablet (4 mg total) by mouth 2 (two) times daily as needed for nausea or vomiting.  Marland Kitchen rOPINIRole (REQUIP) 2 MG tablet Take 1 tablet (2 mg total) by mouth 3 (three) times daily.  . zoledronic acid (RECLAST) 5 MG/100ML SOLN injection Inject into the vein. yearly  . [DISCONTINUED] carbidopa-levodopa (SINEMET IR) 25-100 MG tablet Take 0.5 tablets by mouth 5 (five) times daily. At 9:30 AM, 12:30 PM, 3:30 PM, 6:30 PM, 9:30 PM  . [DISCONTINUED] rOPINIRole (REQUIP) 2 MG tablet Take 1 tablet (2 mg total) by mouth 3 (three) times daily.  . Carbidopa-Levodopa ER (SINEMET CR) 25-100 MG tablet controlled release Take 1 tablet by mouth 4 (four) times daily. 9:30,  1:30, 5:30, 9:30 PM  . [DISCONTINUED] amLODipine (NORVASC) 5 MG tablet Take 5 mg by mouth daily. 1/2 tablet daily   No facility-administered encounter medications on file as of 09/24/2015.  :  Review of Systems:  Out of a complete 14 point review of systems, all are reviewed and negative with the exception of these symptoms as listed below:  Review of Systems  Neurological:       Patient states that in the past couple of weeks she has had increased weakness. She is taking Sinemet 4 times a day, tried taking 5 times a day but it made her feel sick. She spoke with PCP about Amlodipine and leg swelling, PCP has decided to stay on it for now.      Objective:  Neurologic Exam  Physical Exam Physical Examination:   Filed Vitals:   09/24/15 1042  BP: 160/82  Pulse: 78  Resp: 18   General Examination: The patient is a very pleasant 82 y.o. female in no acute distress. She is seated in a WC.   HEENT: Normocephalic, atraumatic, pupils are equal, round and reactive to light and accommodation. She is status post left cataract repair, she has a right cataract. Extraocular tracking shows mild saccadic breakdown without nystagmus noted. There is limitation to upper gaze. There is moderate decrease in eye blink rate. Hearing is impaired. Face is symmetric with moderate facial masking and normal facial sensation. There is  no lip, neck or jaw tremor. Neck is severely rigid with intact passive ROM. There are no carotid bruits on auscultation. Oropharynx exam reveals no mouth dryness. No significant airway crowding is noted. Mallampati is class II. Tongue protrudes centrally and palate elevates symmetrically. There is mild drooling.   Chest: is clear to auscultation without wheezing, rhonchi or crackles noted.  Heart: sounds are regular and normal without murmurs, rubs or gallops noted.   Abdomen: is soft, non-tender and non-distended with normal bowel sounds appreciated on auscultation.  Extremities:  There is 1+ edema in the distal lower extremities. Discoloration appears a little better.  Skin: is warm and dry with no trophic changes noted. Age-related changes are noted on the skin.   Musculoskeletal: exam reveals R hip pain.   Neurologically:  Mental status: The patient is awake and alert, paying good attention. She is able to completely provide the history.  her husband provides some details. She is oriented to: person, place, time/date, situation, day of week, month of year and year. Her memory, attention, language and knowledge are fairly well intact. There is no aphasia, agnosia, apraxia or anomia. There is a no significant degree of bradyphrenia. Speech is mild to moderately hypophonic with no dysarthria noted. Mood is congruent and affect is normal.   Cranial nerves are as described above under HEENT exam. In addition, shoulder shrug is normal with equal shoulder height noted.  Motor exam: Normal bulk, and global strength of 4/5 for age is noted. There are no dyskinesias noted.  Tone is moderately rigid with presence of cogwheeling in the bilateral upper extremities, left worse than right. There is overall moderate bradykinesia. There is no drift or rebound.  There is a minimal intermittent resting tremor in the left upper extremity and no resting tremor elsewhere. The tremor is intermittent.  Romberg is not tested today.  Reflexes are 1+ in the upper extremities and 1+ in the lower extremities.   Fine motor skills exam: Finger taps are moderately impaired on the right and moderately to severely impaired on the left. Hand movements are mildly impaired on the right and moderately impaired on the left. RAP (rapid alternating patting) is moderately impaired on the right and moderately impaired on the left. Foot taps are moderately impaired on the right and moderately to severely impaired on the left. Foot agility (in the form of heel stomping) is moderately impaired on the right and  moderately impaired on the left.    Cerebellar testing shows no dysmetria or intention tremor on finger to nose testing. Heel to shin is slightly difficult and she has decrease in range of motion in her right hip. There is no truncal or gait ataxia.   Sensory exam is intact to light touch in the UEs and LEs.  Gait, station and balance: She did not bring her walker today. I did not have her stand or walk for me today and she complained of more weakness and I did not feel it was safe for her to stand or walk today.  Assessment and Plan:   In summary, Tehya Leath is a very pleasant 82 year old female with an underlying medical history of hyperlipidemia, hypertension, chronic back, s/p back surgery, hip pain, s/p R hip surgery in 2009, scoliosis, osteoporosis, compression fractures and rib fractures, s/p hernia surgery on the right in 2002, who presents for FU of her advanced Parkinson's disease, left-sided predominant, complicated by gait disorder, abnormal posture, RBD, LE edema, constipation and Overall deconditioning. She has had  more difficulty with mobility first thing in the morning and has had difficulty tolerating levodopa. We reduced her Requip because of lower extremity swelling which indeed has improved some. She is currently taking 2 mg 3 times a day. I had changed her Sinemet to half a pill 5 times a day but she could not tolerate it due to nausea, particularly in the mornings. Today, I suggested we change her to Sinemet CR, and keep her on a 4 a day schedule. She is currently taking her medication at 9:30 AM, 1:30 PM, 5:30 PM and 9:30 PM. She can continue with that schedule for now. Unfortunately, her overall weakness may be a combination of things including advance PT, muscular deconditioning, and overall aging. In the past, she was not able to tolerate immediate release Sinemet at one whole pill at a time. We increased it to half a pill 4 times a day at her previous visit. She has gone through  physical therapy home health. She does try to exercise when possible. We will keep her Requip 2 mg 3 times a day. I would like to see her back in 3 months, sooner as needed. I answered all their questions today and the patient and her husband were in agreement. I spent 25 minutes in total face-to-face time with the patient, more than 50% of which was spent in counseling and coordination of care, reviewing test results, reviewing medication and discussing or reviewing the diagnosis of PD, its prognosis and treatment options.

## 2015-09-24 NOTE — Patient Instructions (Addendum)
As discussed, we will change your sinemet to sinemet CR: 1 pill 4 times a day. We will stop the Sinemet immediate release (which was 1/2 pill 4 times a day).  We will keep the ropinirole the same for now. Swelling in your legs is better.

## 2015-11-16 ENCOUNTER — Telehealth: Payer: Self-pay | Admitting: Neurology

## 2015-11-16 NOTE — Telephone Encounter (Signed)
Pt's daughter called to notify the office pt has new pharmacy: [facility]; Phone: [phone], Fax: [phone].  Please send all new rx's to them.  May call [phone] Amil Amen- [place]

## 2015-11-19 NOTE — Telephone Encounter (Signed)
Pt called said she has changed her mind and would like to keep getting her medications [facility] [place]. Pt said she hopes she has not inconvience anyone.

## 2015-12-17 ENCOUNTER — Telehealth: Payer: Self-pay | Admitting: Neurology

## 2015-12-17 NOTE — Telephone Encounter (Signed)
Patient called, states Dr. Wallace/R[facility]ology[facility] is wanting to put her on FORTEO, "I'm scared to death of it", "there's a lot of side effects to it", requests call back ASAP, wants to know what Dr. Frances FurbishAthar thinks about this, needs to let them know something.

## 2015-12-17 NOTE — Telephone Encounter (Signed)
I spoke to patient and she is aware of recommendations below.  

## 2015-12-17 NOTE — Telephone Encounter (Signed)
As long as pharmacist reviews possible interactions with Forteo and sinemet, I am not opposed to her taking it. I am not sure what else I can tell her about it, I am not very familiar with the medication, unfortunately. Please call patient back to relay info.

## 2015-12-31 ENCOUNTER — Ambulatory Visit (INDEPENDENT_AMBULATORY_CARE_PROVIDER_SITE_OTHER): Payer: Medicare Other | Admitting: Neurology

## 2015-12-31 ENCOUNTER — Encounter: Payer: Self-pay | Admitting: Neurology

## 2015-12-31 DIAGNOSIS — G20 Parkinson's disease: Secondary | ICD-10-CM

## 2015-12-31 MED ORDER — CARBIDOPA-LEVODOPA ER 25-100 MG PO TBCR: 1.0000 | EXTENDED_RELEASE_TABLET | Freq: Four times a day (QID) | ORAL | 5 refills | Status: DC

## 2015-12-31 MED ORDER — ROPINIROLE HCL 2 MG PO TABS: 2.0000 mg | ORAL_TABLET | Freq: Three times a day (TID) | ORAL | 5 refills | Status: DC

## 2015-12-31 NOTE — Progress Notes (Signed)
Subjective:    Patient ID: Michelle Beck is a 82 y.o. female.  HPI     Interim history:   Michelle Beck is an 82 year old right-handed woman with an underlying medical history of hyperlipidemia, hypertension, chronic back, s/p back surgery, hip pain, s/p R hip surgery in 2009, scoliosis, osteoporosis, compression fractures and rib fractures, s/p hernia surgery on the right in 2002, who presents for followup consultation of her left-sided predominant Parkinson's disease of approximately 9 years' duration, complicated by lower extremity edema, sialorrhea (which did not respond to botulinum toxin injection in the past), falls, RBD and overall frailty with advancing age. She is accompanied by her husband again today. I last saw her on 09/24/15, at which time she reported feeling weaker. She had nausea in the mornings. She could not tolerate Zofran. She felt it made her very constipated. She had mild improvement in her lower extremity swelling. She had called on 09/06/2015 complaining of nausea and not doing well. I did decrease her Sinemet to half a pill 4 times a day and also called in a prescription for Zofran at the time. I suggested we switch her to Sinemet CR one pill 4 times a day. I asked her to stop the immediate release Sinemet which was half a pill 4 times a day.  Today, 12/31/2015: She reports still having nausea with her C/L, which we switched to CR last time. She does not think it made a difference, is worried about taking Forteo. Has concerns about SEs, in particular nausea. Has not seen GI. She has an Appointment with her primary care physician next month. She has low back pain. This is not a new problem. She is having a difficult time walking. She is mostly in her wheelchair or sitting.  Previously:  I saw her on 06/25/15, at which time she reported feeling overall more weaker in her legs, it was more difficult for her to turn. She had thankfully not had any falls. She was complaining of fatigue.  She had physical therapy and tried home health physical therapy after her hip surgery but could not continue with the home exercises because her sciatica symptoms flared up. She had been quite reluctant and fearful of making changes to her Parkinson's medication for fear of getting worse and becoming more nervous. She presented to the emergency room on 12/16/2014 with right-sided hip pain. She had x-rays done. She was given anti-inflammatory pain medication. She had since then received injections into her lower back for sciatica and felt improved, albeit for a few days only. At her last visit I increased her Sinemet 5 times a day. I asked her to reduce ropinirole to 2 mg 3 times a day. We talked about her leg swelling and how amlodipine can also cause leg swelling. We continued with home health PT. I extended this in May 2017.   I saw her on 12/05/2014, at which time she reported having more difficulty with stiffness and leg weakness. She had been off of the amantadine. She tried to increase the Sinemet to 1 pill 3 times a day but felt worse, as she was struggling also with nausea every morning. She felt more jittery and went back to just half a pill 3 times a day. She will take it at 9:30, 3:30 and 9:30. She was on Requip 3 mg 3 times a day, first dose at 7:30 AM. Her leg swelling was a little better, mood and memory were stable. She reported right hip pain. She was  not able to exercise very much. We increased the C/L to 1/2 pill qid.    I saw her on 07/31/2014, at which time she reported feeling more stiff, she had more constipation, more drooling. She had more right hip pain after using a pedaling device that her son bought for her. The first day she used it for 15 minutes and then the next day she used it again for 15 minutes and started having pain. Since then she has used it only once more. She was on amantadine 100 mg once daily, Sinemet half a pill 3 times a day and Requip 3 mg 3 times a day. She had  lower extremity edema. Her memory was stable and she had not fallen recently. On examination, her swelling was a little worse. She was advised to increase her water intake and monitor her lower extremity swelling. I suggested we taper off amantadine altogether. She was advised to increase her Sinemet to one whole pill 3 times a day. I kept the Requip the same.   I saw her on 05/02/2014, at which time she reported intermittent right leg pain. She described an aching sensation in her right thigh but also groin pain and discomfort at the outer aspect of her right hip. She had ongoing low back pain, radiating to the right. She had not fallen. She had seen orthopedics in the past. She had a right hip fracture and had a partial hip replacement. Her lower extremity swelling was better. I asked her to continue with her Parkinson's medication. I advised her to see her primary care physician if her right hip pain did not improve with ibuprofen.      I saw her on 01/26/2014, at which time she reported more slowness and her speech was soft. She was bothered by low back pain but had not seen a back specialist. She had tried lower back injections in the past which she felt were not helpful. Physical therapy also did not help much. She was on Requip 3 mg 3 times a day. She did not try her leg compression stockings because she felt it was too hot in the summer. I suggested that we consider reduction in her Requip because of her lower extremity swelling and try to increase her Sinemet. She was reluctant to pursue this at the time. She called back reporting that she felt worse with her legs and that she could hardly walk. I suggested she come in for a sooner than scheduled appointment.    I saw her on 07/26/2013, at which time she reported feeling stable. She was using a cane around the house. She reported some back pain. She was sleeping well. She denied no major issues with her memory, exacerbation of RBD, no depression but  had some anxiety, no delusions, some hallucinations and no dyskinesias. She reported no recent falls. I did not make any changes to her medications as I felt she was fairly stable. I did worry about her lower extremity edema which I felt was due to the Requip. I asked her to elevate her feet and use compression hose is up to her knees during the day. I talked her about potentially reducing Requip down the road and starting her on Sinemet but she was reluctant to try this for fear of side effects with Sinemet.   I saw her on 03/07/2013, at which time I felt her lower extremity edema was improved. She reported constipation from Sinemet and did not endorse much in the way  of improvement with Sinemet and therefore we decided to taper her off of it. She was to continue with amantadine and Requip. 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 I considered using Linzess for her constipation down the Road. In the interim, she called back and January of this year and requested to restart Sinemet as she felt that she was worse without it.   I first met her on 12/07/2012 at which time I was worried about her lower extremity edema and I encouraged her to continue using compression stockings to her lower extremities and elevate her legs if possible. I referred her to a vascular specialist. I talked to her about reducing Requip but she was reluctant. I introduced a cautious dose of Sinemet at the time started with half a pill twice daily and with build up to half a pill 3 times a day. I wondered if she had developed livedo reticularis on amantadine.   She did not go to see the vein specialist. She uses her cane more than the walker.     I first met her on 08/24/2012, which time I did not add any new test or make any medication changes. I did talk to her about her lower extremity swelling and explained that the Requip may be to blame in part for that. She has a family history of Parkinson's disease  and her her daughter has been diagnosed with young onset Parkinson's disease recently.     The patient previously followed with Dr. Morene Antu and was last seen by him on 04/27/2012, at which time he felt that she was stable and he initiated a trial of Cymbalta for her back pain, but she stopped it as it did not help.    She was treated by Dr. Germain Osgood in [place] with Mirapex without benefit. In December 2008 she started seeing Dr. Erling Cruz. She was started on Requip but she developed nausea. Amantadine was added. She denied hallucinations or memory loss and has had bizarre dreams and symptoms suggestive of RBD. She had drooling which did not improve with Botox injections. She exercises regularly. She has had multiple falls. In May 2012 she fell and fractured the right hip requiring surgery. She also fractured her right femur in June 2012. She has fracture 2 vertebral bodies in January 2013 requiring surgery. She had a TENS unit and epidural injections and Lidoderm patches without benefit.   Her Past Medical History Is Significant For: Past Medical History:  Diagnosis Date  . Closed fracture of three ribs   . Closed fracture of unspecified part of neck of femur   . Closed fracture of unspecified part of vertebral column without mention of spinal cord injury   . Disturbance of salivary secretion   . GERD (gastroesophageal reflux disease)   . Hypertension   . Osteoporosis, unspecified   . Pain in thoracic spine   . Paralysis agitans ([place])   . Parkinson's disease ([place])   . Scoliosis (and kyphoscoliosis), idiopathic   . Unspecified adverse effect of unspecified drug, medicinal and biological substance   . Unspecified disorder of plasma protein metabolism     Her Past Surgical History Is Significant For: Past Surgical History:  Procedure Laterality Date  . HERNIA REPAIR  2002  . HIP SURGERY      Her Family History Is Significant For: Family History  Problem Relation Age of Onset  .  Heart failure Mother   . Heart disease Mother   .  Emphysema Father   . COPD Father   . Diabetes Sister     Her Social History Is Significant For: Social History   Social History  . Marital status: Married    Spouse name: N/A  . Number of children: N/A  . Years of education: N/A   Occupational History  .      retired   Social History Main Topics  . Smoking status: Never Smoker  . Smokeless tobacco: Never Used  . Alcohol use No  . Drug use: No  . Sexual activity: Not Asked   Other Topics Concern  . None   Social History Narrative  . None    Her Allergies Are:  Allergies  Allergen Reactions  . Fentanyl Anxiety and Shortness Of Breath  . Actonel [Risedronate Sodium]     Reaction unknown  . Hydrocodone     Reaction unknown  . Iodine   . Penicillins   . Shellfish Allergy     Reaction unknown  :   Her Current Medications Are:  Outpatient Encounter Prescriptions as of 12/31/2015  Medication Sig  . amLODipine (NORVASC) 2.5 MG tablet   . calcium citrate (CALCITRATE - DOSED IN MG ELEMENTAL CALCIUM) 950 MG tablet Take 1 tablet by mouth 3 (three) times daily.  . Carbidopa-Levodopa ER (SINEMET CR) 25-100 MG tablet controlled release Take 1 tablet by mouth 4 (four) times daily. 9:30, 1:30, 5:30, 9:30 PM  . cholecalciferol (VITAMIN D) 400 UNITS TABS Take 400 Units by mouth daily.   Marland Kitchen ibuprofen (ADVIL,MOTRIN) 200 MG tablet Take 1 tablet (200 mg total) by mouth every 8 (eight) hours as needed for moderate pain (with food).  . metoprolol tartrate (LOPRESSOR) 25 MG tablet Take 12.5 mg by mouth daily.   Marland Kitchen omeprazole (PRILOSEC) 20 MG capsule Take 20 mg by mouth daily.   . ondansetron (ZOFRAN) 4 MG tablet Take 1 tablet (4 mg total) by mouth 2 (two) times daily as needed for nausea or vomiting.  . pramipexole (MIRAPEX) 1 MG tablet Take 1 mg by mouth 3 (three) times daily.  Marland Kitchen rOPINIRole (REQUIP) 2 MG tablet Take 1 tablet (2 mg total) by mouth 3 (three) times daily.  .  [DISCONTINUED] zoledronic acid (RECLAST) 5 MG/100ML SOLN injection Inject into the vein. yearly   No facility-administered encounter medications on file as of 12/31/2015.   :  Review of Systems:  Out of a complete 14 point review of systems, all are reviewed and negative with the exception of these symptoms as listed below:  Review of Systems  Neurological:       Patient states that one of her medications make her nauseous in the morning.   She reports that she is still nervous about taking Forteo and would like to discuss this.     Objective:  Neurologic Exam  Physical Exam Physical Examination:   Vitals:   12/31/15 1031  BP: (!) 158/72  Pulse: 76   General Examination: The patient is a very pleasant 82 y.o. female in no acute distress. She is seated in her WC.   HEENT: Normocephalic, atraumatic, pupils are equal, round and reactive to light and accommodation. She is status post left cataract repair, she has a right cataract. Extraocular tracking shows mild saccadic breakdown without nystagmus noted. There is limitation to upper gaze. There is moderate decrease in eye blink rate. Hearing is impaired. Face is symmetric with moderate facial masking and normal facial sensation. There is no lip, neck or jaw tremor. Neck is severely  rigid with intact passive ROM. There are no carotid bruits on auscultation. Oropharynx exam reveals no mouth dryness. No significant airway crowding is noted. Mallampati is class II. Tongue protrudes centrally and palate elevates symmetrically. There is mild drooling.   Chest: is clear to auscultation without wheezing, rhonchi or crackles noted.  Heart: sounds are regular and normal without murmurs, rubs or gallops noted.   Abdomen: is soft, non-tender and non-distended with normal bowel sounds appreciated on auscultation.  Extremities: There is 1+ edema in the distal lower extremities. Discoloration appears chronic.   Skin: is warm and dry with no  trophic changes noted. Age-related changes are noted on the skin.   Musculoskeletal: exam reveals R leg pain and LBP, non-radiating.   Neurologically:  Mental status: The patient is awake and alert, paying good attention. She is able to completely provide the history.  her husband provides some details. She is oriented to: person, place, time/date, situation, day of week, month of year and year. Her memory, attention, language and knowledge are fairly well intact. There is no aphasia, agnosia, apraxia or anomia. There is a no significant degree of bradyphrenia. Speech is mild to moderately hypophonic with no dysarthria noted. Mood is congruent and affect is normal.   Cranial nerves are as described above under HEENT exam. In addition, shoulder shrug is normal with equal shoulder height noted.  Motor exam: Normal bulk, and global strength of 4/5 for age is noted. There are no dyskinesias noted.  Tone is moderately rigid with presence of cogwheeling in the bilateral upper extremities, left perhaps worse than right. There is overall moderate bradykinesia. There is no drift or rebound.  There is a minimal intermittent resting tremor in the left upper extremity and no resting tremor elsewhere. The tremor is intermittent.  Romberg is not testable.   Reflexes are 1+ in the upper extremities and 1+ in the lower extremities.   Fine motor skills exam: Finger taps are moderately impaired on the right and moderately to severely impaired on the left. Hand movements are mildly impaired on the right and moderately impaired on the left. RAP (rapid alternating patting) is moderately impaired on the right and moderately impaired on the left. Foot taps are moderately impaired on the right and moderately to severely impaired on the left. Foot agility (in the form of heel stomping) is moderately impaired on the right and moderately impaired on the left.    Cerebellar testing shows no dysmetria or intention tremor on  finger to nose testing. Heel to shin is difficult, R more than L.   Sensory exam is intact to light touch in the UEs and LEs.  Gait, station and balance: She did not bring her walker today. I did not have her stand or walk for me today, as she cannot stand unsupported.   Assessment and Plan:   In summary, Margery Szostak is a very pleasant 82 year old female with an underlying medical history of hyperlipidemia, hypertension, chronic back, s/p back surgery, hip pain, s/p R hip surgery in 2009, scoliosis, osteoporosis, compression fractures and rib fractures, s/p hernia surgery on the right in 2002, who presents for FU of her advanced Parkinson's disease, left-sided predominant, complicated by gait disorder, abnormal posture, RBD, LE edema, constipation and Overall deconditioning. She has had more difficulty with mobility first thing in the morning and has had difficulty tolerating levodopa. We reduced her Requip because of lower extremity swelling which indeed has improved some. She is currently taking 2 mg 3 times  a day. I had changed her Sinemet to half a pill 5 times a day but she could not tolerate it due to nausea, particularly in the mornings. Then, we changed her to Sinemet CR, 1 pill 4 times a day schedule. She is worried about taking the Forteo, as it can cause nausea. She is encouraged to discuss this with Dr. Jefm Bryant, whom she sees for osteoporosis.  Unfortunately, her symptoms are difficult to tackle: overall weakness may be due a combination of things including advance PD, muscular deconditioning, and overall aging, change in posture, LBP and vertebral Fx in the past. Nausea did not improve with Zofran. In the past, she was not able to tolerate immediate release Sinemet at one whole pill at a time. She has gone through physical therapy home health. She does try to exercise when possible. We will keep her Requip 2 mg 3 times a day and her Sinemet CR one pill 4 times a day. I suggested she discuss  with her primary care physician next month a possibility to see GI for ongoing and chronic issues with nausea. I would like to see her back in 4 months, sooner as needed. I answered all their questions today and the patient and her husband were in agreement. I spent 25 minutes in total face-to-face time with the patient, more than 50% of which was spent in counseling and coordination of care, reviewing test results, reviewing medication and discussing or reviewing the diagnosis of PD, its prognosis and treatment options.

## 2015-12-31 NOTE — Patient Instructions (Addendum)
Please talk to Dr. Hyacinth MeekerMiller about seeing a GI specialist.  Please talk to Dr. Gavin PottersKernodle about your concerns with taking Forteo.  We will continue with you Requip at 2 mg 3 times a day and with your Sinemet CR 1 pill 4 times a day.

## 2016-05-05 ENCOUNTER — Encounter: Payer: Self-pay | Admitting: Neurology

## 2016-05-05 ENCOUNTER — Ambulatory Visit (INDEPENDENT_AMBULATORY_CARE_PROVIDER_SITE_OTHER): Payer: Medicare Other | Admitting: Neurology

## 2016-05-05 VITALS — BP 142/70 | HR 67

## 2016-05-05 DIAGNOSIS — G20 Parkinson's disease: Secondary | ICD-10-CM

## 2016-05-05 DIAGNOSIS — R110 Nausea: Secondary | ICD-10-CM

## 2016-05-05 DIAGNOSIS — Z9181 History of falling: Secondary | ICD-10-CM | POA: Diagnosis not present

## 2016-05-05 DIAGNOSIS — K5909 Other constipation: Secondary | ICD-10-CM

## 2016-05-05 MED ORDER — CARBIDOPA-LEVODOPA ER 25-100 MG PO TBCR: 1.0000 | EXTENDED_RELEASE_TABLET | Freq: Four times a day (QID) | ORAL | 3 refills | Status: DC

## 2016-05-05 MED ORDER — ROPINIROLE HCL 2 MG PO TABS: 2.0000 mg | ORAL_TABLET | Freq: Three times a day (TID) | ORAL | 3 refills | Status: DC

## 2016-05-05 NOTE — Progress Notes (Signed)
Subjective:    Patient ID: Michelle Beck is a 83 y.o. female.  HPI     Interim history:   Michelle Beck is an 83 year old right-handed woman with an underlying medical history of hyperlipidemia, hypertension, chronic back, s/p back surgery, hip pain, s/p R hip surgery in 2009, scoliosis, osteoporosis, compression fractures and rib fractures, s/p hernia surgery on the right in 2002, who presents for followup consultation of her left-sided predominant Parkinson's disease of 9+ years' duration, complicated by lower extremity edema, sialorrhea (which did not respond to botulinum toxin injection in the past), falls, RBD and overall frailty with advancing age. She is accompanied by her husband again today. I last saw her on 12/31/2015, at which time she reported having nausea with her Sinemet even with a CR. She did not think changing to a CR made a difference. She was worried about having to take Forteo for her osteoporosis. She had ongoing issues with low back pain and having more difficulty walking. She was mostly in her wheelchair or sitting down on a chair. I encouraged her to talk to her primary care physician about seeing a GI specialist for ongoing issues with nausea. I suggested we continue ropinirole 2 mg 3 times a day and Sinemet CR 1 pill 4 times a day.  Today, 05/05/2016: She reports doing about the same, could not tolerate the Forteo, now on Prolia, had one injection. Has been gaining weight slowly over the years, about 30 lb in the past few years. Still battling nausea. Does not always eat right, not much breakfast or lunch, biggest meal at night, loves potatoes, husband cooks. Has used pedaling devices in the past. Liked to use the treadmill in the past. Has constipation, uses warm prune juice.    Previously (copied from previous notes for reference):   I saw her on 09/24/15, at which time she reported feeling weaker. She had nausea in the mornings. She could not tolerate Zofran. She felt it made  her very constipated. She had mild improvement in her lower extremity swelling. She had called on 09/06/2015 complaining of nausea and not doing well. I did decrease her Sinemet to half a pill 4 times a day and also called in a prescription for Zofran at the time. I suggested we switch her to Sinemet CR one pill 4 times a day. I asked her to stop the immediate release Sinemet which was half a pill 4 times a day.   I saw her on 06/25/15, at which time she reported feeling overall more weaker in her legs, it was more difficult for her to turn. She had thankfully not had any falls. She was complaining of fatigue. She had physical therapy and tried home health physical therapy after her hip surgery but could not continue with the home exercises because her sciatica symptoms flared up. She had been quite reluctant and fearful of making changes to her Parkinson's medication for fear of getting worse and becoming more nervous. She presented to the emergency room on 12/16/2014 with right-sided hip pain. She had x-rays done. She was given anti-inflammatory pain medication. She had since then received injections into her lower back for sciatica and felt improved, albeit for a few days only. At her last visit I increased her Sinemet 5 times a day. I asked her to reduce ropinirole to 2 mg 3 times a day. We talked about her leg swelling and how amlodipine can also cause leg swelling. We continued with home health PT. I extended  this in May 2017.   I saw her on 12/05/2014, at which time she reported having more difficulty with stiffness and leg weakness. She had been off of the amantadine. She tried to increase the Sinemet to 1 pill 3 times a day but felt worse, as she was struggling also with nausea every morning. She felt more jittery and went back to just half a pill 3 times a day. She will take it at 9:30, 3:30 and 9:30. She was on Requip 3 mg 3 times a day, first dose at 7:30 AM. Her leg swelling was a little better, mood  and memory were stable. She reported right hip pain. She was not able to exercise very much. We increased the C/L to 1/2 pill qid.    I saw her on 07/31/2014, at which time she reported feeling more stiff, she had more constipation, more drooling. She had more right hip pain after using a pedaling device that her son bought for her. The first day she used it for 15 minutes and then the next day she used it again for 15 minutes and started having pain. Since then she has used it only once more. She was on amantadine 100 mg once daily, Sinemet half a pill 3 times a day and Requip 3 mg 3 times a day. She had lower extremity edema. Her memory was stable and she had not fallen recently. On examination, her swelling was a little worse. She was advised to increase her water intake and monitor her lower extremity swelling. I suggested we taper off amantadine altogether. She was advised to increase her Sinemet to one whole pill 3 times a day. I kept the Requip the same.   I saw her on 05/02/2014, at which time she reported intermittent right leg pain. She described an aching sensation in her right thigh but also groin pain and discomfort at the outer aspect of her right hip. She had ongoing low back pain, radiating to the right. She had not fallen. She had seen orthopedics in the past. She had a right hip fracture and had a partial hip replacement. Her lower extremity swelling was better. I asked her to continue with her Parkinson's medication. I advised her to see her primary care physician if her right hip pain did not improve with ibuprofen.      I saw her on 01/26/2014, at which time she reported more slowness and her speech was soft. She was bothered by low back pain but had not seen a back specialist. She had tried lower back injections in the past which she felt were not helpful. Physical therapy also did not help much. She was on Requip 3 mg 3 times a day. She did not try her leg compression stockings because  she felt it was too hot in the summer. I suggested that we consider reduction in her Requip because of her lower extremity swelling and try to increase her Sinemet. She was reluctant to pursue this at the time. She called back reporting that she felt worse with her legs and that she could hardly walk. I suggested she come in for a sooner than scheduled appointment.    I saw her on 07/26/2013, at which time she reported feeling stable. She was using a cane around the house. She reported some back pain. She was sleeping well. She denied no major issues with her memory, exacerbation of RBD, no depression but had some anxiety, no delusions, some hallucinations and no dyskinesias. She  reported no recent falls. I did not make any changes to her medications as I felt she was fairly stable. I did worry about her lower extremity edema which I felt was due to the Requip. I asked her to elevate her feet and use compression hose is up to her knees during the day. I talked her about potentially reducing Requip down the road and starting her on Sinemet but she was reluctant to try this for fear of side effects with Sinemet.   I saw her on 03/07/2013, at which time I felt her lower extremity edema was improved. She reported constipation from Sinemet and did not endorse much in the way of improvement with Sinemet and therefore we decided to taper her off of it. She was to continue with amantadine and Requip. 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 I considered using Linzess for her constipation down the Road. In the interim, she called back and January of this year and requested to restart Sinemet as she felt that she was worse without it.   I first met her on 12/07/2012 at which time I was worried about her lower extremity edema and I encouraged her to continue using compression stockings to her lower extremities and elevate her legs if possible. I referred her to a vascular specialist. I talked  to her about reducing Requip but she was reluctant. I introduced a cautious dose of Sinemet at the time started with half a pill twice daily and with build up to half a pill 3 times a day. I wondered if she had developed livedo reticularis on amantadine.   She did not go to see the vein specialist. She uses her cane more than the walker.     I first met her on 08/24/2012, which time I did not add any new test or make any medication changes. I did talk to her about her lower extremity swelling and explained that the Requip may be to blame in part for that. She has a family history of Parkinson's disease and her her daughter has been diagnosed with young onset Parkinson's disease recently.     The patient previously followed with Dr. Morene Antu and was last seen by him on 04/27/2012, at which time he felt that she was stable and he initiated a trial of Cymbalta for her back pain, but she stopped it as it did not help.    She was treated by Dr. Germain Osgood in [place] with Mirapex without benefit. In December 2008 she started seeing Dr. Erling Cruz. She was started on Requip but she developed nausea. Amantadine was added. She denied hallucinations or memory loss and has had bizarre dreams and symptoms suggestive of RBD. She had drooling which did not improve with Botox injections. She exercises regularly. She has had multiple falls. In May 2012 she fell and fractured the right hip requiring surgery. She also fractured her right femur in June 2012. She has fracture 2 vertebral bodies in January 2013 requiring surgery. She had a TENS unit and epidural injections and Lidoderm patches without benefit.    The patient's allergies, current medications, family history, past medical history, past social history, past surgical history and problem list were reviewed and updated as appropriate.    Her Past Medical History Is Significant For: Past Medical History:  Diagnosis Date  . Closed fracture of three ribs   .  Closed fracture of unspecified part of neck of femur   . Closed fracture  of unspecified part of vertebral column without mention of spinal cord injury   . Disturbance of salivary secretion   . GERD (gastroesophageal reflux disease)   . Hypertension   . Osteoporosis, unspecified   . Pain in thoracic spine   . Paralysis agitans ([place])   . Parkinson's disease ([place])   . Scoliosis (and kyphoscoliosis), idiopathic   . Unspecified adverse effect of unspecified drug, medicinal and biological substance   . Unspecified disorder of plasma protein metabolism     Her Past Surgical History Is Significant For: Past Surgical History:  Procedure Laterality Date  . HERNIA REPAIR  2002  . HIP SURGERY      Her Family History Is Significant For: Family History  Problem Relation Age of Onset  . Heart failure Mother   . Heart disease Mother   . Emphysema Father   . COPD Father   . Diabetes Sister     Her Social History Is Significant For: Social History   Social History  . Marital status: Married    Spouse name: N/A  . Number of children: N/A  . Years of education: N/A   Occupational History  .      retired   Social History Main Topics  . Smoking status: Never Smoker  . Smokeless tobacco: Never Used  . Alcohol use No  . Drug use: No  . Sexual activity: Not Asked   Other Topics Concern  . None   Social History Narrative  . None    Her Allergies Are:  Allergies  Allergen Reactions  . Fentanyl Anxiety and Shortness Of Breath  . Actonel [Risedronate Sodium]     Reaction unknown  . Hydrocodone     Reaction unknown  . Iodine   . Penicillins   . Shellfish Allergy     Reaction unknown  :   Her Current Medications Are:  Outpatient Encounter Prescriptions as of 05/05/2016  Medication Sig  . amLODipine (NORVASC) 2.5 MG tablet   . calcium citrate (CALCITRATE - DOSED IN MG ELEMENTAL CALCIUM) 950 MG tablet Take 1 tablet by mouth 3 (three) times daily.  . Carbidopa-Levodopa ER  (SINEMET CR) 25-100 MG tablet controlled release Take 1 tablet by mouth 4 (four) times daily. 9:30, 1:30, 5:30, 9:30 PM  . cholecalciferol (VITAMIN D) 400 UNITS TABS Take 400 Units by mouth daily.   Marland Kitchen denosumab (PROLIA) 60 MG/ML SOLN injection Inject 60 mg into the skin every 6 (six) months. Administer in upper arm, thigh, or abdomen  . ibuprofen (ADVIL,MOTRIN) 200 MG tablet Take 1 tablet (200 mg total) by mouth every 8 (eight) hours as needed for moderate pain (with food).  . metoprolol tartrate (LOPRESSOR) 25 MG tablet Take 12.5 mg by mouth daily.   Marland Kitchen omeprazole (PRILOSEC) 20 MG capsule Take 20 mg by mouth daily.   . ondansetron (ZOFRAN) 4 MG tablet Take 1 tablet (4 mg total) by mouth 2 (two) times daily as needed for nausea or vomiting.  Marland Kitchen rOPINIRole (REQUIP) 2 MG tablet Take 1 tablet (2 mg total) by mouth 3 (three) times daily.   No facility-administered encounter medications on file as of 05/05/2016.   :  Review of Systems:  Out of a complete 14 point review of systems, all are reviewed and negative with the exception of these symptoms as listed below: Review of Systems  Neurological:       Pt presents today to discuss her PD. Pt denies any new complaints at this time.    Objective:  Neurologic Exam  Physical Exam Physical Examination:   Vitals:   05/05/16 1414  BP: (!) 142/70  Pulse: 67    General Examination: The patient is a very pleasant 83 y.o. female in no acute distress. She appears well-developed and well-nourished and well groomed. Seated in her wheelchair. Mildly deconditioned appearing. Is able to give her own history fairly well.  HEENT: Normocephalic, atraumatic, pupils are equal, round and reactive to light and accommodation. Status post left cataract repair. On extraocular tracking she has moderate difficulty, some limitation to upper gaze. Hearing is mildly impaired. Neck is moderately rigid. Oropharynx exam reveals mild mouth dryness, no significant airway  crowding. Tongue and palate are symmetrical and central.   Chest: Clear to auscultation without wheezing, rhonchi or crackles noted.  Heart: S1+S2+0, regular and normal without murmurs, rubs or gallops noted.   Abdomen: Soft, non-tender and non-distended with normal bowel sounds appreciated on auscultation.  Extremities: There is trace pitting edema in the distal lower extremities bilaterally. Pedal pulses are intact.  Skin: Warm and dry without trophic changes noted, except stable discoloration of skin in distal legs.  Musculoskeletal: exam reveals no obvious joint deformities, tenderness or joint swelling or erythema, c/o L arm pain where she previously fractured it and right thigh pain where she previously injured her leg.  Neurologically:  Mental status: The patient is awake, alert and oriented in all 4 spheres. Her immediate and remote memory, attention, language skills and fund of knowledge are fairly well-preserved. There is no evidence of aphasia, agnosia, apraxia or anomia. Speech is clear with normal prosody and enunciation. Thought process is linear. Mood is normal and affect is normal.  Cranial nerves II - XII are as described above under HEENT exam. In addition: shoulder shrug is normal with equal shoulder height noted. Motor exam: Normal bulk,  global strength of 4 out of 5, no drift, mild intermittent resting tremor in the left upper extremity, minimal postural tremor in the left upper extremity only. She has moderate impairment of fine motor skills may be severely impaired in the lower extremities, on the right side, findings are worse on the left side.   Cerebellar testing: No dysmetria or intention tremor.  heel-to-shin is not possible for her.  Sensory exam: intact to light touch in the upper and lower extremities.  Gait, station and balance: She stands with difficulty and requires assistance. She has a moderately stooped posture. She's not able to stand narrow based. She did  not bring a walker and only took a few steps for me. Balance is impaired.  Assessment and Plan:   In summary, Michelle Beck is a very pleasant 83 y.o.-year old female with an Underlying complex medical history of hypertension, chronic back pain, status post back surgery, hip pain with status post right hip surgery in 2009, history of scoliosis, osteoporosis, history of falls and compression fractures as well as rib fractures, hyperlipidemia, and overweight state presents for follow-up consultation of her advanced Parkinson's disease, with left-sided predominant findings, complicated by falls, abnormal posture, history of REM behavior disorder, lower extremity edema, and constipation. We had in the past reduced her frequent, currently at 2 mg 3 times a day. She has had ongoing issues with nausea. We changed her from regular Sinemet to Sinemet long-acting. She is taking one pill 4 times a day. She had a reaction or side effects that his to [facility] including severe exacerbation of her nausea. She is now on Prolia, took her first treatment for that.  She has had ongoing issues with nausea and constipation. She is advised to seek an opinion with GI but she has not pursued it. For constipation I suggested she stay well hydrated, try to be physically active, even in the form of a stationary pedaling device that she could use. She is advised not to skip any meals and try to eat on her regular basis, not the biggest meal at night and try to avoid starchy food at night. I suggested we continue with her medications, I suggested a four-month follow-up with one of our nurse practitioners as I can see her back after that. I answered all their questions today and the patient and her husband were in agreement.  I spent 25 minutes in total face-to-face time with the patient, more than 50% of which was spent in counseling and coordination of care, reviewing test results, reviewing medication and discussing or reviewing the  diagnosis of PD, its prognosis and treatment options. Pertinent laboratory and imaging test results that were available during this visit with the patient were reviewed by me and considered in my medical decision making (see chart for details).

## 2016-05-05 NOTE — Patient Instructions (Signed)
We will continue with you Sinemet long-acting 1 pill 4 times a day. Constipation can be a big problem in advanced Parkinson's disease. It is important to be proactive: this includes ensuring adequate water intake and mobilization (walking around), utilizing stool softeners as needed, an over-the-counter laxative as needed up to daily if needed, adding a probiotic in pill form or in the form of yogurt can help as well. Sometimes, using a suppository or enema becomes necessary. You can continue taking warm prune juice daily and use her MiraLAX as needed. Try to not go longer than 2 days without a bowel movement. Try to stay well-hydrated and eat on a regular basis, to not try to skip any meals.

## 2016-07-09 ENCOUNTER — Telehealth: Payer: Self-pay | Admitting: Neurology

## 2016-07-09 DIAGNOSIS — G20 Parkinson's disease: Secondary | ICD-10-CM

## 2016-07-09 MED ORDER — CARBIDOPA-LEVODOPA ER 25-100 MG PO TBCR: 1.0000 | EXTENDED_RELEASE_TABLET | Freq: Every day | ORAL | 3 refills | Status: DC

## 2016-07-09 NOTE — Telephone Encounter (Signed)
I called pt. I advised her that Dr. Frances Furbish increased her sinemet to 5 times daily starting at 5:30am and sent the updated RX to her pharmacy. Pt says that she will try this for the next couple days and will call us back for further questions or concerns.

## 2016-07-09 NOTE — Addendum Note (Signed)
Addended by: Huston Foley on: 07/09/2016 01:05 PM   Modules accepted: Orders

## 2016-07-09 NOTE — Telephone Encounter (Signed)
Pt would like a call back to know if she can take the Carbidopa-Levodopa ER (SINEMET CR) 25-100 MG tablet controlled release around 5 a.m.

## 2016-07-09 NOTE — Telephone Encounter (Signed)
Okay to try the Sinement CR 1 pill 5 times a day. Rx adjusted to reflect 5 pills daily.  Please call patient back.

## 2016-07-09 NOTE — Telephone Encounter (Signed)
I called pt. She reports that she is currently taking sinemet 4 times daily but is nauseous when she wakes up until about 10-11am when her 9:30am dose of sinemet kicks in. The zofran is not helping her nausea either. Pt is wondering if she can take 5 tablets of sinemet daily, starting at 5:30am, and if she does not notice an improvement in her nausea, she may stop taking it. I advised her that I would send this concern to Dr. Frances Furbish. Pt verbalized understanding.

## 2016-09-02 ENCOUNTER — Ambulatory Visit (INDEPENDENT_AMBULATORY_CARE_PROVIDER_SITE_OTHER): Payer: Medicare Other | Admitting: Adult Health

## 2016-09-02 ENCOUNTER — Encounter (INDEPENDENT_AMBULATORY_CARE_PROVIDER_SITE_OTHER): Payer: Self-pay

## 2016-09-02 ENCOUNTER — Encounter: Payer: Self-pay | Admitting: Adult Health

## 2016-09-02 VITALS — BP 136/72 | HR 74 | Ht 61.0 in

## 2016-09-02 DIAGNOSIS — G20 Parkinson's disease: Secondary | ICD-10-CM

## 2016-09-02 DIAGNOSIS — R269 Unspecified abnormalities of gait and mobility: Secondary | ICD-10-CM | POA: Diagnosis not present

## 2016-09-02 NOTE — Progress Notes (Addendum)
PATIENT: Michelle Beck DOB: 1929-09-11  REASON FOR VISIT: follow up- Parkinson's disease HISTORY FROM: patient  HISTORY OF PRESENT ILLNESS: Today 09/02/2016: Michelle Beck is an 83 year old female with a history of Parkinson's disease. She returns today for follow-up. She continues on Sinemet extended release 4 times a day as well as Requip 3 times a day. She feels that this regimen is working well for her. She reports that she does have trouble ambulating. Reports it feels as if her knees will not hold her up at times. She uses a walker in the home. When out in public she uses a wheelchair. She does report that she's had 2 falls since her last visit. Fortunately she did not sustain any injuries. She denies any trouble sleeping. Denies any trouble swallowing. Denies a tremor. The patient reports that she still gets severe nausea in the mornings. She reports that she has tried multiple medications to treat the nausea but to no avail. She returns today for an evaluation.  HISTORY Michelle Beck is an 83 year old right-handed woman with an underlying medical history of hyperlipidemia, hypertension, chronic back, s/p back surgery, hip pain, s/p R hip surgery in 2009, scoliosis, osteoporosis, compression fractures and rib fractures, s/p hernia surgery on the right in 2002, who presents for followup consultation of her left-sided predominant Parkinson's disease of 9+ years' duration, complicated by lower extremity edema, sialorrhea (which did not respond to botulinum toxin injection in the past), falls, RBD and overall frailty with advancing age. She is accompanied by her husband again today. I last saw her on 12/31/2015, at which time she reported having nausea with her Sinemet even with a CR. She did not think changing to a CR made a difference. She was worried about having to take Forteo for her osteoporosis. She had ongoing issues with low back pain and having more difficulty walking. She was mostly in her  wheelchair or sitting down on a chair. I encouraged her to talk to her primary care physician about seeing a GI specialist for ongoing issues with nausea. I suggested we continue ropinirole 2 mg 3 times a day and Sinemet CR 1 pill 4 times a day.  Today, 05/05/2016: She reports doing about the same, could not tolerate the Forteo, now on Prolia, had one injection. Has been gaining weight slowly over the years, about 30 lb in the past few years. Still battling nausea. Does not always eat right, not much breakfast or lunch, biggest meal at night, loves potatoes, husband cooks. Has used pedaling devices in the past. Liked to use the treadmill in the past. Has constipation, uses warm prune juice.  REVIEW OF SYSTEMS: Out of a complete 14 system review of symptoms, the patient complains only of the following symptoms, and all other reviewed systems are negative.  Light sensitivity, leg swelling, drooling, constipation, swollen abdomen, incontinence of bladder, back pain, muscle cramps, walking difficulty, sleep talking  ALLERGIES: Allergies  Allergen Reactions  . Fentanyl Anxiety and Shortness Of Breath  . Actonel [Risedronate Sodium]     Reaction unknown  . Hydrocodone     Reaction unknown  . Iodine   . Penicillins   . Shellfish Allergy     Reaction unknown    HOME MEDICATIONS: Outpatient Medications Prior to Visit  Medication Sig Dispense Refill  . amLODipine (NORVASC) 2.5 MG tablet     . calcium citrate (CALCITRATE - DOSED IN MG ELEMENTAL CALCIUM) 950 MG tablet Take 1 tablet by mouth 3 (three) times daily.    Marland Kitchen  Carbidopa-Levodopa ER (SINEMET CR) 25-100 MG tablet controlled release Take 1 tablet by mouth 5 (five) times daily. 5:30 AM, 9:30 AM, 1:30 PM, 5:30 PM, 9:30 PM 450 tablet 3  . cholecalciferol (VITAMIN D) 400 UNITS TABS Take 400 Units by mouth daily.     Marland Kitchen denosumab (PROLIA) 60 MG/ML SOLN injection Inject 60 mg into the skin every 6 (six) months. Administer in upper arm, thigh, or  abdomen    . ibuprofen (ADVIL,MOTRIN) 200 MG tablet Take 1 tablet (200 mg total) by mouth every 8 (eight) hours as needed for moderate pain (with food). 20 tablet 0  . metoprolol tartrate (LOPRESSOR) 25 MG tablet Take 12.5 mg by mouth daily.     Marland Kitchen rOPINIRole (REQUIP) 2 MG tablet Take 1 tablet (2 mg total) by mouth 3 (three) times daily. 270 tablet 3  . omeprazole (PRILOSEC) 20 MG capsule Take 20 mg by mouth daily.     . ondansetron (ZOFRAN) 4 MG tablet Take 1 tablet (4 mg total) by mouth 2 (two) times daily as needed for nausea or vomiting. 20 tablet 0   No facility-administered medications prior to visit.     PAST MEDICAL HISTORY: Past Medical History:  Diagnosis Date  . Closed fracture of three ribs   . Closed fracture of unspecified part of neck of femur   . Closed fracture of unspecified part of vertebral column without mention of spinal cord injury   . Disturbance of salivary secretion   . Fall   . GERD (gastroesophageal reflux disease)   . Hypertension   . Osteoporosis, unspecified   . Pain in thoracic spine   . Paralysis agitans (HCC)   . Parkinson's disease (HCC)   . Scoliosis (and kyphoscoliosis), idiopathic   . Unspecified adverse effect of unspecified drug, medicinal and biological substance   . Unspecified disorder of plasma protein metabolism     PAST SURGICAL HISTORY: Past Surgical History:  Procedure Laterality Date  . HERNIA REPAIR  2002  . HIP SURGERY      FAMILY HISTORY: Family History  Problem Relation Age of Onset  . Heart failure Mother   . Heart disease Mother   . Emphysema Father   . COPD Father   . Diabetes Sister     SOCIAL HISTORY: Social History   Social History  . Marital status: Married    Spouse name: N/A  . Number of children: N/A  . Years of education: N/A   Occupational History  .      retired   Social History Main Topics  . Smoking status: Never Smoker  . Smokeless tobacco: Never Used  . Alcohol use No  . Drug use: No    . Sexual activity: Not on file   Other Topics Concern  . Not on file   Social History Narrative   Lives at home w/ her husband      PHYSICAL EXAM  Vitals:   09/02/16 1404  BP: 136/72  Pulse: 74  Height: 5\' 1"  (1.549 m)   There is no height or weight on file to calculate BMI.  Generalized: Well developed, in no acute distress   Neurological examination  Mentation: Alert oriented to time, place, history taking. Follows all commands speech and language fluent Cranial nerve II-XII: Pupils were equal round reactive to light. Extraocular movements were full, visual field were full on confrontational test. Facial sensation and strength were normal. Uvula tongue midline. Head turning and shoulder shrug  were normal and symmetric. Motor: The  motor testing reveals 5 over 5 strength of all 4 extremities. Moderate to severe impairment of finger taps and toe tap bilaterally.   Sensory: Sensory testing is intact to soft touch on all 4 extremities. No evidence of extinction is noted.  Coordination: Cerebellar testing reveals good finger-nose-finger bilaterally. Difficulty with heel to shin on the right due to mobility. Good on the left.  Gait and station: patient is in a wheelchair. Patient did not bring her walker and with her. For that reason we did not attempt to stand.   DIAGNOSTIC DATA (LABS, IMAGING, TESTING) - I reviewed patient records, labs, notes, testing and imaging myself where available.     ASSESSMENT AND PLAN 83 y.o. year old female  has a past medical history of Closed fracture of three ribs; Closed fracture of unspecified part of neck of femur; Closed fracture of unspecified part of vertebral column without mention of spinal cord injury; Disturbance of salivary secretion; Fall; GERD (gastroesophageal reflux disease); Hypertension; Osteoporosis, unspecified; Pain in thoracic spine; Paralysis agitans (HCC); Parkinson's disease (HCC); Scoliosis (and kyphoscoliosis), idiopathic;  Unspecified adverse effect of unspecified drug, medicinal and biological substance; and Unspecified disorder of plasma protein metabolism. here with:  1. Parkinson's disease 2. Abnormality of gait  Overall the patient has remained stable. She will continue on Sinemet ER 4 times a day as well as Requip 3 times a day. I advised patient that we can refer her to physical therapy for abnormality of gait however she reports that she has already tried this twice and did not see the benefit. Patient advised that if her symptoms worsen or she develops new symptoms she should let us know. She will follow-up in 6 months with Dr. Kenard GowerAthar     Kenasia Scheller, MSN, NP-C 09/02/2016, 2:10 PM [facility] [address], [address] [place], [place] [address] [phone]  I reviewed the above note and documentation by the Nurse Practitioner and agree with the history, physical exam, assessment and plan as outlined above. I was immediately available for face-to-face consultation. Huston FoleySaima Athar, MD, PhD [facility])

## 2016-09-02 NOTE — Patient Instructions (Signed)
Continue sinemet and requip If your symptoms worsen or you develop new symptoms please let us know.   Carbidopa; Levodopa sustained-release tablets What is this medicine? CARBIDOPA; LEVODOPA (kar bi DOE pa; lee voe DOE pa) is used to treat the symptoms of Parkinson's disease. This medicine may be used for other purposes; ask your health care provider or pharmacist if you have questions. COMMON BRAND NAME(S): SINEMET, SINEMET CR What should I tell my health care provider before I take this medicine? They need to know if you have any of these conditions: -depression or other mental illness -diabetes -glaucoma -heart disease, including history of a heart attack -irregular heart beat -kidney disease -liver disease -lung or breathing disease, like asthma -melanoma or suspicious skin lesions -stomach or intestine ulcers -an unusual or allergic reaction to levodopa, carbidopa, other medicines, foods, dyes, or preservatives -pregnant or trying to get pregnant -breast-feeding How should I use this medicine? Take this medicine by mouth with a glass of water. Follow the directions on the prescription label. Swallow whole. Do not crush or chew. You may cut the tablets in half. Take your doses at regular intervals. Do not take your medicine more often than directed. Do not stop taking except on the advice of your doctor or health care professional. Talk to your pediatrician regarding the use of this medicine in children. Special care may be needed. Overdosage: If you think you have taken too much of this medicine contact a poison control center or emergency room at once. NOTE: This medicine is only for you. Do not share this medicine with others. What if I miss a dose? If you miss a dose, take it as soon as you can. If it is almost time for your next dose, take only that dose. Do not take double or extra doses. What may interact with this medicine? Do not take this medicine with any of the  following medications: -MAOIs like Marplan, Nardil, and Parnate -reserpine -tetrabenazine This medicine may also interact with the following medications: -alcohol -droperidol -entacapone -iron supplements or multivitamins with iron -isoniazid, INH -linezolid -medicines for depression, anxiety, or psychotic disturbances -medicines for high blood pressure -medicines for sleep -metoclopramide -papaverine -procarbazine -tedizolid -rasagiline -selegiline -tolcapone This list may not describe all possible interactions. Give your health care provider a list of all the medicines, herbs, non-prescription drugs, or dietary supplements you use. Also tell them if you smoke, drink alcohol, or use illegal drugs. Some items may interact with your medicine. What should I watch for while using this medicine? Visit your doctor or health care professional for regular checks on your progress. It may be several weeks or months before you feel the full benefits of this medicine. Continue to take your medicine on a regular schedule. Do not take any additional medicines for Parkinson's disease without first consulting with your health care provider. You may get drowsy or dizzy. Do not drive, use machinery, or do anything that needs mental alertness until you know how this drug affects you. Do not stand or sit up quickly, especially if you are an older patient. This reduces the risk of dizzy or fainting spells. Alcohol can make you more drowsy and dizzy. Avoid alcoholic drinks. If you find that you have sudden feelings of wanting to sleep during normal activities, like cooking, watching television, or while driving or riding in a car, you should contact your health care professional. Bonita Quin may experience a "wearing off" effect prior to the time for your next dose of  this medicine. You may also experience an "on-off" effect where the medicine apparently stops working for anything from a minute to several hours, then  suddenly starts working again. Tell your doctor or health care professional if any of these symptoms happen to you. Your dose may need adjustment. A high protein diet can slow or prevent absorption of this medicine. Avoid high protein foods near the time of taking this medicine to help to prevent these problems. Take this medicine at least 30 minutes before eating or one hour after meals. You may want to eat higher protein foods later in the day or in small amounts. Discuss your diet with your doctor or health care professional or nutritionist. If you have diabetes, you may get a false-positive result for sugar in your urine. Check with your doctor or health care professional. This medicine may discolor the urine or sweat, making it look darker or red in color. This is of no cause for concern. However, this may stain clothing or fabrics. There have been reports of increased sexual urges or other strong urges such as gambling while taking some medicines for Parkinson's disease. If you experience any of these urges while taking this medicine, you should report it to your health care provider as soon as possible. You should check your skin often for changes to moles and new growths while taking this medicine. Call your doctor if you notice any of these changes. What side effects may I notice from receiving this medicine? Side effects that you should report to your doctor or health care professional as soon as possible: -allergic reactions like skin rash, itching or hives, swelling of the face, lips, or tongue -anxiety, confusion, or nervousness -falling asleep during normal activities like driving -fast, irregular heartbeat -hallucination, loss of contact with reality -mood changes like aggressive behavior, depression -stomach pain -trouble passing urine -uncontrolled movements of the mouth, head, hands, feet, shoulders, eyelids or other unusual muscle movements Side effects that usually do not require  medical attention (report to your doctor or health care professional if they continue or are bothersome): -headache -loss of appetite -muscle twitches -nausea/vomiting -nightmares, trouble sleeping -unusually weak ot tired This list may not describe all possible side effects. Call your doctor for medical advice about side effects. You may report side effects to FDA at 1-800-FDA-1088. Where should I keep my medicine? Keep out of the reach of children. Store below 30 degrees C (86 degrees F). Keep container tightly closed. Throw away any unused medicine after the expiration date. NOTE: This sheet is a summary. It may not cover all possible information. If you have questions about this medicine, talk to your doctor, pharmacist, or health care provider.  2018 [employer] Standard (2013-05-10 15:40:38)

## 2017-03-02 ENCOUNTER — Ambulatory Visit: Payer: Medicare Other | Admitting: Neurology

## 2017-03-04 ENCOUNTER — Ambulatory Visit (INDEPENDENT_AMBULATORY_CARE_PROVIDER_SITE_OTHER): Payer: Medicare Other | Admitting: Neurology

## 2017-03-04 ENCOUNTER — Encounter: Payer: Self-pay | Admitting: Neurology

## 2017-03-04 VITALS — BP 123/73 | HR 73

## 2017-03-04 DIAGNOSIS — K5909 Other constipation: Secondary | ICD-10-CM

## 2017-03-04 DIAGNOSIS — G20 Parkinson's disease: Secondary | ICD-10-CM

## 2017-03-04 DIAGNOSIS — R110 Nausea: Secondary | ICD-10-CM

## 2017-03-04 NOTE — Patient Instructions (Addendum)
For your issues with chronic nausea, we can try to get you off the Requip, by reducing to 1/2 pill 3 times a day for a few weeks and then 1/2 pill 2 times a day for a few days, then 1/2 pill daily for a few days, then stop.   We will continue with Sinemet CR 4 times a day.

## 2017-03-04 NOTE — Progress Notes (Signed)
Subjective:    Beck ID: Michelle Beck is a 83 y.o. female.  HPI    Interim history:   Michelle Beck is an 83 year old right-handed woman with an underlying medical history of hyperlipidemia, hypertension, chronic back, s/p back surgery, hip pain, s/p R hip surgery in 2009, scoliosis, osteoporosis, compression fractures and rib fractures, s/p hernia surgery on Michelle right in 2002, who presents for followup consultation of Michelle Beck left-sided predominant Parkinson's disease of about 10 years' duration, complicated by lower extremity edema, sialorrhea (which did not respond to botulinum toxin injection in Michelle past), falls, RBD and overall frailty with advancing age. Michelle Beck is accompanied by Michelle Beck husband again today. I last saw Michelle Beck on 05/05/2016, which time Michelle Beck reported feeling fairly stable. Michelle Beck could not tolerate Michelle Beck osteoporosis medicine. Michelle Beck was still complaining about nausea. Michelle Beck had issues with chronic constipation but was managing. I suggested we continue with Sinemet 1 pill 4 times a day and Requip 3 times a day.  Michelle Beck saw Jannette Spanner in Michelle interim on 09/02/2016, at which time Michelle Beck was deemed fairly stable and Michelle Beck medications were continued.   Today, 03/04/2017: Michelle Beck reports ongoing issues with nausea. Michelle Beck did not see a difference in taking Sinemet CR 5 times a day, therefore is just taking it 4 times a day. Michelle Beck still is taking Requip 2 mg 3 times a day. Michelle Beck has been on Requip for years, Michelle Beck has been on Michelle current dose for Michelle past year and a half or so. We reduced it in Michelle past because of lower extremity swelling. Michelle Beck swelling improved after we reduced Michelle dose. Michelle Beck has had constipation but lately it has been under control. Michelle Beck reports right leg weakness at times. Michelle Beck has also right lower back pain. Michelle Beck has a history of back pain and history of right hip surgery.    Michelle Beck's allergies, current medications, family history, past medical history, past social history, past surgical history  and problem list were reviewed and updated as appropriate.    Previously (copied from previous notes for reference):   I saw Michelle Beck on 12/31/2015, at which time Michelle Beck reported having nausea with Michelle Beck Sinemet even with a CR. Michelle Beck did not think changing to a CR made a difference. Michelle Beck was worried about having to take Forteo for Michelle Beck osteoporosis. Michelle Beck had ongoing issues with low back pain and having more difficulty walking. Michelle Beck was mostly in Michelle Beck wheelchair or sitting down on a chair. I encouraged Michelle Beck to talk to Michelle Beck primary care physician about seeing a GI specialist for ongoing issues with nausea. I suggested we continue ropinirole 2 mg 3 times a day and Sinemet CR 1 pill 4 times a day.   I saw Michelle Beck on 09/24/15, at which time Michelle Beck reported feeling weaker. Michelle Beck had nausea in Michelle mornings. Michelle Beck could not tolerate Zofran. Michelle Beck felt it made Michelle Beck very constipated. Michelle Beck had mild improvement in Michelle Beck lower extremity swelling. Michelle Beck had called on 09/06/2015 complaining of nausea and not doing well. I did decrease Michelle Beck Sinemet to half a pill 4 times a day and also called in a prescription for Zofran at Michelle time. I suggested we switch Michelle Beck to Sinemet CR one pill 4 times a day. I asked Michelle Beck to stop Michelle immediate release Sinemet which was half a pill 4 times a day.   I saw Michelle Beck on 06/25/15, at which time Michelle Beck reported feeling overall more weaker in Michelle Beck legs, it was more difficult for Michelle Beck to turn. Michelle Beck had  thankfully not had any falls. Michelle Beck was complaining of fatigue. Michelle Beck had physical therapy and tried home health physical therapy after Michelle Beck hip surgery but could not continue with Michelle home exercises because Michelle Beck sciatica symptoms flared up. Michelle Beck had been quite reluctant and fearful of making changes to Michelle Beck Parkinson's medication for fear of getting worse and becoming more nervous. Michelle Beck presented to Michelle emergency room on 12/16/2014 with right-sided hip pain. Michelle Beck had x-rays done. Michelle Beck was given anti-inflammatory pain medication. Michelle Beck had since then received  injections into Michelle Beck lower back for sciatica and felt improved, albeit for a few days only. At Michelle Beck last visit I increased Michelle Beck Sinemet 5 times a day. I asked Michelle Beck to reduce ropinirole to 2 mg 3 times a day. We talked about Michelle Beck leg swelling and how amlodipine can also cause leg swelling. We continued with home health PT. I extended this in May 2017.   I saw Michelle Beck on 12/05/2014, at which time Michelle Beck reported having more difficulty with stiffness and leg weakness. Michelle Beck had been off of Michelle amantadine. Michelle Beck tried to increase Michelle Sinemet to 1 pill 3 times a day but felt worse, as Michelle Beck was struggling also with nausea every morning. Michelle Beck felt more jittery and went back to just half a pill 3 times a day. Michelle Beck will take it at 9:30, 3:30 and 9:30. Michelle Beck was on Requip 3 mg 3 times a day, first dose at 7:30 AM. Michelle Beck leg swelling was a little better, mood and memory were stable. Michelle Beck reported right hip pain. Michelle Beck was not able to exercise very much. We increased Michelle C/L to 1/2 pill qid.    I saw Michelle Beck on 07/31/2014, at which time Michelle Beck reported feeling more stiff, Michelle Beck had more constipation, more drooling. Michelle Beck had more right hip pain after using a pedaling device that Michelle Beck son bought for Michelle Beck. Michelle first day Michelle Beck used it for 15 minutes and then Michelle next day Michelle Beck used it again for 15 minutes and started having pain. Since then Michelle Beck has used it only once more. Michelle Beck was on amantadine 100 mg once daily, Sinemet half a pill 3 times a day and Requip 3 mg 3 times a day. Michelle Beck had lower extremity edema. Michelle Beck memory was stable and Michelle Beck had not fallen recently. On examination, Michelle Beck swelling was a little worse. Michelle Beck was advised to increase Michelle Beck water intake and monitor Michelle Beck lower extremity swelling. I suggested we taper off amantadine altogether. Michelle Beck was advised to increase Michelle Beck Sinemet to one whole pill 3 times a day. I kept Michelle Requip Michelle same.   I saw Michelle Beck on 05/02/2014, at which time Michelle Beck reported intermittent right leg pain. Michelle Beck described an aching sensation in Michelle Beck right  thigh but also groin pain and discomfort at Michelle outer aspect of Michelle Beck right hip. Michelle Beck had ongoing low back pain, radiating to Michelle right. Michelle Beck had not fallen. Michelle Beck had seen orthopedics in Michelle past. Michelle Beck had a right hip fracture and had a partial hip replacement. Michelle Beck lower extremity swelling was better. I asked Michelle Beck to continue with Michelle Beck Parkinson's medication. I advised Michelle Beck to see Michelle Beck primary care physician if Michelle Beck right hip pain did not improve with ibuprofen.    I saw Michelle Beck on 01/26/2014, at which time Michelle Beck reported more slowness and Michelle Beck speech was soft. Michelle Beck was bothered by low back pain but had not seen a back specialist. Michelle Beck had tried lower back injections in Michelle past which Michelle Beck felt were not helpful. Physical  therapy also did not help much. Michelle Beck was on Requip 3 mg 3 times a day. Michelle Beck did not try Michelle Beck leg compression stockings because Michelle Beck felt it was too hot in Michelle summer. I suggested that we consider reduction in Michelle Beck Requip because of Michelle Beck lower extremity swelling and try to increase Michelle Beck Sinemet. Michelle Beck was reluctant to pursue this at Michelle time. Michelle Beck called back reporting that Michelle Beck felt worse with Michelle Beck legs and that Michelle Beck could hardly walk. I suggested Michelle Beck come in for a sooner than scheduled appointment.    I saw Michelle Beck on 07/26/2013, at which time Michelle Beck reported feeling stable. Michelle Beck was using a cane around Michelle house. Michelle Beck reported some back pain. Michelle Beck was sleeping well. Michelle Beck denied no major issues with Michelle Beck memory, exacerbation of RBD, no depression but had some anxiety, no delusions, some hallucinations and no dyskinesias. Michelle Beck reported no recent falls. I did not make any changes to Michelle Beck medications as I felt Michelle Beck was fairly stable. I did worry about Michelle Beck lower extremity edema which I felt was due to Michelle Requip. I asked Michelle Beck to elevate Michelle Beck feet and use compression hose is up to Michelle Beck knees during Michelle day. I talked Michelle Beck about potentially reducing Requip down Michelle road and starting Michelle Beck on Sinemet but Michelle Beck was reluctant to try this for fear of side  effects with Sinemet.   I saw Michelle Beck on 03/07/2013, at which time I felt Michelle Beck lower extremity edema was improved. Michelle Beck reported constipation from Sinemet and did not endorse much in Michelle way of improvement with Sinemet and therefore we decided to taper Michelle Beck off of it. Michelle Beck was to continue with amantadine and Requip. 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 I considered using Linzess for Michelle Beck constipation down Michelle Road. In Michelle interim, Michelle Beck called back and January of this year and requested to restart Sinemet as Michelle Beck felt that Michelle Beck was worse without it.   I first met Michelle Beck on 12/07/2012 at which time I was worried about Michelle Beck lower extremity edema and I encouraged Michelle Beck to continue using compression stockings to Michelle Beck lower extremities and elevate Michelle Beck legs if possible. I referred Michelle Beck to a vascular specialist. I talked to Michelle Beck about reducing Requip but Michelle Beck was reluctant. I introduced a cautious dose of Sinemet at Michelle time started with half a pill twice daily and with build up to half a pill 3 times a day. I wondered if Michelle Beck had developed livedo reticularis on amantadine.   Michelle Beck did not go to see Michelle vein specialist. Michelle Beck uses Michelle Beck cane more than Michelle walker.     I first met Michelle Beck on 08/24/2012, which time I did not add any new test or make any medication changes. I did talk to Michelle Beck about Michelle Beck lower extremity swelling and explained that Michelle Requip may be to blame in part for that. Michelle Beck has a family history of Parkinson's disease and Michelle Beck Michelle Beck daughter has been diagnosed with young onset Parkinson's disease recently.     Michelle Beck previously followed with Dr. Morene Antu and was last seen by him on 04/27/2012, at which time he felt that Michelle Beck was stable and he initiated a trial of Cymbalta for Michelle Beck back pain, but Michelle Beck stopped it as it did not help.    Michelle Beck was treated by Dr. Germain Osgood in [place] with Mirapex without benefit. In December 2008 Michelle Beck started seeing Dr. Erling Cruz. Michelle Beck was started on Requip but Michelle Beck  developed nausea. Amantadine  was added. Michelle Beck denied hallucinations or memory loss and has had bizarre dreams and symptoms suggestive of RBD. Michelle Beck had drooling which did not improve with Botox injections. Michelle Beck exercises regularly. Michelle Beck has had multiple falls. In May 2012 Michelle Beck fell and fractured Michelle right hip requiring surgery. Michelle Beck also fractured Michelle Beck right femur in June 2012. Michelle Beck has fracture 2 vertebral bodies in January 2013 requiring surgery. Michelle Beck had a TENS unit and epidural injections and Lidoderm patches without benefit.   Michelle Beck Past Medical History Is Significant For: Past Medical History:  Diagnosis Date  . Closed fracture of three ribs   . Closed fracture of unspecified part of neck of femur   . Closed fracture of unspecified part of vertebral column without mention of spinal cord injury   . Disturbance of salivary secretion   . Fall   . GERD (gastroesophageal reflux disease)   . Hypertension   . Osteoporosis, unspecified   . Pain in thoracic spine   . Paralysis agitans ([place])   . Parkinson's disease ([place])   . Scoliosis (and kyphoscoliosis), idiopathic   . Unspecified adverse effect of unspecified drug, medicinal and biological substance   . Unspecified disorder of plasma protein metabolism     Michelle Beck Past Surgical History Is Significant For: Past Surgical History:  Procedure Laterality Date  . HERNIA REPAIR  2002  . HIP SURGERY      Michelle Beck Family History Is Significant For: Family History  Problem Relation Age of Onset  . Heart failure Mother   . Heart disease Mother   . Emphysema Father   . COPD Father   . Diabetes Sister     Michelle Beck Social History Is Significant For: Social History   Socioeconomic History  . Marital status: Married    Spouse name: None  . Number of children: None  . Years of education: None  . Highest education level: None  Social Needs  . Financial resource strain: None  . Food insecurity - worry: None  . Food insecurity - inability: None  . Transportation  needs - medical: None  . Transportation needs - non-medical: None  Occupational History    Comment: retired  Tobacco Use  . Smoking status: Never Smoker  . Smokeless tobacco: Never Used  Substance and Sexual Activity  . Alcohol use: No    Alcohol/week: 0.0 oz  . Drug use: No  . Sexual activity: None  Other Topics Concern  . None  Social History Narrative   Lives at home w/ Michelle Beck husband    Michelle Beck Allergies Are:  Allergies  Allergen Reactions  . Fentanyl Anxiety and Shortness Of Breath  . Actonel [Risedronate Sodium]     Reaction unknown  . Hydrocodone     Reaction unknown  . Iodine   . Penicillins   . Shellfish Allergy     Reaction unknown  :   Michelle Beck Current Medications Are:  Outpatient Encounter Medications as of 03/04/2017  Medication Sig  . amLODipine (NORVASC) 2.5 MG tablet   . calcium citrate (CALCITRATE - DOSED IN MG ELEMENTAL CALCIUM) 950 MG tablet Take 1 tablet by mouth 3 (three) times daily.  . Carbidopa-Levodopa ER (SINEMET CR) 25-100 MG tablet controlled release Take 1 tablet by mouth 5 (five) times daily. 5:30 AM, 9:30 AM, 1:30 PM, 5:30 PM, 9:30 PM (Beck taking differently: Take 1 tablet by mouth 4 (four) times daily. 5:30 AM, 9:30 AM, 1:30 PM, 5:30 PM, 9:30 PM)  . cholecalciferol (VITAMIN D) 400 UNITS TABS Take 400 Units  by mouth daily.   Marland Kitchen denosumab (PROLIA) 60 MG/ML SOLN injection Inject 60 mg into Michelle skin every 6 (six) months. Administer in upper arm, thigh, or abdomen  . ibuprofen (ADVIL,MOTRIN) 200 MG tablet Take 1 tablet (200 mg total) by mouth every 8 (eight) hours as needed for moderate pain (with food).  . metoprolol tartrate (LOPRESSOR) 25 MG tablet Take 12.5 mg by mouth daily.   Marland Kitchen rOPINIRole (REQUIP) 2 MG tablet Take 1 tablet (2 mg total) by mouth 3 (three) times daily.   No facility-administered encounter medications on file as of 03/04/2017.   :  Review of Systems:  Out of a complete 14 point review of systems, all are reviewed and negative with  Michelle exception of these symptoms as listed below: Review of Systems  Neurological:       Pt presents today to discuss Michelle Beck PD. Pt says that Michelle Beck is only taking Michelle sinemet CR 4 times daily as opposed to 5 times daily, as ordered.    Objective:  Neurological Exam  Physical Exam   Physical Examination:   Vitals:   03/04/17 1413  BP: 123/73  Pulse: 73   General Examination: Michelle Beck is a very pleasant 83 y.o. female in no acute distress. Michelle Beck appears frail. In [place]. Well groomed.   HEENT: Normocephalic, atraumatic, pupils are equal, round and reactive to light and accommodation. Status post left cataract repair. On extraocular tracking Michelle Beck has moderate difficulty, some limitation to upper gaze. Hearing is mildly impaired. Neck is moderately rigid. Oropharynx exam reveals mild mouth dryness, no significant airway crowding. Tongue and palate are symmetrical and central. Michelle Beck has no lip, neck or jaw tremor. Michelle Beck has mild excess moisture at Michelle right corner of Michelle mouth. Michelle Beck has a swelling over Michelle right parotid gland area. Michelle Beck reports that Michelle Beck had it looked at by ENT and dermatology.  Chest: Clear to auscultation without wheezing, rhonchi or crackles noted.  Heart: S1+S2+0, regular and normal without murmurs, rubs or gallops noted.   Abdomen: Soft, non-tender and non-distended with normal bowel sounds appreciated on auscultation.  Extremities: There is trace pitting edema in Michelle distal lower extremities bilaterally.  Skin: Warm and dry without trophic changes noted, except stable discoloration of skin in distal legs.  Musculoskeletal: exam reveals no obvious joint deformities, tenderness or joint swelling or erythema, c/o L arm pain where Michelle Beck previously fractured it and right thigh pain where Michelle Beck previously injured Michelle Beck leg.  Neurologically:  Mental status: Michelle Beck is awake, alert and oriented in all 4 spheres. Michelle Beck immediate and remote memory, attention, language skills and fund of  knowledge are fairly well-preserved. There is no evidence of aphasia, agnosia, apraxia or anomia. Speech is clear with normal prosody and enunciation. Thought process is linear. Mood is normal and affect is normal.  Cranial nerves II - XII are as described above under HEENT exam. In addition: shoulder shrug is normal with equal shoulder height noted. Motor exam: Normal bulk,  global strength of 4 out of 5, no drift, mild intermittent resting tremor in Michelle left upper extremity, minimal postural tremor in Michelle left upper extremity only. Michelle Beck has moderate impairment of fine motor skills may be severely impaired in Michelle lower extremities, overall findings are worse on Michelle left side.   Cerebellar testing: No dysmetria or intention tremor, heel-to-shin is not possible for Michelle Beck.  Sensory exam: intact to light touch in Michelle upper and lower extremities.  Gait, station and balance:  I did not have  Michelle Beck stand or walk for me today as Michelle Beck did not bring Michelle Beck walker.   Assessment and Plan:   In summary, AYRIANA WIX is a very pleasant 83 year old female with an underlying complex medical history of hypertension, chronic back pain, status post back surgery, hip pain with status post right hip surgery in 2009, history of scoliosis, osteoporosis, history of falls and compression fractures as well as rib fractures, hyperlipidemia, and overweight state, who presents for follow-up consultation of Michelle Beck advanced Parkinson's disease, with left-sided predominant findings, complicated by falls, abnormal posture, history of REM behavior disorder, lower extremity edema, constipation and overall frailty and Michelle challenges of age. We had in Michelle past reduced Michelle Beck Requip to 2 mg 3 times a day. Michelle Beck has had ongoing issues with nausea. We changed Michelle Beck from regular Sinemet to Sinemet long-acting. Michelle Beck is taking one pill 4 times a day. Michelle Beck had a reaction or side effects to Forteo in Michelle recent past. Michelle Beck has had ongoing issues with nausea and  constipation. I had advised Michelle Beck to seek an opinion with GI but Michelle Beck did not pursue it. Michelle Beck is advised, that we could try to wean Michelle Beck off of Michelle Requip by lowering to half a pill 3 times a day for a couple weeks then half a pill twice a day for a few days, then half a pill once daily for a few days and stop. This in Michelle hope that Michelle Beck nausea may improve. Michelle Beck may have repercussions motor-wise and his overall reluctant to pursue this. Nevertheless, Michelle Beck is advised to consider it as both Michelle Beck Parkinson's medications are known to cause nausea as a side effect. Between Michelle 2 medications I suggested we keep Michelle Beck on Sinemet CR. I would be reluctant to take Michelle Beck off of this. I suggested a 6 month follow-up, sooner as needed. I answered all their questions today and Michelle Beck and Michelle Beck husband were in agreement.  I spent 25 minutes in total face-to-face time with Michelle Beck, more than 50% of which was spent in counseling and coordination of care, reviewing test results, reviewing medication and discussing or reviewing Michelle diagnosis of PD, its prognosis and treatment options. Pertinent laboratory and imaging test results that were available during this visit with Michelle Beck were reviewed by me and considered in my medical decision making (see chart for details).

## 2017-04-15 ENCOUNTER — Telehealth: Payer: Self-pay | Admitting: Neurology

## 2017-04-15 NOTE — Telephone Encounter (Signed)
I called pt. She is even more nauseous after tapering the requip down to 1/2 tablet BID. She wants to know if she should go back up to 1/2 tablet TID. Pt has not yet seen a GI doctor.  I spoke to Dr. Frances FurbishAthar. She recommends that pt continue tapering off of requip as directed and see a GI doctor.   I called pt and advised her of this information. She will speak to Dr. Hyacinth MeekerMiller about a GI referral and continue tapering off of requip.

## 2017-04-15 NOTE — Telephone Encounter (Signed)
Pt called she is has tapered down on rOPINIRole (REQUIP) 2 MG tablet to 1/2 bid but she is still nauseated in the morning. Pt said for the past 2-3 days she has been sick all day. She is wanting to know should she stop it. Please call to advise.

## 2017-04-20 NOTE — Telephone Encounter (Signed)
Pt has called to inform that she is feeling more and more sick and knows that she will not be able to come off of the rOPINIRole (REQUIP) 2 MG tablet.  Pt asking to be called

## 2017-04-21 NOTE — Telephone Encounter (Signed)
I would recommend she try to stay on 2 mg strength half a pill 3 times a day for the ropinirole at this time. Again, she may benefit from seeing a GI doctor for her constant nausea.

## 2017-04-21 NOTE — Telephone Encounter (Signed)
I spoke to pt and pt's husband, advised them that pt should try to stay on the requip 2mg  tablet 1/2 tablet TID. Pt is asking if she can go back to taking requip 1 tablet TID instead. I spoke with Dr. Frances FurbishAthar, she is agreeable to that. I encouraged pt strongly to see a GI physician, pt reports that she has an appt tomorrow with a GI physician. Pt verbalized understanding of Dr. Teofilo PodAthar's recommendations.

## 2017-06-09 ENCOUNTER — Other Ambulatory Visit: Payer: Self-pay | Admitting: Gastroenterology

## 2017-06-09 DIAGNOSIS — R1013 Epigastric pain: Secondary | ICD-10-CM

## 2017-06-12 ENCOUNTER — Ambulatory Visit: Payer: Medicare Other

## 2017-06-15 ENCOUNTER — Ambulatory Visit
Admission: RE | Admit: 2017-06-15 | Discharge: 2017-06-15 | Disposition: A | Discharge status: Home / Self Care | Payer: Medicare Other | Source: Ambulatory Visit | Attending: Gastroenterology | Admitting: Gastroenterology

## 2017-06-15 DIAGNOSIS — R1013 Epigastric pain: Secondary | ICD-10-CM | POA: Insufficient documentation

## 2017-06-15 DIAGNOSIS — K228 Other specified diseases of esophagus: Secondary | ICD-10-CM | POA: Diagnosis not present

## 2017-06-26 ENCOUNTER — Other Ambulatory Visit: Payer: Self-pay | Admitting: Neurology

## 2017-06-26 ENCOUNTER — Other Ambulatory Visit: Payer: Self-pay | Admitting: Gastroenterology

## 2017-06-26 DIAGNOSIS — R1013 Epigastric pain: Secondary | ICD-10-CM

## 2017-06-26 DIAGNOSIS — G20 Parkinson's disease: Secondary | ICD-10-CM

## 2017-07-11 ENCOUNTER — Encounter
Admission: RE | Admit: 2017-07-11 | Discharge: 2017-07-11 | Disposition: A | Discharge status: Home / Self Care | Payer: Medicare Other | Source: Ambulatory Visit | Attending: Gastroenterology | Admitting: Gastroenterology

## 2017-07-11 DIAGNOSIS — R1013 Epigastric pain: Secondary | ICD-10-CM | POA: Diagnosis not present

## 2017-07-11 MED ORDER — TECHNETIUM TC 99M SULFUR COLLOID
2.0800 | Freq: Once | INTRAVENOUS | Status: AC | PRN
  Administered 2017-07-11: 2.08 via ORAL

## 2017-09-02 ENCOUNTER — Ambulatory Visit (INDEPENDENT_AMBULATORY_CARE_PROVIDER_SITE_OTHER): Payer: Medicare Other | Admitting: Neurology

## 2017-09-02 ENCOUNTER — Encounter: Payer: Self-pay | Admitting: Neurology

## 2017-09-02 VITALS — BP 117/65 | HR 65

## 2017-09-02 DIAGNOSIS — R110 Nausea: Secondary | ICD-10-CM | POA: Diagnosis not present

## 2017-09-02 DIAGNOSIS — G20 Parkinson's disease: Secondary | ICD-10-CM

## 2017-09-02 DIAGNOSIS — R5381 Other malaise: Secondary | ICD-10-CM | POA: Diagnosis not present

## 2017-09-02 DIAGNOSIS — K5909 Other constipation: Secondary | ICD-10-CM

## 2017-09-02 DIAGNOSIS — Z9181 History of falling: Secondary | ICD-10-CM

## 2017-09-02 NOTE — Progress Notes (Signed)
Subjective:    Patient ID: Michelle Beck is a 84 y.o. female.  HPI     Interim history:   Michelle Beck is an 84 year old right-handed woman with an underlying medical history of hyperlipidemia, hypertension, chronic back, s/p back surgery, hip pain, s/p R hip surgery in 2009, scoliosis, osteoporosis, compression fractures and rib fractures, s/p hernia surgery on the right in 2002, who presents for followup consultation of her left-sided predominant Parkinson's disease of about 10 years' duration, complicated by lower extremity edema, sialorrhea (which did not respond to botulinum toxin injection in the past), falls, RBD and overall frailty with advancing age. She is accompanied by her husband again today, on their anniversary. I last saw her on 03/04/2017, at which time she reported ongoing issues with nausea. She did not see a difference in taking Sinemet CR 5 times a day, therefore went back to taking it 4 times a day. She was on Requip 2 mg 3 times a day. We reduced it in the past because of lower extremity swelling. Her swelling improved after we reduced the dose. She had constipation but lately under better control. She reported right leg weakness and right lower back pain.   In the interim, she called with ongoing issues with nausea. She was advised to reduce her Requip to half a pill 3 times a day. She was advised to seek consultation with GI, she saw a GI specialist. She had an upper GI diagnostic x-ray with small bowel follow-through which showed primarily age-related findings, mild reflux. She had a gastric emptying test in April 2019 which showed benign findings.   Today, 09/02/2017: She reports ongoing issues with nausea, no new motor symptoms, tries to drink Glucerna. No falls, feels, that nausea is worse.    The patient's allergies, current medications, family history, past medical history, past social history, past surgical history and problem list were reviewed and updated as  appropriate.    Previously (copied from previous notes for reference):    I saw her on 05/05/2016, which time she reported feeling fairly stable. She could not tolerate her osteoporosis medicine. She was still complaining about nausea. She had issues with chronic constipation but was managing. I suggested we continue with Sinemet 1 pill 4 times a day and Requip 3 times a day.   She saw Jannette Spanner in the interim on 09/02/2016, at which time she was deemed fairly stable and her medications were continued.      I saw her on 12/31/2015, at which time she reported having nausea with her Sinemet even with the CR. She did not think changing to a CR made a difference. She was worried about having to take Forteo for her osteoporosis. She had ongoing issues with low back pain and having more difficulty walking. She was mostly in her wheelchair or sitting down on a chair. I encouraged her to talk to her primary care physician about seeing a GI specialist for ongoing issues with nausea. I suggested we continue ropinirole 2 mg 3 times a day and Sinemet CR 1 pill 4 times a day.   I saw her on 09/24/15, at which time she reported feeling weaker. She had nausea in the mornings. She could not tolerate Zofran. She felt it made her very constipated. She had mild improvement in her lower extremity swelling. She had called on 09/06/2015 complaining of nausea and not doing well. I did decrease her Sinemet to half a pill 4 times a day and also called in  a prescription for Zofran at the time. I suggested we switch her to Sinemet CR one pill 4 times a day. I asked her to stop the immediate release Sinemet which was half a pill 4 times a day.   I saw her on 06/25/15, at which time she reported feeling overall more weaker in her legs, it was more difficult for her to turn. She had thankfully not had any falls. She was complaining of fatigue. She had physical therapy and tried home health physical therapy after her  hip surgery but could not continue with the home exercises because her sciatica symptoms flared up. She had been quite reluctant and fearful of making changes to her Parkinson's medication for fear of getting worse and becoming more nervous. She presented to the emergency room on 12/16/2014 with right-sided hip pain. She had x-rays done. She was given anti-inflammatory pain medication. She had since then received injections into her lower back for sciatica and felt improved, albeit for a few days only. At her last visit I increased her Sinemet 5 times a day. I asked her to reduce ropinirole to 2 mg 3 times a day. We talked about her leg swelling and how amlodipine can also cause leg swelling. We continued with home health PT. I extended this in May 2017.   I saw her on 12/05/2014, at which time she reported having more difficulty with stiffness and leg weakness. She had been off of the amantadine. She tried to increase the Sinemet to 1 pill 3 times a day but felt worse, as she was struggling also with nausea every morning. She felt more jittery and went back to just half a pill 3 times a day. She will take it at 9:30, 3:30 and 9:30. She was on Requip 3 mg 3 times a day, first dose at 7:30 AM. Her leg swelling was a little better, mood and memory were stable. She reported right hip pain. She was not able to exercise very much. We increased the C/L to 1/2 pill qid.    I saw her on 07/31/2014, at which time she reported feeling more stiff, she had more constipation, more drooling. She had more right hip pain after using a pedaling device that her son bought for her. The first day she used it for 15 minutes and then the next day she used it again for 15 minutes and started having pain. Since then she has used it only once more. She was on amantadine 100 mg once daily, Sinemet half a pill 3 times a day and Requip 3 mg 3 times a day. She had lower extremity edema. Her memory was stable and she had not fallen recently.  On examination, her swelling was a little worse. She was advised to increase her water intake and monitor her lower extremity swelling. I suggested we taper off amantadine altogether. She was advised to increase her Sinemet to one whole pill 3 times a day. I kept the Requip the same.   I saw her on 05/02/2014, at which time she reported intermittent right leg pain. She described an aching sensation in her right thigh but also groin pain and discomfort at the outer aspect of her right hip. She had ongoing low back pain, radiating to the right. She had not fallen. She had seen orthopedics in the past. She had a right hip fracture and had a partial hip replacement. Her lower extremity swelling was better. I asked her to continue with her Parkinson's medication. I  advised her to see her primary care physician if her right hip pain did not improve with ibuprofen.    I saw her on 01/26/2014, at which time she reported more slowness and her speech was soft. She was bothered by low back pain but had not seen a back specialist. She had tried lower back injections in the past which she felt were not helpful. Physical therapy also did not help much. She was on Requip 3 mg 3 times a day. She did not try her leg compression stockings because she felt it was too hot in the summer. I suggested that we consider reduction in her Requip because of her lower extremity swelling and try to increase her Sinemet. She was reluctant to pursue this at the time. She called back reporting that she felt worse with her legs and that she could hardly walk. I suggested she come in for a sooner than scheduled appointment.    I saw her on 07/26/2013, at which time she reported feeling stable. She was using a cane around the house. She reported some back pain. She was sleeping well. She denied no major issues with her memory, exacerbation of RBD, no depression but had some anxiety, no delusions, some hallucinations and no dyskinesias. She  reported no recent falls. I did not make any changes to her medications as I felt she was fairly stable. I did worry about her lower extremity edema which I felt was due to the Requip. I asked her to elevate her feet and use compression hose is up to her knees during the day. I talked her about potentially reducing Requip down the road and starting her on Sinemet but she was reluctant to try this for fear of side effects with Sinemet.   I saw her on 03/07/2013, at which time I felt her lower extremity edema was improved. She reported constipation from Sinemet and did not endorse much in the way of improvement with Sinemet and therefore we decided to taper her off of it. She was to continue with amantadine and Requip. 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 I considered using Linzess for her constipation down the Road. In the interim, she called back and January of this year and requested to restart Sinemet as she felt that she was worse without it.   I first met her on 12/07/2012 at which time I was worried about her lower extremity edema and I encouraged her to continue using compression stockings to her lower extremities and elevate her legs if possible. I referred her to a vascular specialist. I talked to her about reducing Requip but she was reluctant. I introduced a cautious dose of Sinemet at the time started with half a pill twice daily and with build up to half a pill 3 times a day. I wondered if she had developed livedo reticularis on amantadine.   She did not go to see the vein specialist. She uses her cane more than the walker.     I first met her on 08/24/2012, which time I did not add any new test or make any medication changes. I did talk to her about her lower extremity swelling and explained that the Requip may be to blame in part for that. She has a family history of Parkinson's disease and her her daughter has been diagnosed with young onset Parkinson's disease  recently.     The patient previously followed with Dr. Morene Antu and was last  seen by him on 04/27/2012, at which time he felt that she was stable and he initiated a trial of Cymbalta for her back pain, but she stopped it as it did not help.    She was treated by Dr. Germain Osgood in [place] with Mirapex without benefit. In December 2008 she started seeing Dr. Erling Cruz. She was started on Requip but she developed nausea. Amantadine was added. She denied hallucinations or memory loss and has had bizarre dreams and symptoms suggestive of RBD. She had drooling which did not improve with Botox injections. She exercises regularly. She has had multiple falls. In May 2012 she fell and fractured the right hip requiring surgery. She also fractured her right femur in June 2012. She has fracture 2 vertebral bodies in January 2013 requiring surgery. She had a TENS unit and epidural injections and Lidoderm patches without benefit.   Her Past Medical History Is Significant For: Past Medical History:  Diagnosis Date  . Closed fracture of three ribs   . Closed fracture of unspecified part of neck of femur   . Closed fracture of unspecified part of vertebral column without mention of spinal cord injury   . Disturbance of salivary secretion   . Fall   . GERD (gastroesophageal reflux disease)   . Hypertension   . Osteoporosis, unspecified   . Pain in thoracic spine   . Paralysis agitans ([place])   . Parkinson's disease ([place])   . Scoliosis (and kyphoscoliosis), idiopathic   . Unspecified adverse effect of unspecified drug, medicinal and biological substance   . Unspecified disorder of plasma protein metabolism     Her Past Surgical History Is Significant For: Past Surgical History:  Procedure Laterality Date  . HERNIA REPAIR  2002  . HIP SURGERY      Her Family History Is Significant For: Family History  Problem Relation Age of Onset  . Heart failure Mother   . Heart disease Mother   . Emphysema Father    . COPD Father   . Diabetes Sister     Her Social History Is Significant For: Social History   Socioeconomic History  . Marital status: Married    Spouse name: Not on file  . Number of children: Not on file  . Years of education: Not on file  . Highest education level: Not on file  Occupational History    Comment: retired  Scientific laboratory technician  . Financial resource strain: Not on file  . Food insecurity:    Worry: Not on file    Inability: Not on file  . Transportation needs:    Medical: Not on file    Non-medical: Not on file  Tobacco Use  . Smoking status: Never Smoker  . Smokeless tobacco: Never Used  Substance and Sexual Activity  . Alcohol use: No    Alcohol/week: 0.0 oz  . Drug use: No  . Sexual activity: Not on file  Lifestyle  . Physical activity:    Days per week: Not on file    Minutes per session: Not on file  . Stress: Not on file  Relationships  . Social connections:    Talks on phone: Not on file    Gets together: Not on file    Attends religious service: Not on file    Active member of club or organization: Not on file    Attends meetings of clubs or organizations: Not on file    Relationship status: Not on file  Other Topics Concern  .  Not on file  Social History Narrative   Lives at home w/ her husband    Her Allergies Are:  Allergies  Allergen Reactions  . Fentanyl Anxiety and Shortness Of Breath  . Actonel [Risedronate Sodium]     Reaction unknown  . Hydrocodone     Reaction unknown  . Iodine   . Penicillins   . Shellfish Allergy     Reaction unknown  :   Her Current Medications Are:  Outpatient Encounter Medications as of 09/02/2017  Medication Sig  . amLODipine (NORVASC) 2.5 MG tablet   . calcium citrate (CALCITRATE - DOSED IN MG ELEMENTAL CALCIUM) 950 MG tablet Take 1 tablet by mouth 3 (three) times daily.  . Carbidopa-Levodopa ER (SINEMET CR) 25-100 MG tablet controlled release Take 1 tablet by mouth 5 (five) times daily. 5:30 AM,  9:30 AM, 1:30 PM, 5:30 PM, 9:30 PM (Patient taking differently: Take 1 tablet by mouth 4 (four) times daily. 5:30 AM, 9:30 AM, 1:30 PM, 5:30 PM, 9:30 PM)  . cholecalciferol (VITAMIN D) 400 UNITS TABS Take 400 Units by mouth daily.   Marland Kitchen denosumab (PROLIA) 60 MG/ML SOLN injection Inject 60 mg into the skin every 6 (six) months. Administer in upper arm, thigh, or abdomen  . ibuprofen (ADVIL,MOTRIN) 200 MG tablet Take 1 tablet (200 mg total) by mouth every 8 (eight) hours as needed for moderate pain (with food).  . metoprolol tartrate (LOPRESSOR) 25 MG tablet Take 12.5 mg by mouth daily.   Marland Kitchen omeprazole (PRILOSEC) 40 MG capsule Take 40 mg by mouth daily.  Marland Kitchen rOPINIRole (REQUIP) 2 MG tablet Take 1 tablet (2 mg total) by mouth 3 (three) times daily.   No facility-administered encounter medications on file as of 09/02/2017.   :  Review of Systems:  Out of a complete 14 point review of systems, all are reviewed and negative with the exception of these symptoms as listed below:  Review of Systems  Neurological:       Pt presents today to discuss her PD. Pt is taking sinemet 4 times daily. Pt is still having nausea.    Objective:  Neurological Exam  Physical Exam Physical Examination:   Vitals:   09/02/17 1412  BP: 117/65  Pulse: 65   General Examination: The patient is a very pleasant 84 y.o. female in no acute distress. She appears frail, in [facility], well groomed.   HEENT:Normocephalic, atraumatic, pupils are equal, round and reactive to light and accommodation. Status post left cataract repair. On extraocular tracking she has moderate difficulty, some limitation to upper gaze. Hearing is mildly impaired. Neck is moderately rigid. Oropharynx exam reveals mild mouth dryness, no significant airway crowding. Tongue and palate are symmetrical and central.She has no lip, neck or jaw tremor. She has has a swelling over the right parotid gland area, slightly bigger? (She reports that she had it looked at  by ENT and dermatology).  Chest:Clear to auscultation without wheezing, rhonchi or crackles noted.  Heart:S1+S2+0, regular and normal without murmurs, rubs or gallops noted.   Abdomen:Soft, non-tender and non-distended with normal bowel sounds appreciated on auscultation.  Extremities:There istracepitting edema in the distal lower extremities bilaterally.  Skin: Warm and dry without trophic changes noted, except stable discoloration of skin in distal legs.  Musculoskeletal: exam reveals no obvious joint deformities, tenderness or joint swelling or erythema, c/o L arm painwhere she previously fractured it and right thigh pain where she previously injured her leg.  Neurologically:  Mental status: The patient is awake,  alert and oriented in all 4 spheres.Herimmediate and remote memory, attention, language skills and fund of knowledge are fairly well-preserved. There is no evidence of aphasia, agnosia, apraxia or anomia. Speech is clear with normal prosody and enunciation. Thought process is linear. Mood isnormaland affect is normal.    Cranial nerves II - XII are as described above under HEENT exam.   Motor exam: Normal bulk,global strength of 4 out of 5, no drift, mild intermittent resting tremor in the left upper extremity, minimal postural tremor in the left upper extremity only. She has moderate impairment of fine motor skills may be severely impaired in the lower extremities, overall findings are worse on the left side, but stable from last time.  Cerebellar testing: No dysmetria or intention tremor, heel-to-shin is not possible for her.  Sensory exam: intact to light touchin the upper and lower extremities.  Gait, station and balance: I did not have her stand or walk for me today as she did not bring her walker.   Assessmentand Plan:   In summary,Ely M Parisis a very pleasant 63 year oldfemalewith anunderlying complex medical history of hypertension,  chronic back pain, status post back surgery, hip pain with status post right hip surgery in 2009, history of scoliosis, osteoporosis, history of falls and compression fractures as well as rib fractures, hyperlipidemia, and overweight state, who presents for follow-up consultation of her advanced Parkinson's disease, with left-sided predominant findings, complicated by falls, abnormal posture, history of REM behavior disorder, lower extremity edema, constipation and overall frailty and the challenges of age as well as Medication intolerances in the past and severe nausea which has become worse with time. We had in the past reduced her Requip to 2 mg 3 times a day. She has had ongoing issues with nausea. We changed her from regular Sinemet to Sinemet long-acting, no change in Nausea. She is taking C/L ER one pill 4 times a day. She had a reaction or side effects to Forteo in the recent past. She has had ongoing issues with nausea and constipation. She was seen by GI, and had Upper GI series with small bowel follow through as well as gastric emptying study, all non-revealing, mild GER, and presbyesophagus. She is advised, that she can try to lower her sinemet CR slow and even could try to wean her off of it for a little while to see if her nausea may improve. She may have repercussions motor-wise. She can continue with the Requip. I encouraged her to eat a snack before going to bed so she does not wake up with an empty stomach which may accentuate her nausea in the mornings. She is agreeable to this. We talked about the Duopa some, but she is not interested in any implantable device or treatment option, that would require a procedure.  I suggested a 6 month follow-up, sooner as needed. I answered all their questions today and the patient and her husband were in agreement.  I spent 20 minutes in total face-to-face time with the patient, more than 50% of which was spent in counseling and coordination of care, reviewing  test results, reviewing medication and discussing or reviewing the diagnosis of PD, its prognosis and treatment options. Pertinent laboratory and imaging test results that were available during this visit with the patient were reviewed by me and considered in my medical decision making (see chart for details).

## 2017-09-02 NOTE — Patient Instructions (Signed)
Please eat a snack at night, maybe you don't feel so nauseated in the mornings. You can also try to taper down or even off for a few days of the Sinement CR.

## 2017-09-22 ENCOUNTER — Encounter: Payer: Self-pay | Admitting: *Deleted

## 2017-09-29 ENCOUNTER — Ambulatory Visit: Payer: Medicare Other | Admitting: Anesthesiology

## 2017-09-29 ENCOUNTER — Ambulatory Visit
Admission: RE | Admit: 2017-09-29 | Discharge: 2017-09-29 | Disposition: A | Discharge status: Home / Self Care | Payer: Medicare Other | Source: Ambulatory Visit | Attending: Ophthalmology | Admitting: Ophthalmology

## 2017-09-29 ENCOUNTER — Encounter
Admission: RE | Disposition: A | Discharge status: Home / Self Care | Payer: Self-pay | Source: Ambulatory Visit | Attending: Ophthalmology

## 2017-09-29 ENCOUNTER — Encounter: Payer: Self-pay | Admitting: Emergency Medicine

## 2017-09-29 DIAGNOSIS — H2511 Age-related nuclear cataract, right eye: Secondary | ICD-10-CM | POA: Insufficient documentation

## 2017-09-29 DIAGNOSIS — J45909 Unspecified asthma, uncomplicated: Secondary | ICD-10-CM | POA: Diagnosis not present

## 2017-09-29 DIAGNOSIS — Z79899 Other long term (current) drug therapy: Secondary | ICD-10-CM | POA: Diagnosis not present

## 2017-09-29 DIAGNOSIS — I10 Essential (primary) hypertension: Secondary | ICD-10-CM | POA: Diagnosis not present

## 2017-09-29 DIAGNOSIS — K219 Gastro-esophageal reflux disease without esophagitis: Secondary | ICD-10-CM | POA: Insufficient documentation

## 2017-09-29 DIAGNOSIS — G20 Parkinson's disease: Secondary | ICD-10-CM | POA: Diagnosis not present

## 2017-09-29 HISTORY — DX: Pneumonia, unspecified organism: J18.9

## 2017-09-29 HISTORY — DX: Cardiac arrhythmia, unspecified: I49.9

## 2017-09-29 HISTORY — DX: Unspecified asthma, uncomplicated: J45.909

## 2017-09-29 HISTORY — DX: Unspecified osteoarthritis, unspecified site: M19.90

## 2017-09-29 HISTORY — PX: CATARACT EXTRACTION W/PHACO: SHX586

## 2017-09-29 SURGERY — PHACOEMULSIFICATION, CATARACT, WITH IOL INSERTION
Anesthesia: Monitor Anesthesia Care | Site: Eye | Laterality: Right | Wound class: "Clean "

## 2017-09-29 MED ORDER — CARBACHOL 0.01 % IO SOLN
INTRAOCULAR | Status: DC | PRN
  Administered 2017-09-29: 0.5 mL via INTRAOCULAR

## 2017-09-29 MED ORDER — FENTANYL CITRATE (PF) 100 MCG/2ML IJ SOLN
INTRAMUSCULAR | Status: DC | PRN
  Administered 2017-09-29: 50 ug via INTRAVENOUS

## 2017-09-29 MED ORDER — ONDANSETRON HCL 4 MG/2ML IJ SOLN
INTRAMUSCULAR | Status: DC | PRN
  Administered 2017-09-29: 4 mg via INTRAVENOUS

## 2017-09-29 MED ORDER — PHENYLEPHRINE HCL 10 % OP SOLN
1.0000 [drp] | OPHTHALMIC | Status: AC
  Administered 2017-09-29 (×4): 1 [drp] via OPHTHALMIC

## 2017-09-29 MED ORDER — MOXIFLOXACIN HCL 0.5 % OP SOLN
OPHTHALMIC | Status: DC | PRN
  Administered 2017-09-29: 0.2 mL via OPHTHALMIC

## 2017-09-29 MED ORDER — MOXIFLOXACIN HCL 0.5 % OP SOLN
OPHTHALMIC | Status: AC
  Filled 2017-09-29: qty 3

## 2017-09-29 MED ORDER — CYCLOPENTOLATE HCL 2 % OP SOLN
OPHTHALMIC | Status: AC
  Administered 2017-09-29: 1 [drp] via OPHTHALMIC
  Filled 2017-09-29: qty 2

## 2017-09-29 MED ORDER — SODIUM CHLORIDE 0.9 % IV SOLN
INTRAVENOUS | Status: DC
  Administered 2017-09-29: 10:00:00 via INTRAVENOUS

## 2017-09-29 MED ORDER — PHENYLEPHRINE HCL 10 % OP SOLN
OPHTHALMIC | Status: AC
  Administered 2017-09-29: 1 [drp] via OPHTHALMIC
  Filled 2017-09-29: qty 5

## 2017-09-29 MED ORDER — POVIDONE-IODINE 5 % OP SOLN
OPHTHALMIC | Status: DC | PRN
  Administered 2017-09-29: 1 via OPHTHALMIC

## 2017-09-29 MED ORDER — MOXIFLOXACIN HCL 0.5 % OP SOLN: 1.0000 [drp] | OPHTHALMIC | Status: DC | PRN

## 2017-09-29 MED ORDER — LIDOCAINE HCL (PF) 4 % IJ SOLN
INTRAMUSCULAR | Status: AC
  Filled 2017-09-29: qty 5

## 2017-09-29 MED ORDER — CYCLOPENTOLATE HCL 2 % OP SOLN
1.0000 [drp] | OPHTHALMIC | Status: AC
  Administered 2017-09-29 (×4): 1 [drp] via OPHTHALMIC

## 2017-09-29 MED ORDER — ONDANSETRON HCL 4 MG/2ML IJ SOLN
INTRAMUSCULAR | Status: AC
  Filled 2017-09-29: qty 2

## 2017-09-29 MED ORDER — TETRACAINE HCL 0.5 % OP SOLN
OPHTHALMIC | Status: AC
  Administered 2017-09-29: 1 [drp] via OPHTHALMIC
  Filled 2017-09-29: qty 4

## 2017-09-29 MED ORDER — EPINEPHRINE PF 1 MG/ML IJ SOLN
INTRAOCULAR | Status: DC | PRN
  Administered 2017-09-29: 11:00:00 via OPHTHALMIC

## 2017-09-29 MED ORDER — NA CHONDROIT SULF-NA HYALURON 40-17 MG/ML IO SOLN
INTRAOCULAR | Status: AC
  Filled 2017-09-29: qty 1

## 2017-09-29 MED ORDER — TETRACAINE HCL 0.5 % OP SOLN
1.0000 [drp] | OPHTHALMIC | Status: AC | PRN
  Administered 2017-09-29 (×5): 1 [drp] via OPHTHALMIC

## 2017-09-29 MED ORDER — EPINEPHRINE PF 1 MG/ML IJ SOLN
INTRAMUSCULAR | Status: AC
  Filled 2017-09-29: qty 2

## 2017-09-29 MED ORDER — NA CHONDROIT SULF-NA HYALURON 40-17 MG/ML IO SOLN
INTRAOCULAR | Status: DC | PRN
  Administered 2017-09-29: 1 mL via INTRAOCULAR

## 2017-09-29 MED ORDER — GLYCOPYRROLATE 0.2 MG/ML IJ SOLN
INTRAMUSCULAR | Status: DC | PRN
  Administered 2017-09-29: 0.1 mg via INTRAVENOUS

## 2017-09-29 MED ORDER — FENTANYL CITRATE (PF) 100 MCG/2ML IJ SOLN
INTRAMUSCULAR | Status: AC
  Filled 2017-09-29: qty 2

## 2017-09-29 MED ORDER — GLYCOPYRROLATE 0.2 MG/ML IJ SOLN
INTRAMUSCULAR | Status: AC
  Filled 2017-09-29: qty 1

## 2017-09-29 MED ORDER — LIDOCAINE HCL (PF) 4 % IJ SOLN
INTRAOCULAR | Status: DC | PRN
  Administered 2017-09-29: 4 mL via OPHTHALMIC

## 2017-09-29 MED ORDER — POVIDONE-IODINE 5 % OP SOLN
OPHTHALMIC | Status: AC
  Filled 2017-09-29: qty 30

## 2017-09-29 SURGICAL SUPPLY — 17 items
GLOVE BIO SURGEON STRL SZ8 (GLOVE) ×3 IMPLANT
GLOVE BIOGEL M 6.5 STRL (GLOVE) ×3 IMPLANT
GLOVE SURG LX 8.0 MICRO (GLOVE) ×2
GLOVE SURG LX STRL 8.0 MICRO (GLOVE) ×1 IMPLANT
GOWN STRL REUS W/ TWL LRG LVL3 (GOWN DISPOSABLE) ×2 IMPLANT
GOWN STRL REUS W/TWL LRG LVL3 (GOWN DISPOSABLE) ×4
LABEL CATARACT MEDS ST (LABEL) ×3 IMPLANT
LENS IOL ACRSF IQ ULTRA 15.5 (Intraocular Lens) IMPLANT
LENS IOL ACRYSOF IQ 15.5 (Intraocular Lens) ×3 IMPLANT
PACK CATARACT (MISCELLANEOUS) ×3 IMPLANT
PACK CATARACT BRASINGTON LX (MISCELLANEOUS) ×3 IMPLANT
PACK EYE AFTER SURG (MISCELLANEOUS) ×3 IMPLANT
SOL BSS BAG (MISCELLANEOUS) ×3
SOLUTION BSS BAG (MISCELLANEOUS) ×1 IMPLANT
SYR 5ML LL (SYRINGE) ×3 IMPLANT
WATER STERILE IRR 250ML POUR (IV SOLUTION) ×3 IMPLANT
WIPE NON LINTING 3.25X3.25 (MISCELLANEOUS) ×3 IMPLANT

## 2017-09-29 NOTE — Anesthesia Post-op Follow-up Note (Signed)
Anesthesia QCDR form completed.        

## 2017-09-29 NOTE — Op Note (Signed)
PREOPERATIVE DIAGNOSIS:  Nuclear sclerotic cataract of the right eye.   POSTOPERATIVE DIAGNOSIS:  NUCLEAR SCLEROTIC CATARACT RIGHT EYE   OPERATIVE PROCEDURE: Procedure(s): CATARACT EXTRACTION PHACO AND INTRAOCULAR LENS PLACEMENT (IOC)   SURGEON:  Galen ManilaWilliam Aasiya Creasey, MD.   ANESTHESIA:  Anesthesiologist: Lenard SimmerKarenz, Andrew, MD CRNA: Sherol DadeMacMang, Josephine H, CRNA  1.      Managed anesthesia care. 2.      0.901ml of Shugarcaine was instilled in the eye following the paracentesis.   COMPLICATIONS:  None.   TECHNIQUE:   Stop and chop   DESCRIPTION OF PROCEDURE:  The patient was examined and consented in the preoperative holding area where the aforementioned topical anesthesia was applied to the right eye and then brought back to the Operating Room where the right eye was prepped and draped in the usual sterile ophthalmic fashion and a lid speculum was placed. A paracentesis was created with the side port blade and the anterior chamber was filled with viscoelastic. A near clear corneal incision was performed with the steel keratome. A continuous curvilinear capsulorrhexis was performed with a cystotome followed by the capsulorrhexis forceps. Hydrodissection and hydrodelineation were carried out with BSS on a blunt cannula. The lens was removed in a stop and chop  technique and the remaining cortical material was removed with the irrigation-aspiration handpiece. The capsular bag was inflated with viscoelastic and the Technis [id number]  lens was placed in the capsular bag without complication. The remaining viscoelastic was removed from the eye with the irrigation-aspiration handpiece. The wounds were hydrated. The anterior chamber was flushed with Miostat and the eye was inflated to physiologic pressure. 0.911ml of Vigamox was placed in the anterior chamber. The wounds were found to be water tight. The eye was dressed with Vigamox. The patient was given protective glasses to wear throughout the day and a shield with  which to sleep tonight. The patient was also given drops with which to begin a drop regimen today and will follow-up with me in one day. Implant Name Type Inv. Item Serial No. Manufacturer Lot No. LRB No. Used  LENS IOL ACRYSOF IQ 15.5 - [id number] Intraocular Lens LENS IOL ACRYSOF IQ 15.5 [id number] ALCON  Right 1   Procedure(s) with comments: CATARACT EXTRACTION PHACO AND INTRAOCULAR LENS PLACEMENT (IOC) (Right) - US 00:39.3 AP% 14.9 CDE 5.85 Fluid pack lot # [id number]  Electronically signed: Galen ManilaWilliam Ediberto Sens 09/29/2017 11:32 AM

## 2017-09-29 NOTE — Transfer of Care (Signed)
Immediate Anesthesia Transfer of Care Note  Patient: Michelle Beck  Procedure(s) Performed: CATARACT EXTRACTION PHACO AND INTRAOCULAR LENS PLACEMENT (IOC) (Right Eye)  Patient Location: PACU and Short Stay  Anesthesia Type:MAC  Level of Consciousness: awake, alert , oriented and patient cooperative  Airway & Oxygen Therapy: Patient Spontanous Breathing  Post-op Assessment: Report given to RN, Post -op Vital signs reviewed and stable and Patient moving all extremities  Post vital signs: Reviewed and stable  Last Vitals:  Vitals Value Taken Time  BP    Temp    Pulse    Resp    SpO2      Last Pain:  Vitals:   09/29/17 1136  TempSrc: Temporal  PainSc:          Complications: No apparent anesthesia complications

## 2017-09-29 NOTE — Anesthesia Postprocedure Evaluation (Signed)
Anesthesia Post Note  Patient: Michelle Beck  Procedure(s) Performed: CATARACT EXTRACTION PHACO AND INTRAOCULAR LENS PLACEMENT (IOC) (Right Eye)  Patient location during evaluation: Short Stay Anesthesia Type: MAC Level of consciousness: awake and alert, oriented and patient cooperative Pain management: satisfactory to patient Vital Signs Assessment: post-procedure vital signs reviewed and stable Respiratory status: spontaneous breathing and respiratory function stable Cardiovascular status: blood pressure returned to baseline and stable Postop Assessment: no headache, no backache, patient able to bend at knees, no apparent nausea or vomiting, adequate PO intake and able to ambulate Anesthetic complications: no     Last Vitals:  Vitals:   09/29/17 1014 09/29/17 1136  BP: 133/70 (!) 150/63  Pulse: 63 (!) 51  Resp: 17 16  Temp: (!) 35.6 C (!) 36.1 C  SpO2: 95% 95%    Last Pain:  Vitals:   09/29/17 1136  TempSrc: Temporal  PainSc:                  Clovis Riley Lena Gores

## 2017-09-29 NOTE — Discharge Instructions (Signed)
Eye Surgery Discharge Instructions    Expect mild scratchy sensation or mild soreness. DO NOT RUB YOUR EYE!  The day of surgery:  Minimal physical activity, but bed rest is not required  No reading, computer work, or close hand work  No bending, lifting, or straining.  May watch TV  For 24 hours:  No driving, legal decisions, or alcoholic beverages  Safety precautions  Eat anything you prefer: It is better to start with liquids, then soup then solid foods.  _____ Eye patch should be worn until postoperative exam tomorrow.  ____ Solar shield eyeglasses should be worn for comfort in the sunlight/patch while sleeping  Resume all regular medications including aspirin or Coumadin if these were discontinued prior to surgery. You may shower, bathe, shave, or wash your hair. Tylenol may be taken for mild discomfort.  Call your doctor if you experience significant pain, nausea, or vomiting, fever > 101 or other signs of infection. [phone] or [phone] Specific instructions:  Follow-up Information    Galen ManilaPorfilio, William, MD Follow up.   Specialty:  Ophthalmology Why:  July 10 at 11:00am Contact information: [address] [place] [address] [phone]

## 2017-09-29 NOTE — Anesthesia Preprocedure Evaluation (Signed)
Anesthesia Evaluation  Patient identified by MRN, date of birth, ID band Patient awake    Reviewed: Allergy & Precautions, H&P , NPO status , Patient's Chart, lab work & pertinent test results, reviewed documented beta blocker date and time   History of Anesthesia Complications Negative for: history of anesthetic complications  Airway Mallampati: III  TM Distance: >3 FB Neck ROM: full    Dental  (+) Caps, Chipped, Dental Advidsory Given   Pulmonary neg shortness of breath, asthma , neg recent URI,           Cardiovascular Exercise Tolerance: Good hypertension, (-) angina(-) CAD, (-) Past MI, (-) Cardiac Stents and (-) CABG + dysrhythmias (-) Valvular Problems/Murmurs     Neuro/Psych negative neurological ROS  negative psych ROS   GI/Hepatic Neg liver ROS, GERD  ,  Endo/Other  diabetes (borderline)  Renal/GU negative Renal ROS  negative genitourinary   Musculoskeletal   Abdominal   Peds  Hematology negative hematology ROS (+)   Anesthesia Other Findings Past Medical History: No date: Arthritis No date: Asthma No date: Closed fracture of three ribs No date: Closed fracture of unspecified part of neck of femur No date: Closed fracture of unspecified part of vertebral column  without mention of spinal cord injury No date: Disturbance of salivary secretion No date: Dysrhythmia No date: Fall No date: GERD (gastroesophageal reflux disease) No date: Hypertension No date: Osteoporosis, unspecified No date: Pain in thoracic spine No date: Paralysis agitans (HCC) No date: Parkinson's disease ([place]) No date: Pneumonia No date: Scoliosis (and kyphoscoliosis), idiopathic No date: Unspecified adverse effect of unspecified drug, medicinal  and biological substance No date: Unspecified disorder of plasma protein metabolism   Reproductive/Obstetrics negative OB ROS                              Anesthesia Physical Anesthesia Plan  ASA: III  Anesthesia Plan: MAC   Post-op Pain Management:    Induction: Intravenous  PONV Risk Score and Plan:   Airway Management Planned: Nasal Cannula  Additional Equipment:   Intra-op Plan:   Post-operative Plan:   Informed Consent: I have reviewed the patients History and Physical, chart, labs and discussed the procedure including the risks, benefits and alternatives for the proposed anesthesia with the patient or authorized representative who has indicated his/her understanding and acceptance.   Dental Advisory Given  Plan Discussed with: Anesthesiologist, CRNA and Surgeon  Anesthesia Plan Comments:         Anesthesia Quick Evaluation

## 2017-09-29 NOTE — H&P (Signed)
All labs reviewed. Abnormal studies sent to patients PCP when indicated.  Previous H&P reviewed, patient examined, there are NO CHANGES.  Michelle Beck

## 2017-10-05 ENCOUNTER — Other Ambulatory Visit: Payer: Self-pay | Admitting: Neurology

## 2017-10-05 DIAGNOSIS — G20 Parkinson's disease: Secondary | ICD-10-CM

## 2017-10-05 NOTE — Telephone Encounter (Signed)
Received VO from Dr. Frances FurbishAthar to change the C/L RX to four times daily instead of 5 times daily, since the pt reported to be only taking the RX 4 times daily.

## 2018-03-02 ENCOUNTER — Encounter: Payer: Self-pay | Admitting: Neurology

## 2018-03-02 ENCOUNTER — Ambulatory Visit (INDEPENDENT_AMBULATORY_CARE_PROVIDER_SITE_OTHER): Payer: Medicare Other | Admitting: Neurology

## 2018-03-02 VITALS — BP 158/82 | HR 77

## 2018-03-02 DIAGNOSIS — R5381 Other malaise: Secondary | ICD-10-CM

## 2018-03-02 DIAGNOSIS — K5909 Other constipation: Secondary | ICD-10-CM

## 2018-03-02 DIAGNOSIS — G20 Parkinson's disease: Secondary | ICD-10-CM

## 2018-03-02 DIAGNOSIS — R110 Nausea: Secondary | ICD-10-CM | POA: Diagnosis not present

## 2018-03-02 DIAGNOSIS — R443 Hallucinations, unspecified: Secondary | ICD-10-CM

## 2018-03-02 MED ORDER — CARBIDOPA-LEVODOPA ER 25-100 MG PO TBCR: 1.0000 | EXTENDED_RELEASE_TABLET | Freq: Four times a day (QID) | ORAL | 1 refills | Status: DC

## 2018-03-02 NOTE — Patient Instructions (Addendum)
Try to keep moving, exercise your legs, even when sitting.   We will keep your Sinemet ER the same, 1 pill 4 times a day.  We will keep the Requip the same.  Unfortunately, there is not much I can do for the nausea.   Constipation can be a big problem in advanced Parkinson's disease. It is important to be proactive: this includes ensuring adequate water intake and mobilization (walking around), utilizing stool softeners as needed, an over-the-counter laxative as needed up to daily if needed, adding a probiotic in pill form or in the form of yogurt can help as well. Sometimes, using a suppository or enema becomes necessary.

## 2018-03-02 NOTE — Progress Notes (Signed)
Subjective:    Patient ID: Michelle Beck is a 84 y.o. female.  HPI     Interim history:   Michelle Beck is an 84 year old right-handed woman with an underlying medical history of hyperlipidemia, hypertension, chronic back, s/p back surgery, hip pain, s/p R hip surgery in 2009, scoliosis, osteoporosis, compression fractures and rib fractures, s/p hernia surgery on the right in 2002, who presents for followup consultation of her left-sided predominant Parkinson's disease of over 10 years' duration, complicated by lower extremity edema, sialorrhea (which did not respond to botulinum toxin injection in the past), falls, RBD and overall frailty with advancing age. She is accompanied by her daughter today. I last saw her on 09/02/2017, at which time she had ongoing issues with nausea, no new motor symptoms. Was supplementing nutrition with Glucerna. No falls were reported. She was advised to reduce her Sinemet CR to see nausea improved.  Today, 03/02/18: She reports ongoing issues with daily nausea. She typically has no vomiting. She has tried nausea medication including Phenergan which made her very sleepy. Per daughter, she tried reducing the Sinemet but went back up to 4 times a day. At one point she tried increasing it to 5 times a day taking an additional pill in the morning around 5 AM but it did not make a big difference so she is back to take it 4 times a day. She has occasional constipation, tries to take a stool softener daily. She has had some intermittent auditory hallucinations particularly when she sleeps on her left side which is the good ear. She hears a man walking down the hallway, does not scare her, does not happen every day and does not typically have visual hallucinations with it. She had right cataract removal in July 2019. She has intermittent increase in drooling. She had tried Botox in the past for this.   The patient's allergies, current medications, family history, past medical  history, past social history, past surgical history and problem list were reviewed and updated as appropriate.    Previously (copied from previous notes for reference):    I saw her on 03/04/2017, at which time she reported ongoing issues with nausea. She did not see a difference in taking Sinemet CR 5 times a day, therefore went back to taking it 4 times a day. She was on Requip 2 mg 3 times a day. We reduced it in the past because of lower extremity swelling. Her swelling improved after we reduced the dose. She had constipation but lately under better control. She reported right leg weakness and right lower back pain.    In the interim, she called with ongoing issues with nausea. She was advised to reduce her Requip to half a pill 3 times a day. She was advised to seek consultation with GI, she saw a GI specialist. She had an upper GI diagnostic x-ray with small bowel follow-through which showed primarily age-related findings, mild reflux. She had a gastric emptying test in April 2019 which showed benign findings.    I saw her on 05/05/2016, which time she reported feeling fairly stable. She could not tolerate her osteoporosis medicine. She was still complaining about nausea. She had issues with chronic constipation but was managing. I suggested we continue with Sinemet 1 pill 4 times a day and Requip 3 times a day.   She saw Michelle Beck in the interim on 09/02/2016, at which time she was deemed fairly stable and her medications were continued.  I saw her on 12/31/2015, at which time she reported having nausea with her Sinemet even with the CR. She did not think changing to a CR made a difference. She was worried about having to take Forteo for her osteoporosis. She had ongoing issues with low back pain and having more difficulty walking. She was mostly in her wheelchair or sitting down on a chair. I encouraged her to talk to her primary care physician about seeing a GI specialist  for ongoing issues with nausea. I suggested we continue ropinirole 2 mg 3 times a day and Sinemet CR 1 pill 4 times a day.   I saw her on 09/24/15, at which time she reported feeling weaker. She had nausea in the mornings. She could not tolerate Zofran. She felt it made her very constipated. She had mild improvement in her lower extremity swelling. She had called on 09/06/2015 complaining of nausea and not doing well. I did decrease her Sinemet to half a pill 4 times a day and also called in a prescription for Zofran at the time. I suggested we switch her to Sinemet CR one pill 4 times a day. I asked her to stop the immediate release Sinemet which was half a pill 4 times a day.   I saw her on 06/25/15, at which time she reported feeling overall more weaker in her legs, it was more difficult for her to turn. She had thankfully not had any falls. She was complaining of fatigue. She had physical therapy and tried home health physical therapy after her hip surgery but could not continue with the home exercises because her sciatica symptoms flared up. She had been quite reluctant and fearful of making changes to her Parkinson's medication for fear of getting worse and becoming more nervous. She presented to the emergency room on 12/16/2014 with right-sided hip pain. She had x-rays done. She was given anti-inflammatory pain medication. She had since then received injections into her lower back for sciatica and felt improved, albeit for a few days only. At her last visit I increased her Sinemet 5 times a day. I asked her to reduce ropinirole to 2 mg 3 times a day. We talked about her leg swelling and how amlodipine can also cause leg swelling. We continued with home health PT. I extended this in May 2017.   I saw her on 12/05/2014, at which time she reported having more difficulty with stiffness and leg weakness. She had been off of the amantadine. She tried to increase the Sinemet to 1 pill 3 times a day but felt worse,  as she was struggling also with nausea every morning. She felt more jittery and went back to just half a pill 3 times a day. She will take it at 9:30, 3:30 and 9:30. She was on Requip 3 mg 3 times a day, first dose at 7:30 AM. Her leg swelling was a little better, mood and memory were stable. She reported right hip pain. She was not able to exercise very much. We increased the C/L to 1/2 pill qid.    I saw her on 07/31/2014, at which time she reported feeling more stiff, she had more constipation, more drooling. She had more right hip pain after using a pedaling device that her son bought for her. The first day she used it for 15 minutes and then the next day she used it again for 15 minutes and started having pain. Since then she has used it only once more. She  was on amantadine 100 mg once daily, Sinemet half a pill 3 times a day and Requip 3 mg 3 times a day. She had lower extremity edema. Her memory was stable and she had not fallen recently. On examination, her swelling was a little worse. She was advised to increase her water intake and monitor her lower extremity swelling. I suggested we taper off amantadine altogether. She was advised to increase her Sinemet to one whole pill 3 times a day. I kept the Requip the same.   I saw her on 05/02/2014, at which time she reported intermittent right leg pain. She described an aching sensation in her right thigh but also groin pain and discomfort at the outer aspect of her right hip. She had ongoing low back pain, radiating to the right. She had not fallen. She had seen orthopedics in the past. She had a right hip fracture and had a partial hip replacement. Her lower extremity swelling was better. I asked her to continue with her Parkinson's medication. I advised her to see her primary care physician if her right hip pain did not improve with ibuprofen.    I saw her on 01/26/2014, at which time she reported more slowness and her speech was soft. She was bothered  by low back pain but had not seen a back specialist. She had tried lower back injections in the past which she felt were not helpful. Physical therapy also did not help much. She was on Requip 3 mg 3 times a day. She did not try her leg compression stockings because she felt it was too hot in the summer. I suggested that we consider reduction in her Requip because of her lower extremity swelling and try to increase her Sinemet. She was reluctant to pursue this at the time. She called back reporting that she felt worse with her legs and that she could hardly walk. I suggested she come in for a sooner than scheduled appointment.    I saw her on 07/26/2013, at which time she reported feeling stable. She was using a cane around the house. She reported some back pain. She was sleeping well. She denied no major issues with her memory, exacerbation of RBD, no depression but had some anxiety, no delusions, some hallucinations and no dyskinesias. She reported no recent falls. I did not make any changes to her medications as I felt she was fairly stable. I did worry about her lower extremity edema which I felt was due to the Requip. I asked her to elevate her feet and use compression hose is up to her knees during the day. I talked her about potentially reducing Requip down the road and starting her on Sinemet but she was reluctant to try this for fear of side effects with Sinemet.   I saw her on 03/07/2013, at which time I felt her lower extremity edema was improved. She reported constipation from Sinemet and did not endorse much in the way of improvement with Sinemet and therefore we decided to taper her off of it. She was to continue with amantadine and Requip. 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 I considered using Linzess for her constipation down the Road. In the interim, she called back and January of this year and requested to restart Sinemet as she felt that she was worse without  it.   I first met her on 12/07/2012 at which time I was worried about her lower extremity edema and I  encouraged her to continue using compression stockings to her lower extremities and elevate her legs if possible. I referred her to a vascular specialist. I talked to her about reducing Requip but she was reluctant. I introduced a cautious dose of Sinemet at the time started with half a pill twice daily and with build up to half a pill 3 times a day. I wondered if she had developed livedo reticularis on amantadine.   She did not go to see the vein specialist. She uses her cane more than the walker.     I first met her on 08/24/2012, which time I did not add any new test or make any medication changes. I did talk to her about her lower extremity swelling and explained that the Requip may be to blame in part for that. She has a family history of Parkinson's disease and her her daughter has been diagnosed with young onset Parkinson's disease recently.     The patient previously followed with Dr. Morene Antu and was last seen by him on 04/27/2012, at which time he felt that she was stable and he initiated a trial of Cymbalta for her back pain, but she stopped it as it did not help.    She was treated by Dr. Germain Osgood in [place] with Mirapex without benefit. In December 2008 she started seeing Dr. Erling Cruz. She was started on Requip but she developed nausea. Amantadine was added. She denied hallucinations or memory loss and has had bizarre dreams and symptoms suggestive of RBD. She had drooling which did not improve with Botox injections. She exercises regularly. She has had multiple falls. In May 2012 she fell and fractured the right hip requiring surgery. She also fractured her right femur in June 2012. She has fracture 2 vertebral bodies in January 2013 requiring surgery. She had a TENS unit and epidural injections and Lidoderm patches without benefit.   Her Past Medical History Is Significant For: Past  Medical History:  Diagnosis Date  . Arthritis   . Asthma   . Closed fracture of three ribs   . Closed fracture of unspecified part of neck of femur   . Closed fracture of unspecified part of vertebral column without mention of spinal cord injury   . Disturbance of salivary secretion   . Dysrhythmia   . Fall   . GERD (gastroesophageal reflux disease)   . Hypertension   . Osteoporosis, unspecified   . Pain in thoracic spine   . Paralysis agitans ([place])   . Parkinson's disease ([place])   . Pneumonia   . Scoliosis (and kyphoscoliosis), idiopathic   . Unspecified adverse effect of unspecified drug, medicinal and biological substance   . Unspecified disorder of plasma protein metabolism     Her Past Surgical History Is Significant For: Past Surgical History:  Procedure Laterality Date  . CATARACT EXTRACTION W/PHACO Right 09/29/2017   Procedure: CATARACT EXTRACTION PHACO AND INTRAOCULAR LENS PLACEMENT (IOC);  Surgeon: Birder Robson, MD;  Location: [facility] ORS;  Service: Ophthalmology;  Laterality: Right;  [place] 00:39.3 AP% 14.9 CDE 5.85 Fluid pack lot # [id number]  . HERNIA REPAIR  2002  . HIP SURGERY      Her Family History Is Significant For: Family History  Problem Relation Age of Onset  . Heart failure Mother   . Heart disease Mother   . Emphysema Father   . COPD Father   . Diabetes Sister     Her Social History Is Significant For: Social History  Socioeconomic History  . Marital status: Married    Spouse name: Not on file  . Number of children: Not on file  . Years of education: Not on file  . Highest education level: Not on file  Occupational History    Comment: retired  Scientific laboratory technician  . Financial resource strain: Not on file  . Food insecurity:    Worry: Not on file    Inability: Not on file  . Transportation needs:    Medical: Not on file    Non-medical: Not on file  Tobacco Use  . Smoking status: Never Smoker  . Smokeless tobacco: Never Used  Substance and  Sexual Activity  . Alcohol use: No    Alcohol/week: 0.0 standard drinks  . Drug use: No  . Sexual activity: Not on file  Lifestyle  . Physical activity:    Days per week: Not on file    Minutes per session: Not on file  . Stress: Not on file  Relationships  . Social connections:    Talks on phone: Not on file    Gets together: Not on file    Attends religious service: Not on file    Active member of club or organization: Not on file    Attends meetings of clubs or organizations: Not on file    Relationship status: Not on file  Other Topics Concern  . Not on file  Social History Narrative   Lives at home w/ her husband    Her Allergies Are:  Allergies  Allergen Reactions  . Fentanyl Anxiety and Shortness Of Breath  . Actonel [Risedronate Sodium] Other (See Comments)    Reaction unknown  . Hydrocodone Other (See Comments)    Reaction unknown  . Iodine Other (See Comments)    Unknown  . Shellfish Allergy Other (See Comments)    Reaction unknown  . Penicillins Rash and Other (See Comments)    Has patient had a PCN reaction causing immediate rash, facial/tongue/throat swelling, SOB or lightheadedness with hypotension: Yes Has patient had a PCN reaction causing severe rash involving mucus membranes or skin necrosis: No Has patient had a PCN reaction that required hospitalization: No Has patient had a PCN reaction occurring within the last 10 years: No If all of the above answers are "NO", then may proceed with Cephalosporin use.   :   Her Current Medications Are:  Outpatient Encounter Medications as of 03/02/2018  Medication Sig  . amLODipine (NORVASC) 2.5 MG tablet Take 2.5 mg by mouth daily.   . Calcium Carbonate-Vitamin D (CALCIUM-VITAMIN D3 PO) Take 1 tablet by mouth 3 (three) times daily.  . Carbidopa-Levodopa ER (SINEMET CR) 25-100 MG tablet controlled release Take 1 tablet by mouth 4 (four) times daily. 9:30 AM, 1:30 PM, 5:30 PM, 9:30 PM  . Cholecalciferol 1000  units capsule Take 1,000 Units by mouth daily.   Marland Kitchen denosumab (PROLIA) 60 MG/ML SOLN injection Inject 60 mg into the skin every 6 (six) months. Administer in upper arm, thigh, or abdomen  . docusate sodium (COLACE) 50 MG capsule Take 50 mg by mouth daily.   Marland Kitchen ibuprofen (ADVIL,MOTRIN) 200 MG tablet Take 1 tablet (200 mg total) by mouth every 8 (eight) hours as needed for moderate pain (with food).  . metoprolol tartrate (LOPRESSOR) 25 MG tablet Take 12.5 mg by mouth daily.   Marland Kitchen omeprazole (PRILOSEC) 40 MG capsule Take 40 mg by mouth daily.  . Polyvinyl Alcohol-Povidone (REFRESH OP) Place 1 drop into both eyes 2 (two)  times daily as needed (for dry eyes).  Marland Kitchen rOPINIRole (REQUIP) 2 MG tablet Take 1 tablet (2 mg total) by mouth 3 (three) times daily.   No facility-administered encounter medications on file as of 03/02/2018.   :  Review of Systems:  Out of a complete 14 point review of systems, all are reviewed and negative with the exception of these symptoms as listed below:  Review of Systems  Neurological:       Pt presents today to discuss her PD. Pt reports that she is still having problems with nausea.    Objective:  Neurological Exam  Physical Exam Physical Examination:   Vitals:   03/02/18 1355  BP: (!) 173/82  Pulse: 77   Upon recheck, her blood pressure was 158/82.  General Examination: The patient is a very pleasant 84 y.o. female in no acute distress. She appears frail, in [facility]. Well groomed, good spirits.  HEENT:Normocephalic, atraumatic, pupils are equal, round and reactive to light and accommodation. Status post left cataract repair. On extraocular tracking she has moderate difficulty, some limitation to upper gaze. Hearing is mildly impaired. Neck is moderately rigid. Oropharynx exam reveals mild mouth dryness, no significant airway crowding. Tongue and palate are symmetrical and central.She has no lip, neck or jaw tremor. She has has a swelling over the right parotid gland  area, seems stable. (She reports that she had it looked at by ENT and dermatology).  Chest:Clear to auscultation without wheezing, rhonchi or crackles noted.  Heart:S1+S2+0, regular and normal without murmurs, rubs or gallops noted.   Abdomen:Soft, non-tender and non-distended with normal bowel sounds appreciated on auscultation.  Extremities:There is no pitting edema in the distal lower extremities bilaterally.  Skin: Warm and dry without trophic changes noted, except stable discoloration of skin in distal legs.  Musculoskeletal: exam reveals no obvious joint deformities, tenderness or joint swelling or erythema, has intermittent left arm painwhere she previously fractured it and right thigh pain where she previously injured her leg, all stable.  Neurologically:  Mental status: The patient is awake, alert and oriented in all 4 spheres.Herimmediate and remote memory, attention, language skills and fund of knowledge are fairly well-preserved. There is no evidence of aphasia, agnosia, apraxia or anomia. Speech is clear with normal prosody and enunciation. Thought process is linear. Mood isnormaland affect is normal.    Cranial nerves II - XII are as described above under HEENT exam.   Motor exam: Normal bulk,global strength of 4 out of 5, no drift, mild intermittent resting tremor in the left upper extremity, minimal postural tremor in the left upper extremity only. She has moderate impairment of fine motor skills may be severely impaired in the lower extremities, overallfindings are worse on the left side, but stable from last time.  Cerebellar testing: No dysmetria or intention tremor,heel-to-shin is not possible for her.  Sensory exam: intact to light touchin the upper and lower extremities.  Gait, station and balance:I did not have her stand or walk for me today as she did not bring her walker. She is quite stooped in her wheelchair, she also has  scoliosis.  Assessmentand Plan:   In summary,Tisheena M Parisis a very pleasant 72 year oldfemalewith anunderlying complex medical history of hypertension, chronic back pain, status post back surgery, hip pain with status post right hip surgery in 2009, history of scoliosis, osteoporosis, history of falls and compression fractures as well as rib fractures, hyperlipidemia, and overweight state, whopresents for follow-up consultation of her advanced Parkinson's disease, with left-sided  predominant findings, complicated by falls, abnormal posture, history of REM behavior disorder, lower extremity edema, constipation, intermittent hallucinations,and overall frailty and the challenges of ageing as well as Medication intolerances in the past and severe nausea which has become worse with time. We had in the past reduced herRequip to2 mg 3 times a day. She has had ongoing issues with nausea. We changed her from regular Sinemet to Sinemet long-acting, no change in Nausea. She is taking C/L ER one pill 4 times a day. She had a reaction or side effects to Forteoin the past.She has had ongoing issues with nausea and constipation.She was seen by GI, and had Upper GI series with small bowel follow through as well as gastric emptying study, all non-revealing, mild GER, and presbyesophagus.She tried lowering the Sinemet CR but it did not improve the nausea and decreased her mobility so she went back up on the Sinemet CR to 4 times a day. She can continue with the Requip. We talked about the use of Duopa in the past, but she is not interested in any implantable device or treatment option, that would require a procedure.  I suggested a 6 month follow-up, sooner as needed. I answered all their questions today and the patient and her daughter were in agreement. I spent 25 minutes in total face-to-face time with the patient, more than 50% of which was spent in counseling and coordination of care, reviewing test  results, reviewing medication and discussing or reviewing the diagnosis of PD, its prognosis and treatment options. Pertinent laboratory and imaging test results that were available during this visit with the patient were reviewed by me and considered in my medical decision making (see chart for details).

## 2018-03-22 ENCOUNTER — Other Ambulatory Visit: Payer: Self-pay | Admitting: Neurology

## 2018-03-22 DIAGNOSIS — G20 Parkinson's disease: Secondary | ICD-10-CM

## 2018-07-07 ENCOUNTER — Other Ambulatory Visit: Payer: Self-pay | Admitting: Neurology

## 2018-07-07 DIAGNOSIS — G20 Parkinson's disease: Secondary | ICD-10-CM

## 2018-09-01 ENCOUNTER — Ambulatory Visit: Payer: Medicare Other | Admitting: Neurology

## 2018-09-14 ENCOUNTER — Encounter: Payer: Self-pay | Admitting: Emergency Medicine

## 2018-09-14 ENCOUNTER — Emergency Department: Payer: Medicare Other

## 2018-09-14 ENCOUNTER — Other Ambulatory Visit: Payer: Self-pay

## 2018-09-14 ENCOUNTER — Inpatient Hospital Stay
Admission: EM | Admit: 2018-09-14 | Discharge: 2018-09-16 | DRG: 072 | Disposition: A | Discharge status: Home / Self Care | Payer: Medicare Other | Attending: Internal Medicine | Admitting: Internal Medicine

## 2018-09-14 ENCOUNTER — Inpatient Hospital Stay: Payer: Medicare Other

## 2018-09-14 DIAGNOSIS — Z91041 Radiographic dye allergy status: Secondary | ICD-10-CM | POA: Diagnosis not present

## 2018-09-14 DIAGNOSIS — Z880 Allergy status to penicillin: Secondary | ICD-10-CM | POA: Diagnosis not present

## 2018-09-14 DIAGNOSIS — M1990 Unspecified osteoarthritis, unspecified site: Secondary | ICD-10-CM | POA: Diagnosis present

## 2018-09-14 DIAGNOSIS — Z96641 Presence of right artificial hip joint: Secondary | ICD-10-CM | POA: Diagnosis present

## 2018-09-14 DIAGNOSIS — Z885 Allergy status to narcotic agent status: Secondary | ICD-10-CM | POA: Diagnosis not present

## 2018-09-14 DIAGNOSIS — F0280 Dementia in other diseases classified elsewhere without behavioral disturbance: Secondary | ICD-10-CM | POA: Diagnosis present

## 2018-09-14 DIAGNOSIS — Z8701 Personal history of pneumonia (recurrent): Secondary | ICD-10-CM | POA: Diagnosis not present

## 2018-09-14 DIAGNOSIS — Z833 Family history of diabetes mellitus: Secondary | ICD-10-CM

## 2018-09-14 DIAGNOSIS — M810 Age-related osteoporosis without current pathological fracture: Secondary | ICD-10-CM | POA: Diagnosis present

## 2018-09-14 DIAGNOSIS — Z1159 Encounter for screening for other viral diseases: Secondary | ICD-10-CM | POA: Diagnosis not present

## 2018-09-14 DIAGNOSIS — J45909 Unspecified asthma, uncomplicated: Secondary | ICD-10-CM | POA: Diagnosis present

## 2018-09-14 DIAGNOSIS — Z8249 Family history of ischemic heart disease and other diseases of the circulatory system: Secondary | ICD-10-CM

## 2018-09-14 DIAGNOSIS — R4182 Altered mental status, unspecified: Secondary | ICD-10-CM | POA: Diagnosis not present

## 2018-09-14 DIAGNOSIS — Z961 Presence of intraocular lens: Secondary | ICD-10-CM | POA: Diagnosis present

## 2018-09-14 DIAGNOSIS — K219 Gastro-esophageal reflux disease without esophagitis: Secondary | ICD-10-CM | POA: Diagnosis present

## 2018-09-14 DIAGNOSIS — Z91013 Allergy to seafood: Secondary | ICD-10-CM | POA: Diagnosis not present

## 2018-09-14 DIAGNOSIS — G9341 Metabolic encephalopathy: Secondary | ICD-10-CM | POA: Diagnosis present

## 2018-09-14 DIAGNOSIS — Z515 Encounter for palliative care: Secondary | ICD-10-CM | POA: Diagnosis not present

## 2018-09-14 DIAGNOSIS — Z66 Do not resuscitate: Secondary | ICD-10-CM | POA: Diagnosis present

## 2018-09-14 DIAGNOSIS — Z9841 Cataract extraction status, right eye: Secondary | ICD-10-CM

## 2018-09-14 DIAGNOSIS — G20 Parkinson's disease: Secondary | ICD-10-CM | POA: Diagnosis present

## 2018-09-14 DIAGNOSIS — Z7189 Other specified counseling: Secondary | ICD-10-CM | POA: Diagnosis not present

## 2018-09-14 DIAGNOSIS — M4120 Other idiopathic scoliosis, site unspecified: Secondary | ICD-10-CM | POA: Diagnosis present

## 2018-09-14 DIAGNOSIS — Z825 Family history of asthma and other chronic lower respiratory diseases: Secondary | ICD-10-CM

## 2018-09-14 DIAGNOSIS — Z888 Allergy status to other drugs, medicaments and biological substances status: Secondary | ICD-10-CM

## 2018-09-14 DIAGNOSIS — E119 Type 2 diabetes mellitus without complications: Secondary | ICD-10-CM | POA: Diagnosis present

## 2018-09-14 DIAGNOSIS — I10 Essential (primary) hypertension: Secondary | ICD-10-CM | POA: Diagnosis present

## 2018-09-14 DIAGNOSIS — Z79899 Other long term (current) drug therapy: Secondary | ICD-10-CM

## 2018-09-14 LAB — COMPREHENSIVE METABOLIC PANEL
ALT: 12 U/L (ref 0–44)
AST: 95 U/L — ABNORMAL HIGH (ref 15–41)
Albumin: 3.9 g/dL (ref 3.5–5.0)
Alkaline Phosphatase: 77 U/L (ref 38–126)
Anion gap: 8 (ref 5–15)
BUN: 47 mg/dL — ABNORMAL HIGH (ref 8–23)
CO2: 28 mmol/L (ref 22–32)
Calcium: 10.2 mg/dL (ref 8.9–10.3)
Chloride: 98 mmol/L (ref 98–111)
Creatinine, Ser: 1.08 mg/dL — ABNORMAL HIGH (ref 0.44–1.00)
GFR calc Af Amer: 54 mL/min — ABNORMAL LOW (ref >60)
GFR calc non Af Amer: 46 mL/min — ABNORMAL LOW (ref >60)
Glucose, Bld: 141 mg/dL — ABNORMAL HIGH (ref 70–99)
Potassium: 4.6 mmol/L (ref 3.5–5.1)
Sodium: 134 mmol/L — ABNORMAL LOW (ref 135–145)
Total Bilirubin: 0.9 mg/dL (ref 0.3–1.2)
Total Protein: 7.1 g/dL (ref 6.5–8.1)

## 2018-09-14 LAB — URINALYSIS, ROUTINE W REFLEX MICROSCOPIC
Bilirubin Urine: NEGATIVE
Glucose, UA: NEGATIVE mg/dL
Hgb urine dipstick: NEGATIVE
Ketones, ur: NEGATIVE mg/dL
Leukocytes,Ua: NEGATIVE
Nitrite: NEGATIVE
Protein, ur: 30 mg/dL — AB
Specific Gravity, Urine: 1.012 (ref 1.005–1.030)
pH: 6 (ref 5.0–8.0)

## 2018-09-14 LAB — HEMOGLOBIN A1C
Hgb A1c MFr Bld: 5.7 % — ABNORMAL HIGH (ref 4.8–5.6)
Mean Plasma Glucose: 116.89 mg/dL

## 2018-09-14 LAB — CBC
HCT: 35.8 % — ABNORMAL LOW (ref 36.0–46.0)
Hemoglobin: 12 g/dL (ref 12.0–15.0)
MCH: 30.3 pg (ref 26.0–34.0)
MCHC: 33.5 g/dL (ref 30.0–36.0)
MCV: 90.4 fL (ref 80.0–100.0)
Platelets: 83 10*3/uL — ABNORMAL LOW (ref 150–400)
RBC: 3.96 MIL/uL (ref 3.87–5.11)
RDW: 14.2 % (ref 11.5–15.5)
WBC: 6.2 10*3/uL (ref 4.0–10.5)
nRBC: 0 % (ref 0.0–0.2)

## 2018-09-14 LAB — GLUCOSE, CAPILLARY: Glucose-Capillary: 154 mg/dL — ABNORMAL HIGH (ref 70–99)

## 2018-09-14 LAB — SARS CORONAVIRUS 2 BY RT PCR (HOSPITAL ORDER, PERFORMED IN ~~LOC~~ HOSPITAL LAB): SARS Coronavirus 2: NEGATIVE

## 2018-09-14 LAB — TROPONIN I (HIGH SENSITIVITY)
Troponin I (High Sensitivity): 23 ng/L — ABNORMAL HIGH (ref <18)
Troponin I (High Sensitivity): 16 ng/L (ref <18)

## 2018-09-14 LAB — LACTIC ACID, PLASMA: Lactic Acid, Venous: 1.1 mmol/L (ref 0.5–1.9)

## 2018-09-14 MED ORDER — METOPROLOL TARTRATE 25 MG PO TABS
12.5000 mg | ORAL_TABLET | Freq: Every day | ORAL | Status: DC
  Administered 2018-09-14 – 2018-09-16 (×3): 12.5 mg via ORAL
  Filled 2018-09-14 (×2): qty 1

## 2018-09-14 MED ORDER — ROPINIROLE HCL 1 MG PO TABS
2.0000 mg | ORAL_TABLET | Freq: Once | ORAL | Status: AC
  Administered 2018-09-14: 2 mg via ORAL
  Filled 2018-09-14: qty 2

## 2018-09-14 MED ORDER — SODIUM CHLORIDE 0.9% FLUSH: 3.0000 mL | Freq: Once | INTRAVENOUS | Status: DC

## 2018-09-14 MED ORDER — CARBIDOPA-LEVODOPA ER 25-100 MG PO TBCR
1.0000 | EXTENDED_RELEASE_TABLET | Freq: Four times a day (QID) | ORAL | Status: DC
  Administered 2018-09-14 – 2018-09-16 (×7): 1 via ORAL
  Filled 2018-09-14 (×10): qty 1

## 2018-09-14 MED ORDER — ACETAMINOPHEN 325 MG PO TABS: 650.0000 mg | ORAL_TABLET | Freq: Four times a day (QID) | ORAL | Status: DC | PRN

## 2018-09-14 MED ORDER — HYDRALAZINE HCL 20 MG/ML IJ SOLN
10.0000 mg | Freq: Once | INTRAMUSCULAR | Status: AC
  Administered 2018-09-14: 10 mg via INTRAVENOUS

## 2018-09-14 MED ORDER — SODIUM CHLORIDE 0.9 % IV SOLN
INTRAVENOUS | Status: DC
  Administered 2018-09-14 – 2018-09-16 (×3): via INTRAVENOUS

## 2018-09-14 MED ORDER — PANTOPRAZOLE SODIUM 40 MG PO TBEC
40.0000 mg | DELAYED_RELEASE_TABLET | Freq: Every day | ORAL | Status: DC
  Administered 2018-09-15 – 2018-09-16 (×2): 40 mg via ORAL
  Filled 2018-09-14 (×2): qty 1

## 2018-09-14 MED ORDER — HYDRALAZINE HCL 20 MG/ML IJ SOLN
INTRAMUSCULAR | Status: AC
  Filled 2018-09-14: qty 1

## 2018-09-14 MED ORDER — ENOXAPARIN SODIUM 40 MG/0.4ML ~~LOC~~ SOLN
40.0000 mg | SUBCUTANEOUS | Status: DC
  Administered 2018-09-14 – 2018-09-15 (×2): 40 mg via SUBCUTANEOUS
  Filled 2018-09-14 (×3): qty 0.4

## 2018-09-14 MED ORDER — POLYETHYLENE GLYCOL 3350 17 G PO PACK: 17.0000 g | PACK | Freq: Every day | ORAL | Status: DC | PRN

## 2018-09-14 MED ORDER — SODIUM CHLORIDE 0.9 % IV SOLN
Freq: Once | INTRAVENOUS | Status: AC
  Administered 2018-09-14: 17:00:00 via INTRAVENOUS

## 2018-09-14 MED ORDER — VITAMIN D3 25 MCG (1000 UNIT) PO TABS
1000.0000 [IU] | ORAL_TABLET | Freq: Every day | ORAL | Status: DC
  Administered 2018-09-15 – 2018-09-16 (×2): 1000 [IU] via ORAL
  Filled 2018-09-14 (×4): qty 1

## 2018-09-14 MED ORDER — ACETAMINOPHEN 650 MG RE SUPP: 650.0000 mg | Freq: Four times a day (QID) | RECTAL | Status: DC | PRN

## 2018-09-14 MED ORDER — INSULIN ASPART 100 UNIT/ML ~~LOC~~ SOLN
0.0000 [IU] | Freq: Three times a day (TID) | SUBCUTANEOUS | Status: DC
  Administered 2018-09-15 – 2018-09-16 (×2): 1 [IU] via SUBCUTANEOUS
  Filled 2018-09-14 (×2): qty 1

## 2018-09-14 MED ORDER — INSULIN ASPART 100 UNIT/ML ~~LOC~~ SOLN: 0.0000 [IU] | Freq: Every day | SUBCUTANEOUS | Status: DC

## 2018-09-14 MED ORDER — ROPINIROLE HCL 1 MG PO TABS
2.0000 mg | ORAL_TABLET | Freq: Three times a day (TID) | ORAL | Status: DC
  Administered 2018-09-14 – 2018-09-16 (×5): 2 mg via ORAL
  Filled 2018-09-14 (×5): qty 2

## 2018-09-14 MED ORDER — SODIUM CHLORIDE 0.9 % IV SOLN
1.0000 g | INTRAVENOUS | Status: DC
  Administered 2018-09-14 – 2018-09-15 (×2): 1 g via INTRAVENOUS
  Filled 2018-09-14: qty 10
  Filled 2018-09-14: qty 1

## 2018-09-14 MED ORDER — GLIMEPIRIDE 1 MG PO TABS
1.0000 mg | ORAL_TABLET | Freq: Every day | ORAL | Status: DC
  Administered 2018-09-15 – 2018-09-16 (×2): 1 mg via ORAL
  Filled 2018-09-14 (×2): qty 1

## 2018-09-14 MED ORDER — ONDANSETRON HCL 4 MG/2ML IJ SOLN: 4.0000 mg | Freq: Four times a day (QID) | INTRAMUSCULAR | Status: DC | PRN

## 2018-09-14 MED ORDER — DOCUSATE SODIUM 100 MG PO CAPS
100.0000 mg | ORAL_CAPSULE | Freq: Every day | ORAL | Status: DC
  Administered 2018-09-15 – 2018-09-16 (×2): 100 mg via ORAL
  Filled 2018-09-14 (×2): qty 1

## 2018-09-14 MED ORDER — ONDANSETRON HCL 4 MG PO TABS: 4.0000 mg | ORAL_TABLET | Freq: Four times a day (QID) | ORAL | Status: DC | PRN

## 2018-09-14 NOTE — ED Notes (Signed)
Pt laying flat in bed. Asked if pt is hurting. Pt states her legs are hurting.

## 2018-09-14 NOTE — ED Notes (Signed)
Admitting at the bedside. Patient able to follow commands. Grips equal bilaterally, no slurred speech. Able to close and open mouth on command. Able to stick out tongue and tolerate crushed Meds. Lower abdomen tender upon palpation.

## 2018-09-14 NOTE — ED Notes (Signed)
Pt unable to follow directions to swallow pills. Attempted with apple sauce and pt swallowed applesauce without difficulty but then would spit pill out. Pills crushed and put in applesauce and pt swallowed successfully

## 2018-09-14 NOTE — H&P (Signed)
[facility] [place] [facility]   PATIENT NAME: Michelle Beck    MR#:  [id number]  DATE OF BIRTH:  Jun 18, 1929  DATE OF ADMISSION:  09/14/2018  PRIMARY CARE PHYSICIAN: Danella PentonMiller, Mark F, MD   REQUESTING/REFERRING PHYSICIAN: Daryel NovemberJonathan Williams, MD  CHIEF COMPLAINT:   Chief Complaint  Patient presents with  . Altered Mental Status    HISTORY OF PRESENT ILLNESS:  Michelle Beck  is a 85 y.o. female with a known history of Parkinson's disease, hypertension, asthma, arthritis, GERD.  She arrived to the emergency room via EMS services for altered mental status.  According to her family, patient was normal at baseline around 8 AM becoming more confused and altered around 10 AM.  No recent illness.  No fevers, chills, nausea, vomiting.  She denies chest pain.  Patient denies shortness of breath.  Oxygen saturations are 100% on room air.  However she endorses lower abdominal tenderness.  She denies dysuria.  No history of stroke or MI.  No unilateral weakness or facial drooping noted.  Patient seems slightly confused however no slurred speech.  CT head shows no acute intracranial abnormalities.  Chest x-ray shows no acute pulmonary disease.  Urinalysis demonstrates large bacteria and positive WBCs.  BUN is 47 with creatinine 1.08.  We have admitted her to the hospitalist service for further management.  PAST MEDICAL HISTORY:   Past Medical History:  Diagnosis Date  . Arthritis   . Asthma   . Closed fracture of three ribs   . Closed fracture of unspecified part of neck of femur   . Closed fracture of unspecified part of vertebral column without mention of spinal cord injury   . Disturbance of salivary secretion   . Dysrhythmia   . Fall   . GERD (gastroesophageal reflux disease)   . Hypertension   . Osteoporosis, unspecified   . Pain in thoracic spine   . Paralysis agitans (HCC)   . Parkinson's disease (HCC)   . Pneumonia   . Scoliosis (and kyphoscoliosis), idiopathic    . Unspecified adverse effect of unspecified drug, medicinal and biological substance   . Unspecified disorder of plasma protein metabolism     PAST SURGICAL HISTORY:   Past Surgical History:  Procedure Laterality Date  . CATARACT EXTRACTION W/PHACO Right 09/29/2017   Procedure: CATARACT EXTRACTION PHACO AND INTRAOCULAR LENS PLACEMENT (IOC);  Surgeon: Galen ManilaPorfilio, William, MD;  Location: [facility] ORS;  Service: Ophthalmology;  Laterality: Right;  US 00:39.3 AP% 14.9 CDE 5.85 Fluid pack lot # [id number]  . HERNIA REPAIR  2002  . HIP SURGERY      SOCIAL HISTORY:   Social History   Tobacco Use  . Smoking status: Never Smoker  . Smokeless tobacco: Never Used  Substance Use Topics  . Alcohol use: No    Alcohol/week: 0.0 standard drinks    FAMILY HISTORY:   Family History  Problem Relation Age of Onset  . Heart failure Mother   . Heart disease Mother   . Emphysema Father   . COPD Father   . Diabetes Sister     DRUG ALLERGIES:   Allergies  Allergen Reactions  . Fentanyl Anxiety and Shortness Of Breath  . Actonel [Risedronate Sodium] Other (See Comments)    Reaction unknown  . Hydrocodone Other (See Comments)    Reaction unknown  . Iodine Other (See Comments)    Unknown  . Shellfish Allergy Other (See Comments)    Reaction unknown  . Penicillins Rash and Other (  See Comments)    Has patient had a PCN reaction causing immediate rash, facial/tongue/throat swelling, SOB or lightheadedness with hypotension: Yes Has patient had a PCN reaction causing severe rash involving mucus membranes or skin necrosis: No Has patient had a PCN reaction that required hospitalization: No Has patient had a PCN reaction occurring within the last 10 years: No If all of the above answers are "NO", then may proceed with Cephalosporin use.     REVIEW OF SYSTEMS:   Review of Systems  Constitutional: Positive for malaise/fatigue. Negative for chills and fever.  HENT: Negative for congestion, sinus  pain and sore throat.   Eyes: Negative for blurred vision and double vision.  Respiratory: Negative for cough, sputum production and shortness of breath.   Cardiovascular: Negative for chest pain and palpitations.  Gastrointestinal: Negative for abdominal pain, heartburn, nausea and vomiting.  Genitourinary: Positive for dysuria. Negative for flank pain.  Musculoskeletal: Negative for back pain, myalgias and neck pain.  Skin: Negative.  Negative for itching and rash.  Neurological: Positive for tremors. Negative for dizziness, focal weakness, loss of consciousness and headaches.  Psychiatric/Behavioral: Negative.  Negative for depression.      MEDICATIONS AT HOME:   Prior to Admission medications   Medication Sig Start Date End Date Taking? Authorizing Provider  Carbidopa-Levodopa ER (SINEMET CR) 25-100 MG tablet controlled release Take 1 tablet by mouth 4 (four) times daily. 9:30 AM, 1:30 PM, 5:30 PM, 9:30 PM 03/02/18  Yes Star Age, MD  glimepiride (AMARYL) 1 MG tablet Take 1 mg by mouth daily with breakfast.   Yes [provider]  ibuprofen (ADVIL,MOTRIN) 200 MG tablet Take 1 tablet (200 mg total) by mouth every 8 (eight) hours as needed for moderate pain (with food). 12/16/14  Yes Eula Listen, MD  metoprolol tartrate (LOPRESSOR) 25 MG tablet Take 12.5 mg by mouth daily.  07/19/12  Yes [provider]  rOPINIRole (REQUIP) 2 MG tablet Take 1 tablet (2 mg total) by mouth 3 (three) times daily. 03/22/18  Yes Star Age, MD  Calcium Carbonate-Vitamin D (CALCIUM-VITAMIN D3 PO) Take 1 tablet by mouth 3 (three) times daily.    [provider]  Cholecalciferol 1000 units capsule Take 1,000 Units by mouth daily.     [provider]  denosumab (PROLIA) 60 MG/ML SOLN injection Inject 60 mg into the skin every 6 (six) months. Administer in upper arm, thigh, or abdomen    [provider]  docusate sodium (COLACE) 50 MG capsule Take 50 mg by  mouth daily.     [provider]  omeprazole (PRILOSEC) 40 MG capsule Take 40 mg by mouth daily. 08/31/17   [provider]  Polyvinyl Alcohol-Povidone (REFRESH OP) Place 1 drop into both eyes 2 (two) times daily as needed (for dry eyes).    [provider]      VITAL SIGNS:  Blood pressure (!) 215/107, pulse (!) 53, resp. rate 18, height 5\' 4"  (1.626 m), weight 79.4 kg, SpO2 95 %.  PHYSICAL EXAMINATION:  Physical Exam Constitutional:      General: She is not in acute distress.    Appearance: She is normal weight.  HENT:     Head: Normocephalic.     Right Ear: External ear normal.     Left Ear: External ear normal.     Nose: Nose normal.     Mouth/Throat:     Mouth: Mucous membranes are moist.     Pharynx: Oropharynx is clear.  Eyes:  General: No scleral icterus.    Extraocular Movements: Extraocular movements intact.     Conjunctiva/sclera: Conjunctivae normal.     Pupils: Pupils are equal, round, and reactive to light.  Neck:     Musculoskeletal: Normal range of motion and neck supple.  Cardiovascular:     Rate and Rhythm: Normal rate and regular rhythm.     Pulses: Normal pulses.     Heart sounds: Normal heart sounds. No murmur. No friction rub. No gallop.   Pulmonary:     Effort: Pulmonary effort is normal.     Breath sounds: Normal breath sounds. No wheezing, rhonchi or rales.  Abdominal:     General: Abdomen is flat. There is no distension.     Palpations: Abdomen is soft.     Tenderness: There is abdominal tenderness (lower ebdominal ). There is no right CVA tenderness, left CVA tenderness, guarding or rebound.  Musculoskeletal: Normal range of motion.        General: No tenderness.     Right lower leg: No edema.     Left lower leg: No edema.  Skin:    General: Skin is warm and dry.     Capillary Refill: Capillary refill takes less than 2 seconds.     Coloration: Skin is not jaundiced.     Findings: No bruising or rash.   Neurological:     Mental Status: She is alert and oriented to person, place, and time.     Motor: Weakness present.     Comments: Intermittent confusion  Psychiatric:        Mood and Affect: Mood normal.    LABORATORY PANEL:   CBC Recent Labs  Lab 09/14/18 1404  WBC 6.2  HGB 12.0  HCT 35.8*  PLT 83*   ------------------------------------------------------------------------------------------------------------------  Chemistries  Recent Labs  Lab 09/14/18 1404  NA 134*  K 4.6  CL 98  CO2 28  GLUCOSE 141*  BUN 47*  CREATININE 1.08*  CALCIUM 10.2  AST 95*  ALT 12  ALKPHOS 77  BILITOT 0.9   ------------------------------------------------------------------------------------------------------------------  Cardiac Enzymes No results for input(s): TROPONINI in the last 168 hours. ------------------------------------------------------------------------------------------------------------------  RADIOLOGY:  Dg Chest 1 View  Result Date: 09/14/2018 CLINICAL DATA:  Altered mental status. EXAM: CHEST  1 VIEW COMPARISON:  Chest x-ray dated Aug 16, 2010. FINDINGS: The patient is rotated to the right. The heart size and mediastinal contours are within normal limits. Atherosclerotic calcification of the aortic arch. Normal pulmonary vascularity. No focal consolidation, pleural effusion, or pneumothorax. No acute osseous abnormality. Old lower thoracic kyphoplasties. IMPRESSION: No active disease. Electronically Signed   By: Obie DredgeWilliam T Derry M.D.   On: 09/14/2018 16:27   Ct Head Wo Contrast  Result Date: 09/14/2018 CLINICAL DATA:  Altered mental status. EXAM: CT HEAD WITHOUT CONTRAST TECHNIQUE: Contiguous axial images were obtained from the base of the skull through the vertex without intravenous contrast. COMPARISON:  None. FINDINGS: Brain: No evidence of acute infarction, hemorrhage, hydrocephalus, extra-axial collection or mass lesion/mass effect. Mild generalized cerebral  atrophy. Scattered mild periventricular and subcortical white matter hypodensities are nonspecific, but favored to reflect chronic microvascular ischemic changes. Vascular: Calcified atherosclerosis at the skullbase. No hyperdense vessel. Skull: Normal. Negative for fracture or focal lesion. Sinuses/Orbits: No acute finding. Large retention cyst in the left sphenoid sinus. Other: None. IMPRESSION: 1.  No acute intracranial abnormality. 2. Mild atrophy and chronic microvascular ischemic changes. Electronically Signed   By: Obie DredgeWilliam T Derry M.D.   On: 09/14/2018 16:47  IMPRESSION AND PLAN:   1.  Altered mental status - CT brain negative. Likely secondary to urinary tract infection. - We will continue neurological checks every 4 hours and monitor for improvement associated with management of urinary tract infection. - Fall precautions  2.  Urinary tract infection - Treatment initiated with IV Rocephin -Urine culture pending - Normal saline infusing to peripheral IV at 75 cc/h - We will repeat CBC and BMP in the a.m.  3. hypertension - Metoprolol continued  4.  Parkinson's disease - Requip and Sinemet continued - Physical therapy consulted for supportive care  5.  Diabetes mellitus - Amaryl restarted - Sensitive sliding scale insulin  DVT and PPI prophylaxis initiated    All the records are reviewed and case discussed with ED provider. The plan of care was discussed in details with the patient (and family). I answered all questions. The patient agreed to proceed with the above mentioned plan. Further management will depend upon hospital course.   CODE STATUS: Full code  TOTAL TIME TAKING CARE OF THIS PATIENT: 45 minutes.    Milas Kocherngela H Jeferson Boozer CRNP on 09/14/2018 at 6:53 PM  Pager - [phone]  After 6pm go to [email or web address] - Social research officer, governmentpassword EPAS [facility]  [facility] [place]  Office  [phone]  CC: Primary care physician; Danella PentonMiller, Mark F, MD   Note: This  dictation was prepared with Dragon dictation along with smaller phrase technology. Any transcriptional errors that result from this process are unintentional.

## 2018-09-14 NOTE — ED Notes (Signed)
ED TO INPATIENT HANDOFF REPORT  ED Nurse Name and Phone #: [phone] [facility] RN   S Name/Age/Gender Michelle Beck 85 y.o. female Room/Bed: ED24A/ED24A  Code Status   Code Status: Full Code  Home/SNF/Other Home Patient oriented to: self and place Is this baseline? No   Triage Complete: Triage complete  Chief Complaint confusion ems  Triage Note  In via EMS with c/o confusion. EMS reports this am she was fine at 8, at 10 am she was more confused and altered.  148/66, SR 70-80's. No slurred speech or facial drooping. FSBS 138.   Brought to triage by ems.  Patient does not answer questions.  She talks at times but it does not make sense.   Allergies Allergies  Allergen Reactions  . Fentanyl Anxiety and Shortness Of Breath  . Actonel [Risedronate Sodium] Other (See Comments)    Reaction unknown  . Hydrocodone Other (See Comments)    Reaction unknown  . Iodine Other (See Comments)    Unknown  . Shellfish Allergy Other (See Comments)    Reaction unknown  . Penicillins Rash and Other (See Comments)    Has patient had a PCN reaction causing immediate rash, facial/tongue/throat swelling, SOB or lightheadedness with hypotension: Yes Has patient had a PCN reaction causing severe rash involving mucus membranes or skin necrosis: No Has patient had a PCN reaction that required hospitalization: No Has patient had a PCN reaction occurring within the last 10 years: No If all of the above answers are "NO", then may proceed with Cephalosporin use.     Level of Care/Admitting Diagnosis ED Disposition    ED Disposition Condition Comment   Admit  Hospital Area: [facility] [[id number]]  Level of Care: Med-Surg [16]  Covid Evaluation: Confirmed COVID Negative  Diagnosis: AMS (altered mental status) [[id number]]  Admitting Physician: Pearletha Alfred [[id number]]  Attending Physician: Pearletha Alfred [[id number]]  Estimated length of stay: past midnight tomorrow  Certification::  I certify this patient will need inpatient services for at least 2 midnights  PT Class (Do Not Modify): Inpatient [101]  PT Acc Code (Do Not Modify): Private [1]       B Medical/Surgery History Past Medical History:  Diagnosis Date  . Arthritis   . Asthma   . Closed fracture of three ribs   . Closed fracture of unspecified part of neck of femur   . Closed fracture of unspecified part of vertebral column without mention of spinal cord injury   . Disturbance of salivary secretion   . Dysrhythmia   . Fall   . GERD (gastroesophageal reflux disease)   . Hypertension   . Osteoporosis, unspecified   . Pain in thoracic spine   . Paralysis agitans (HCC)   . Parkinson's disease (HCC)   . Pneumonia   . Scoliosis (and kyphoscoliosis), idiopathic   . Unspecified adverse effect of unspecified drug, medicinal and biological substance   . Unspecified disorder of plasma protein metabolism    Past Surgical History:  Procedure Laterality Date  . CATARACT EXTRACTION W/PHACO Right 09/29/2017   Procedure: CATARACT EXTRACTION PHACO AND INTRAOCULAR LENS PLACEMENT (IOC);  Surgeon: Galen Manila, MD;  Location: [facility] ORS;  Service: Ophthalmology;  Laterality: Right;  [place] 00:39.3 AP% 14.9 CDE 5.85 Fluid pack lot # [id number]  . HERNIA REPAIR  2002  . HIP SURGERY       A IV Location/Drains/Wounds Patient Lines/Drains/Airways Status   Active Line/Drains/Airways    Name:   Placement date:  Placement time:   Site:   Days:   Peripheral IV 09/14/18 Left Forearm   09/14/18    1411    Forearm   less than 1   Incision (Closed) 09/29/17 Eye Right   09/29/17    1014     350          Intake/Output Last 24 hours  Intake/Output Summary (Last 24 hours) at 09/14/2018 2005 Last data filed at 09/14/2018 1943 Gross per 24 hour  Intake 600 ml  Output -  Net 600 ml    Labs/Imaging Results for orders placed or performed during the hospital encounter of 09/14/18 (from the past 48 hour(s))  Comprehensive  metabolic panel     Status: Abnormal   Collection Time: 09/14/18  2:04 PM  Result Value Ref Range   Sodium 134 (L) 135 - 145 mmol/L   Potassium 4.6 3.5 - 5.1 mmol/L    Comment: HEMOLYSIS AT THIS LEVEL MAY AFFECT RESULT   Chloride 98 98 - 111 mmol/L   CO2 28 22 - 32 mmol/L   Glucose, Bld 141 (H) 70 - 99 mg/dL   BUN 47 (H) 8 - 23 mg/dL   Creatinine, Ser 1.08 (H) 0.44 - 1.00 mg/dL   Calcium 10.2 8.9 - 10.3 mg/dL   Total Protein 7.1 6.5 - 8.1 g/dL   Albumin 3.9 3.5 - 5.0 g/dL   AST 95 (H) 15 - 41 U/L    Comment: HEMOLYSIS AT THIS LEVEL MAY AFFECT RESULT   ALT 12 0 - 44 U/L   Alkaline Phosphatase 77 38 - 126 U/L   Total Bilirubin 0.9 0.3 - 1.2 mg/dL    Comment: HEMOLYSIS AT THIS LEVEL MAY AFFECT RESULT   GFR calc non Af Amer 46 (L) >60 mL/min   GFR calc Af Amer 54 (L) >60 mL/min   Anion gap 8 5 - 15    Comment: Performed at [facility], [address] [place], [place] [address]  CBC     Status: Abnormal   Collection Time: 09/14/18  2:04 PM  Result Value Ref Range   WBC 6.2 4.0 - 10.5 K/uL   RBC 3.96 3.87 - 5.11 MIL/uL   Hemoglobin 12.0 12.0 - 15.0 g/dL   HCT 35.8 (L) 36.0 - 46.0 %   MCV 90.4 80.0 - 100.0 fL   MCH 30.3 26.0 - 34.0 pg   MCHC 33.5 30.0 - 36.0 g/dL   RDW 14.2 11.5 - 15.5 %   Platelets 83 (L) 150 - 400 K/uL    Comment: Immature Platelet Fraction may be clinically indicated, consider ordering this additional test [id number]    nRBC 0.0 0.0 - 0.2 %    Comment: Performed at [facility], [address] [place],  [address]  Urinalysis, Routine w reflex microscopic     Status: Abnormal   Collection Time: 09/14/18  2:05 PM  Result Value Ref Range   Color, Urine YELLOW (A) YELLOW   APPearance HAZY (A) CLEAR   Specific Gravity, Urine 1.012 1.005 - 1.030   pH 6.0 5.0 - 8.0   Glucose, UA NEGATIVE NEGATIVE mg/dL   Hgb urine dipstick NEGATIVE NEGATIVE   Bilirubin Urine NEGATIVE NEGATIVE   Ketones, ur NEGATIVE NEGATIVE mg/dL   Protein, ur 30  (A) NEGATIVE mg/dL   Nitrite NEGATIVE NEGATIVE   Leukocytes,Ua NEGATIVE NEGATIVE   RBC / HPF 0-5 0 - 5 RBC/hpf   WBC, UA 0-5 0 - 5 WBC/hpf   Bacteria, UA MANY (A)  NONE SEEN   Squamous Epithelial / LPF 0-5 0 - 5   Mucus PRESENT    Hyaline Casts, UA PRESENT     Comment: Performed at [facility], [address] [place], [place] [address]  Troponin I (High Sensitivity)     Status: Abnormal   Collection Time: 09/14/18  2:14 PM  Result Value Ref Range   Troponin I (High Sensitivity) 23 (H) <18 ng/L    Comment: (NOTE) Elevated high sensitivity troponin I (hsTnI) values and significant  changes across serial measurements may suggest ACS but many other  chronic and acute conditions are known to elevate hsTnI results.  Refer to the "Links" section for chest pain algorithms and additional  guidance. Performed at [facility], [address] [place], [place] [address]   SARS Coronavirus 2 (CEPHEID - Performed in [facility] hospital lab), Hosp Order     Status: None   Collection Time: 09/14/18  5:25 PM   Specimen: Nasopharyngeal Swab  Result Value Ref Range   SARS Coronavirus 2 NEGATIVE NEGATIVE    Comment: (NOTE) If result is NEGATIVE SARS-CoV-2 target nucleic acids are NOT DETECTED. The SARS-CoV-2 RNA is generally detectable in upper and lower  respiratory specimens during the acute phase of infection. The lowest  concentration of SARS-CoV-2 viral copies this assay can detect is 250  copies / mL. A negative result does not preclude SARS-CoV-2 infection  and should not be used as the sole basis for treatment or other  patient management decisions.  A negative result may occur with  improper specimen collection / handling, submission of specimen other  than nasopharyngeal swab, presence of viral mutation(s) within the  areas targeted by this assay, and inadequate number of viral copies  (<250 copies / mL). A negative result must be combined with clinical   observations, patient history, and epidemiological information. If result is POSITIVE SARS-CoV-2 target nucleic acids are DETECTED. The SARS-CoV-2 RNA is generally detectable in upper and lower  respiratory specimens dur ing the acute phase of infection.  Positive  results are indicative of active infection with SARS-CoV-2.  Clinical  correlation with patient history and other diagnostic information is  necessary to determine patient infection status.  Positive results do  not rule out bacterial infection or co-infection with other viruses. If result is PRESUMPTIVE POSTIVE SARS-CoV-2 nucleic acids MAY BE PRESENT.   A presumptive positive result was obtained on the submitted specimen  and confirmed on repeat testing.  While 2019 novel coronavirus  (SARS-CoV-2) nucleic acids may be present in the submitted sample  additional confirmatory testing may be necessary for epidemiological  and / or clinical management purposes  to differentiate between  SARS-CoV-2 and other Sarbecovirus currently known to infect humans.  If clinically indicated additional testing with an alternate test  methodology [phone]([id number]) is advised. The SARS-CoV-2 RNA is generally  detectable in upper and lower respiratory sp ecimens during the acute  phase of infection. The expected result is Negative. Fact Sheet for Patients:  BoilerBrush.com.cyhttps://www.fda.gov/media/136312/download Fact Sheet for Healthcare Providers: https://pope.com/https://www.fda.gov/media/136313/download This test is not yet approved or cleared by the [place] FDA and has been authorized for detection and/or diagnosis of SARS-CoV-2 by FDA under an Emergency Use Authorization (EUA).  This EUA will remain in effect (meaning this test can be used) for the duration of the COVID-19 declaration under Section 564(b)(1) of the Act, 21 U.S.C. section 360bbb-3(b)(1), unless the authorization is terminated or revoked sooner. Performed at [facility], [address] [place], [place] [address]    Dg Chest 1 View  Result Date: 09/14/2018 CLINICAL DATA:  Altered mental status. EXAM: CHEST  1 VIEW COMPARISON:  Chest x-ray dated Aug 16, 2010. FINDINGS: The patient is rotated to the right. The heart size and mediastinal contours are within normal limits. Atherosclerotic calcification of the aortic arch. Normal pulmonary vascularity. No focal consolidation, pleural effusion, or pneumothorax. No acute osseous abnormality. Old lower thoracic kyphoplasties. IMPRESSION: No active disease. Electronically Signed   By: Obie DredgeWilliam T Derry M.D.   On: 09/14/2018 16:27   Ct Head Wo Contrast  Result Date: 09/14/2018 CLINICAL DATA:  Altered mental status. EXAM: CT HEAD WITHOUT CONTRAST TECHNIQUE: Contiguous axial images were obtained from the base of the skull through the vertex without intravenous contrast. COMPARISON:  None. FINDINGS: Brain: No evidence of acute infarction, hemorrhage, hydrocephalus, extra-axial collection or mass lesion/mass effect. Mild generalized cerebral atrophy. Scattered mild periventricular and subcortical white matter hypodensities are nonspecific, but favored to reflect chronic microvascular ischemic changes. Vascular: Calcified atherosclerosis at the skullbase. No hyperdense vessel. Skull: Normal. Negative for fracture or focal lesion. Sinuses/Orbits: No acute finding. Large retention cyst in the left sphenoid sinus. Other: None. IMPRESSION: 1.  No acute intracranial abnormality. 2. Mild atrophy and chronic microvascular ischemic changes. Electronically Signed   By: Obie DredgeWilliam T Derry M.D.   On: 09/14/2018 16:47    Pending Labs Unresulted Labs (From admission, onward)    Start     Ordered   09/21/18 0500  Creatinine, serum  (enoxaparin (LOVENOX)    CrCl >/= 30 ml/min)  Weekly,   STAT    Comments: while on enoxaparin therapy    09/14/18 1853   09/15/18 0500  Basic metabolic panel  Tomorrow morning,   STAT     09/14/18 1853   09/15/18 0500  CBC  Tomorrow  morning,   STAT     09/14/18 1853   09/14/18 1959  Hemoglobin A1c  Once,   STAT    Comments: To assess prior glycemic control    09/14/18 1958   09/14/18 1854  Urine culture  Once,   STAT    Question:  Patient immune status  Answer:  Normal   09/14/18 1853   09/14/18 1854  CBC  (enoxaparin (LOVENOX)    CrCl >/= 30 ml/min)  Once,   STAT    Comments: Baseline for enoxaparin therapy IF NOT ALREADY DRAWN.  Notify MD if PLT < 100 K.    09/14/18 1853   09/14/18 1854  Creatinine, serum  (enoxaparin (LOVENOX)    CrCl >/= 30 ml/min)  Once,   STAT    Comments: Baseline for enoxaparin therapy IF NOT ALREADY DRAWN.    09/14/18 1853   09/14/18 1638  Troponin I (High Sensitivity)  ONCE - STAT,   STAT    Question:  Indication  Answer:  Other   09/14/18 1637          Vitals/Pain Today's Vitals   09/14/18 1927 09/14/18 1930 09/14/18 1931 09/14/18 1945  BP: (!) 174/76 (!) 169/72 (!) 161/82 (!) 170/81  Pulse:  71 73 77  Resp:  15 17 16   SpO2:  96% 97% 93%  Weight:      Height:        Isolation Precautions No active isolations  Medications Medications  sodium chloride flush (NS) 0.9 % injection 3 mL (3 mLs Intravenous Not Given 09/14/18 1705)  Carbidopa-Levodopa ER (SINEMET CR) 25-100 MG tablet controlled release 1 tablet (has  no administration in time range)  rOPINIRole (REQUIP) tablet 2 mg (has no administration in time range)  Cholecalciferol 1,000 Units (has no administration in time range)  docusate sodium (COLACE) capsule 50 mg (has no administration in time range)  glimepiride (AMARYL) tablet 1 mg (has no administration in time range)  metoprolol tartrate (LOPRESSOR) tablet 12.5 mg (12.5 mg Oral Given 09/14/18 1927)  pantoprazole (PROTONIX) EC tablet 40 mg (has no administration in time range)  0.9 %  sodium chloride infusion ( Intravenous New Bag/Given 09/14/18 1912)  acetaminophen (TYLENOL) tablet 650 mg (has no administration in time range)    Or  acetaminophen (TYLENOL)  suppository 650 mg (has no administration in time range)  polyethylene glycol (MIRALAX / GLYCOLAX) packet 17 g (has no administration in time range)  ondansetron (ZOFRAN) tablet 4 mg (has no administration in time range)    Or  ondansetron (ZOFRAN) injection 4 mg (has no administration in time range)  cefTRIAXone (ROCEPHIN) 1 g in sodium chloride 0.9 % 100 mL IVPB (0 g Intravenous Stopped 09/14/18 1943)  enoxaparin (LOVENOX) injection 40 mg (has no administration in time range)  insulin aspart (novoLOG) injection 0-9 Units (has no administration in time range)  insulin aspart (novoLOG) injection 0-5 Units (has no administration in time range)  0.9 %  sodium chloride infusion ( Intravenous Stopped 09/14/18 1813)  rOPINIRole (REQUIP) tablet 2 mg (2 mg Oral Given 09/14/18 1752)  hydrALAZINE (APRESOLINE) injection 10 mg (10 mg Intravenous Given 09/14/18 1908)    Mobility non-ambulatory High fall risk   Focused Assessments Cardiac Assessment Handoff:  Cardiac Rhythm: Sinus bradycardia No results found for: CKTOTAL, CKMB, CKMBINDEX, TROPONINI No results found for: DDIMER Does the Patient currently have chest pain? No      R Recommendations: See Admitting Provider Note  Report given to:   Additional Notes: .

## 2018-09-14 NOTE — ED Notes (Signed)
Attempted to call report. No nurse assigned. Will call back in 10 mins.

## 2018-09-14 NOTE — ED Triage Notes (Signed)
Brought to triage by ems.  Patient does not answer questions.  She talks at times but it does not make sense.

## 2018-09-14 NOTE — TOC Progression Note (Signed)
Transition of Care [facility]) - Progression Note    Patient Details  Name: [name] MRN: [id number] Date of Birth: Sep 16, 1929  Transition of Care [facility]) CM/SW Contact  Marshell Garfinkel, RN Phone Number: 09/14/2018, 2:23 PM  Clinical Narrative:     ED RNCM notified by [facility] that this patient was closed out with [facility] in May, 2020.        Expected Discharge Plan and Services                                                 Social Determinants of Health (SDOH) Interventions    Readmission Risk Interventions No flowsheet data found.

## 2018-09-14 NOTE — ED Provider Notes (Addendum)
[facility] Emergency Department Provider Note       Time seen: ----------------------------------------- 3:44 PM on 09/14/2018 ----------------------------------------- Level V caveat: History/ROS limited by altered mental status  I have reviewed the triage vital signs and the nursing notes.  HISTORY   Chief Complaint Altered Mental Status    HPI Michelle Beck is a 85 y.o. female with a history of arthritis, asthma, GERD, hypertension, Parkinson's disease, pneumonia who presents to the ED for confusion.  She arrives by EMS with increased confusion.  No further information is available at this time.  Past Medical History:  Diagnosis Date  . Arthritis   . Asthma   . Closed fracture of three ribs   . Closed fracture of unspecified part of neck of femur   . Closed fracture of unspecified part of vertebral column without mention of spinal cord injury   . Disturbance of salivary secretion   . Dysrhythmia   . Fall   . GERD (gastroesophageal reflux disease)   . Hypertension   . Osteoporosis, unspecified   . Pain in thoracic spine   . Paralysis agitans ([place])   . Parkinson's disease ([place])   . Pneumonia   . Scoliosis (and kyphoscoliosis), idiopathic   . Unspecified adverse effect of unspecified drug, medicinal and biological substance   . Unspecified disorder of plasma protein metabolism     Patient Active Problem List   Diagnosis Date Noted  . Parkinson's disease ([place]) 08/24/2012    Past Surgical History:  Procedure Laterality Date  . CATARACT EXTRACTION W/PHACO Right 09/29/2017   Procedure: CATARACT EXTRACTION PHACO AND INTRAOCULAR LENS PLACEMENT (IOC);  Surgeon: Birder Robson, MD;  Location: [facility] ORS;  Service: Ophthalmology;  Laterality: Right;  [place] 00:39.3 AP% 14.9 CDE 5.85 Fluid pack lot # [id number]  . HERNIA REPAIR  2002  . HIP SURGERY      Allergies Fentanyl, Actonel [risedronate sodium], Hydrocodone, Iodine, Shellfish allergy, and  Penicillins  Social History Social History   Tobacco Use  . Smoking status: Never Smoker  . Smokeless tobacco: Never Used  Substance Use Topics  . Alcohol use: No    Alcohol/week: 0.0 standard drinks  . Drug use: No   Review of Systems Unknown, patient complains of stomach upset but is otherwise disoriented  All systems negative/normal/unremarkable except as stated in the HPI  ____________________________________________   PHYSICAL EXAM:  VITAL SIGNS: ED Triage Vitals [09/14/18 1400]  Enc Vitals Group     BP (!) 160/59     Pulse Rate (!) 55     Resp 16     Temp      Temp src      SpO2 96 %     Weight      Height      Head Circumference      Peak Flow      Pain Score      Pain Loc      Pain Edu?      Excl. in [place]?    Constitutional: Alert but disoriented.  No distress Eyes: Conjunctivae are normal. Normal extraocular movements. ENT      Head: Normocephalic and atraumatic.      Nose: No congestion/rhinnorhea.      Mouth/Throat: Mucous membranes are moist.      Neck: No stridor. Cardiovascular: Normal rate, regular rhythm. No murmurs, rubs, or gallops. Respiratory: Normal respiratory effort without tachypnea nor retractions. Breath sounds are clear and equal bilaterally. No wheezes/rales/rhonchi. Gastrointestinal: Soft and nontender. Normal bowel  sounds Musculoskeletal: Nontender with normal range of motion in extremities. No lower extremity tenderness nor edema. Neurologic: Flat affect, weakness, difficulty walking, shuffling gait, confusion Skin:  Skin is warm, dry and intact. No rash noted. Psychiatric: Slow speech, flat affect ____________________________________________  ED COURSE:  As part of my medical decision making, I reviewed the following data within the electronic MEDICAL RECORD NUMBER History obtained from family if available, nursing notes, old chart and ekg, as well as notes from prior ED visits. Patient presented for confusion, we will assess with  labs and imaging as indicated at this time.   Procedures  Michelle Beck was evaluated in Emergency Department on 09/14/2018 for the symptoms described in the history of present illness. She was evaluated in the context of the global COVID-19 pandemic, which necessitated consideration that the patient might be at risk for infection with the SARS-CoV-2 virus that causes COVID-19. Institutional protocols and algorithms that pertain to the evaluation of patients at risk for COVID-19 are in a state of rapid change based on information released by regulatory bodies including the CDC and federal and state organizations. These policies and algorithms were followed during the patient's care in the ED.  ____________________________________________   LABS (pertinent positives/negatives)  Labs Reviewed  COMPREHENSIVE METABOLIC PANEL - Abnormal; Notable for the following components:      Result Value   Sodium 134 (*)    Glucose, Bld 141 (*)    BUN 47 (*)    Creatinine, Ser 1.08 (*)    AST 95 (*)    GFR calc non Af Amer 46 (*)    GFR calc Af Amer 54 (*)    All other components within normal limits  CBC - Abnormal; Notable for the following components:   HCT 35.8 (*)    Platelets 83 (*)    All other components within normal limits  URINALYSIS, ROUTINE W REFLEX MICROSCOPIC - Abnormal; Notable for the following components:   Color, Urine YELLOW (*)    APPearance HAZY (*)    Protein, ur 30 (*)    Bacteria, UA MANY (*)    All other components within normal limits  TROPONIN I (HIGH SENSITIVITY) - Abnormal; Notable for the following components:   Troponin I (High Sensitivity) 23 (*)    All other components within normal limits  SARS CORONAVIRUS 2 (HOSPITAL ORDER, PERFORMED IN [facility])  TROPONIN I (HIGH SENSITIVITY)   EKG: Interpreted by me, sinus rhythm the rate of 65 bpm, prolonged PR interval, normal QRS, normal QT RADIOLOGY Images were viewed by me  CT head, chest  x-ray IMPRESSION: 1.  No acute intracranial abnormality. 2. Mild atrophy and chronic microvascular ischemic changes. IMPRESSION: No active disease. ____________________________________________   DIFFERENTIAL DIAGNOSIS   Parkinson's disease, dementia, dehydration, electrolyte abnormality, occult infection, CVA, medication side effect  FINAL ASSESSMENT AND PLAN  Confusion, altered mental status   Plan: The patient had presented for confusion. Patient's labs did reveal an elevated BUN relative to creatinine but otherwise test are unremarkable.  Troponin slightly elevated of uncertain significance. Patient's imaging has not revealed any acute process.  She still remains very altered and lethargic.  Family states this is a significant change from her baseline.  I will discuss with the hospitalist for admission.   Ulice DashJohnathan E Wildon Cuevas, MD    Note: This note was generated in part or whole with voice recognition software. Voice recognition is usually quite accurate but there are transcription errors that can and very often  do occur. I apologize for any typographical errors that were not detected and corrected.     Emily FilbertWilliams, Jhordan Kinter E, MD 09/14/18 1742    Emily FilbertWilliams, Octavius Shin E, MD 09/14/18 [id number]

## 2018-09-14 NOTE — ED Notes (Signed)
Patient

## 2018-09-14 NOTE — ED Triage Notes (Signed)
In via EMS with c/o confusion. EMS reports this am she was fine at 8, at 10 am she was more confused and altered.  148/66, SR 70-80's. No slurred speech or facial drooping. FSBS 138.

## 2018-09-15 ENCOUNTER — Inpatient Hospital Stay: Payer: Medicare Other

## 2018-09-15 ENCOUNTER — Encounter: Payer: Self-pay | Admitting: Primary Care

## 2018-09-15 DIAGNOSIS — R4182 Altered mental status, unspecified: Secondary | ICD-10-CM

## 2018-09-15 DIAGNOSIS — Z515 Encounter for palliative care: Secondary | ICD-10-CM

## 2018-09-15 DIAGNOSIS — Z7189 Other specified counseling: Secondary | ICD-10-CM

## 2018-09-15 LAB — CBC
HCT: 35.4 % — ABNORMAL LOW (ref 36.0–46.0)
Hemoglobin: 11.9 g/dL — ABNORMAL LOW (ref 12.0–15.0)
MCH: 29.9 pg (ref 26.0–34.0)
MCHC: 33.6 g/dL (ref 30.0–36.0)
MCV: 88.9 fL (ref 80.0–100.0)
Platelets: 80 10*3/uL — ABNORMAL LOW (ref 150–400)
RBC: 3.98 MIL/uL (ref 3.87–5.11)
RDW: 14 % (ref 11.5–15.5)
WBC: 5.7 10*3/uL (ref 4.0–10.5)
nRBC: 0 % (ref 0.0–0.2)

## 2018-09-15 LAB — GLUCOSE, CAPILLARY
Glucose-Capillary: 83 mg/dL (ref 70–99)
Glucose-Capillary: 149 mg/dL — ABNORMAL HIGH (ref 70–99)
Glucose-Capillary: 81 mg/dL (ref 70–99)
Glucose-Capillary: 173 mg/dL — ABNORMAL HIGH (ref 70–99)

## 2018-09-15 LAB — BASIC METABOLIC PANEL
Anion gap: 10 (ref 5–15)
BUN: 37 mg/dL — ABNORMAL HIGH (ref 8–23)
CO2: 30 mmol/L (ref 22–32)
Calcium: 9.3 mg/dL (ref 8.9–10.3)
Chloride: 98 mmol/L (ref 98–111)
Creatinine, Ser: 0.85 mg/dL (ref 0.44–1.00)
GFR calc Af Amer: >60 mL/min (ref >60)
GFR calc non Af Amer: >60 mL/min (ref >60)
Glucose, Bld: 144 mg/dL — ABNORMAL HIGH (ref 70–99)
Potassium: 3.8 mmol/L (ref 3.5–5.1)
Sodium: 138 mmol/L (ref 135–145)

## 2018-09-15 LAB — LACTIC ACID, PLASMA: Lactic Acid, Venous: 1.5 mmol/L (ref 0.5–1.9)

## 2018-09-15 LAB — URINE CULTURE: Culture: NO GROWTH

## 2018-09-15 LAB — T4, FREE: Free T4: 1.1 ng/dL (ref 0.61–1.12)

## 2018-09-15 LAB — TSH: TSH: 5.674 u[IU]/mL — ABNORMAL HIGH (ref 0.350–4.500)

## 2018-09-15 LAB — VITAMIN B12: Vitamin B-12: 350 pg/mL (ref 180–914)

## 2018-09-15 MED ORDER — ORAL CARE MOUTH RINSE
15.0000 mL | Freq: Two times a day (BID) | OROMUCOSAL | Status: DC
  Administered 2018-09-15 – 2018-09-16 (×2): 15 mL via OROMUCOSAL

## 2018-09-15 NOTE — Progress Notes (Signed)
Patient admitted with low temperature, 88.7 rectal temperature, MD notified, Warming blanket applied, temperature improved with warming blanket, will continue to monitor.

## 2018-09-15 NOTE — Progress Notes (Signed)
[place] [place] NAME: Michelle Beck    MR#:  [id number]  [place]:  June 29, 1929  SUBJECTIVE:   Patient with confusion this am   REVIEW OF SYSTEMS:    Unable to obtain confused     DRUG ALLERGIES:   Allergies  Allergen Reactions  . Fentanyl Anxiety and Shortness Of Breath  . Actonel [Risedronate Sodium] Other (See Comments)    Reaction unknown  . Hydrocodone Other (See Comments)    Reaction unknown  . Iodine Other (See Comments)    Unknown  . Shellfish Allergy Other (See Comments)    Reaction unknown  . Penicillins Rash and Other (See Comments)    Has patient had a PCN reaction causing immediate rash, facial/tongue/throat swelling, SOB or lightheadedness with hypotension: Yes Has patient had a PCN reaction causing severe rash involving mucus membranes or skin necrosis: No Has patient had a PCN reaction that required hospitalization: No Has patient had a PCN reaction occurring within the last 10 years: No If all of the above answers are "NO", then may proceed with Cephalosporin use.     VITALS:  Blood pressure (!) 141/75, pulse 99, temperature 97.6 F (36.4 C), temperature source Oral, resp. rate 16, height 5\' 4"  (1.626 m), weight 79.4 kg, SpO2 94 %.  PHYSICAL EXAMINATION:  Constitutional: Appears well-developed and well-nourished. No distress. HENT: Normocephalic. Marland Kitchen Oropharynx is clear and moist.  Eyes: Conjunctivae and EOM are normal. PERRLA, no scleral icterus.  Neck: Normal ROM. Neck supple. No JVD. No tracheal deviation. CVS: RRR, S1/S2 +, no murmurs, no gallops, no carotid bruit.  Pulmonary: Effort and breath sounds normal, no stridor, rhonchi, wheezes, rales.  Abdominal: Soft. BS +,  no distension, tenderness, rebound or guarding.  Musculoskeletal: Normal range of motion. No edema and no tenderness.  Neuro: Alert. No obviouso focal deficits.does not follow commands well Skin: Skin is warm and dry. No rash  noted. Psychiatric:confused     LABORATORY PANEL:   CBC Recent Labs  Lab 09/15/18 0103  WBC 5.7  HGB 11.9*  HCT 35.4*  PLT 80*   ------------------------------------------------------------------------------------------------------------------  Chemistries  Recent Labs  Lab 09/14/18 1404 09/15/18 0103  NA 134* 138  K 4.6 3.8  CL 98 98  CO2 28 30  GLUCOSE 141* 144*  BUN 47* 37*  CREATININE 1.08* 0.85  CALCIUM 10.2 9.3  AST 95*  --   ALT 12  --   ALKPHOS 77  --   BILITOT 0.9  --    ------------------------------------------------------------------------------------------------------------------  Cardiac Enzymes No results for input(s): TROPONINI in the last 168 hours. ------------------------------------------------------------------------------------------------------------------  RADIOLOGY:  Ct Abdomen Pelvis Wo Contrast  Result Date: 09/14/2018 CLINICAL DATA:  Abdominal distension. Right lower quadrant abdominal pain. EXAM: CT ABDOMEN AND PELVIS WITHOUT CONTRAST TECHNIQUE: Multidetector CT imaging of the abdomen and pelvis was performed following the standard protocol without IV contrast. COMPARISON:  None. FINDINGS: Lower chest: The heart is enlarged. There are trace bilateral pleural effusions with presumed adjacent atelectasis. Hepatobiliary: No focal liver abnormality is seen. No gallstones, gallbladder wall thickening, or biliary dilatation. Pancreas: Unremarkable. No pancreatic ductal dilatation or surrounding inflammatory changes. Spleen: Normal in size without focal abnormality. Adrenals/Urinary Tract: Adrenal glands are unremarkable. Kidneys are normal, without renal calculi, focal lesion, or hydronephrosis. The urinary bladder is significantly distended. Stomach/Bowel: There is a metallic object terminating near the patient's anus. This may represent a temperature probe. There is sigmoid diverticulosis without CT evidence of diverticulitis. There is  a normal  appendix in the right lower quadrant. Vascular/Lymphatic: Aortic atherosclerosis. No enlarged abdominal or pelvic lymph nodes. Reproductive: Uterus and bilateral adnexa are unremarkable. Other: No abdominal wall hernia or abnormality. No abdominopelvic ascites. Musculoskeletal: Patient is status post total hip arthroplasty on the right. There are multiple old compression fractures throughout the thoracolumbar spine. The patient is status post prior multilevel vertebral augmentation of the L2, T11, and T12 vertebral bodies. Multilevel degenerative changes are noted. IMPRESSION: 1. No acute intra-abdominal abnormality. There is a normal appendix in the right lower quadrant. 2. Trace bilateral pleural effusions with adjacent atelectasis. 3. Significantly distended urinary bladder. 4. Scattered colonic diverticula without CT evidence of diverticulitis. Electronically Signed   By: Katherine Mantlehristopher  Green M.D.   On: 09/14/2018 23:18   Dg Chest 1 View  Result Date: 09/14/2018 CLINICAL DATA:  Altered mental status. EXAM: CHEST  1 VIEW COMPARISON:  Chest x-ray dated Aug 16, 2010. FINDINGS: The patient is rotated to the right. The heart size and mediastinal contours are within normal limits. Atherosclerotic calcification of the aortic arch. Normal pulmonary vascularity. No focal consolidation, pleural effusion, or pneumothorax. No acute osseous abnormality. Old lower thoracic kyphoplasties. IMPRESSION: No active disease. Electronically Signed   By: Obie DredgeWilliam T Derry M.D.   On: 09/14/2018 16:27   Ct Head Wo Contrast  Result Date: 09/14/2018 CLINICAL DATA:  Altered mental status. EXAM: CT HEAD WITHOUT CONTRAST TECHNIQUE: Contiguous axial images were obtained from the base of the skull through the vertex without intravenous contrast. COMPARISON:  None. FINDINGS: Brain: No evidence of acute infarction, hemorrhage, hydrocephalus, extra-axial collection or mass lesion/mass effect. Mild generalized cerebral atrophy. Scattered mild  periventricular and subcortical white matter hypodensities are nonspecific, but favored to reflect chronic microvascular ischemic changes. Vascular: Calcified atherosclerosis at the skullbase. No hyperdense vessel. Skull: Normal. Negative for fracture or focal lesion. Sinuses/Orbits: No acute finding. Large retention cyst in the left sphenoid sinus. Other: None. IMPRESSION: 1.  No acute intracranial abnormality. 2. Mild atrophy and chronic microvascular ischemic changes. Electronically Signed   By: Obie DredgeWilliam T Derry M.D.   On: 09/14/2018 16:47     ASSESSMENT AND PLAN:   85 year old female with history of Parkinson's disease who is brought into the emergency room due to altered mental status.  1.  Acute metabolic encephalopathy felt to be due to UTI.  UA is not completely convincing for UTI.  Will order MRI brain to evaluate for ongoing encephalopathy.  Check TSH, B1, B12 and RPR level.   2.  UTI: Continue Rocephin and follow-up on final urine culture  3.  Parkinson's disease: Continue Sinemet  4.  Continue ADA diet, sliding scale and Amaryl.  5.  Essential hypertension: Continue metoprolol   Will call family later this evening   PC consult for code and GOC  CODE STATUS: FULL  TOTAL TIME TAKING CARE OF THIS PATIENT: 30 minutes.     POSSIBLE D/C 1-2 days, DEPENDING ON CLINICAL CONDITION.   Adrian SaranSital Stephanye Finnicum M.D on 09/15/2018 at 10:50 AM  Between 7am to 6pm - Pager - [phone] After 6pm go to [email or web address] - password [email or web address]  [facility]  Office  [phone]  CC: Primary care physician; Danella PentonMiller, Mark F, MD  Note: This dictation was prepared with Dragon dictation along with smaller phrase technology. Any transcriptional errors that result from this process are unintentional.

## 2018-09-15 NOTE — Consult Note (Signed)
Consultation Note Date: 09/15/2018   Patient Name: Michelle Beck  DOB: 1929-08-22  MRN: [id number]  Age / Sex: 85 y.o., female  PCP: Danella PentonMiller, Michelle F, MD Referring Physician: Adrian SaranMody, Sital, MD  Reason for Consultation: Establishing goals of care  HPI/Patient Profile: 85 y.o. female  with past medical history of Parkinson's disease, hypertension, asthma, history of pneumonia, osteoporosis, hip fracture, GERD admitted on 09/14/2018 with metabolic encephalopathy cause uncertain, possibly UTI.  PMT consulted for goals of care, CODE STATUS discussion.   Clinical Assessment and Goals of Care: Michelle Beck is resting quietly in bed.  She wakes easily making and keeping eye contact.  She is oriented to person and place, I do not ask about time.  When I ask why she is hospitalized she tells me she is not sure.  She agrees for me to call her husband.  There is no family at bedside at this time due to visitor restrictions.  I ask if she has a livign will, would she want life support, and she clearly tells me no.   Call to husband, Michelle Beck at [phone], left voicemail message.  Called to mobile number at [phone]. I update him on Michelle Beck's condition, 24-48 hours for outcomes.  I share that there is no acute process for her MRI.  We talked about CODE STATUS, he states they have a living will, DNR.  Orders made.  I share that fortunately [name] is not critically ill at this point. ENT to follow-up 6/25.  HCPOA    NEXT OF KIN -spouse, Michelle Beck.  Son Michelle Beck, daughter Michelle Beck.   SUMMARY OF RECOMMENDATIONS   Continue to treat the treatable but no CPR, no intubation.  Code Status/Advance Care Planning:  DNR  Symptom Management:   Per hospitalist, no additional needs at this time.  Palliative Prophylaxis:   Frequent Pain Assessment and Turn Reposition  Additional Recommendations (Limitations,  Scope, Preferences):  Continue to treat the treatable but no CPR, no intubation  Psycho-social/Spiritual:   Desire for further Chaplaincy support:no  Additional Recommendations: Caregiving  Support/Resources and Education on Hospice  Prognosis:   Unable to determine, based on outcomes.  One year or more would not be surprising.  Discharge Planning: To be determined, based on outcomes.  Home with home health would not be surprising      Primary Diagnoses: Present on Admission: . AMS (altered mental status)   I have reviewed the medical record, interviewed the patient and family, and examined the patient. The following aspects are pertinent.  Past Medical History:  Diagnosis Date  . Arthritis   . Asthma   . Closed fracture of three ribs   . Closed fracture of unspecified part of neck of femur   . Closed fracture of unspecified part of vertebral column without mention of spinal cord injury   . Disturbance of salivary secretion   . Dysrhythmia   . Fall   . GERD (gastroesophageal reflux disease)   . Hypertension   .  Osteoporosis, unspecified   . Pain in thoracic spine   . Paralysis agitans (HCC)   . Parkinson's disease (HCC)   . Pneumonia   . Scoliosis (and kyphoscoliosis), idiopathic   . Unspecified adverse effect of unspecified drug, medicinal and biological substance   . Unspecified disorder of plasma protein metabolism    Social History   Socioeconomic History  . Marital status: Married    Spouse name: Not on file  . Number of children: Not on file  . Years of education: Not on file  . Highest education level: Not on file  Occupational History    Comment: retired  Engineer, productionocial Needs  . Financial resource strain: Not on file  . Food insecurity    Worry: Not on file    Inability: Not on file  . Transportation needs    Medical: Not on file    Non-medical: Not on file  Tobacco Use  . Smoking status: Never Smoker  . Smokeless tobacco: Never Used  Substance and  Sexual Activity  . Alcohol use: No    Alcohol/week: 0.0 standard drinks  . Drug use: No  . Sexual activity: Not on file  Lifestyle  . Physical activity    Days per week: Not on file    Minutes per session: Not on file  . Stress: Not on file  Relationships  . Social Musicianconnections    Talks on phone: Not on file    Gets together: Not on file    Attends religious service: Not on file    Active member of club or organization: Not on file    Attends meetings of clubs or organizations: Not on file    Relationship status: Not on file  Other Topics Concern  . Not on file  Social History Narrative   Lives at home w/ her husband   Family History  Problem Relation Age of Onset  . Heart failure Mother   . Heart disease Mother   . Emphysema Father   . COPD Father   . Diabetes Sister    Scheduled Meds: . Carbidopa-Levodopa ER  1 tablet Oral QID  . cholecalciferol  1,000 Units Oral Daily  . docusate sodium  100 mg Oral Daily  . enoxaparin (LOVENOX) injection  40 mg Subcutaneous Q24H  . glimepiride  1 mg Oral Q breakfast  . insulin aspart  0-5 Units Subcutaneous QHS  . insulin aspart  0-9 Units Subcutaneous TID WC  . mouth rinse  15 mL Mouth Rinse BID  . metoprolol tartrate  12.5 mg Oral Daily  . pantoprazole  40 mg Oral Daily  . rOPINIRole  2 mg Oral TID  . sodium chloride flush  3 mL Intravenous Once   Continuous Infusions: . sodium chloride 75 mL/hr at 09/15/18 1117  . cefTRIAXone (ROCEPHIN)  IV Stopped (09/14/18 1943)   PRN Meds:.acetaminophen **OR** acetaminophen, ondansetron **OR** ondansetron (ZOFRAN) IV, polyethylene glycol Medications Prior to Admission:  Prior to Admission medications   Medication Sig Start Date End Date Taking? Authorizing Provider  Carbidopa-Levodopa ER (SINEMET CR) 25-100 MG tablet controlled release Take 1 tablet by mouth 4 (four) times daily. 9:30 AM, 1:30 PM, 5:30 PM, 9:30 PM 03/02/18  Yes Huston FoleyAthar, Saima, MD  Cholecalciferol 1000 units capsule Take  1,000 Units by mouth daily.    Yes [provider]  denosumab (PROLIA) 60 MG/ML SOLN injection Inject 60 mg into the skin every 6 (six) months. Administer in upper arm, thigh, or abdomen   Yes [provider]  docusate sodium (COLACE) 50 MG capsule Take 50 mg by mouth daily.    Yes [provider]  glimepiride (AMARYL) 1 MG tablet Take 1 mg by mouth daily with breakfast. Take if blood glucose >120   Yes [provider]  ibuprofen (ADVIL,MOTRIN) 200 MG tablet Take 1 tablet (200 mg total) by mouth every 8 (eight) hours as needed for moderate pain (with food). 12/16/14  Yes Eula Listen, MD  metoprolol tartrate (LOPRESSOR) 25 MG tablet Take 12.5 mg by mouth daily.  07/19/12  Yes [provider]  Polyvinyl Alcohol-Povidone (REFRESH OP) Place 1 drop into both eyes 2 (two) times daily as needed (for dry eyes).   Yes [provider]  rOPINIRole (REQUIP) 2 MG tablet Take 1 tablet (2 mg total) by mouth 3 (three) times daily. 03/22/18  Yes Star Age, MD   Allergies  Allergen Reactions  . Fentanyl Anxiety and Shortness Of Breath  . Actonel [Risedronate Sodium] Other (See Comments)    Reaction unknown  . Hydrocodone Other (See Comments)    Reaction unknown  . Iodine Other (See Comments)    Unknown  . Shellfish Allergy Other (See Comments)    Reaction unknown  . Penicillins Rash and Other (See Comments)    Has patient had a PCN reaction causing immediate rash, facial/tongue/throat swelling, SOB or lightheadedness with hypotension: Yes Has patient had a PCN reaction causing severe rash involving mucus membranes or skin necrosis: No Has patient had a PCN reaction that required hospitalization: No Has patient had a PCN reaction occurring within the last 10 years: No If all of the above answers are "NO", then may proceed with Cephalosporin use.    Review of Systems  Unable to perform ROS: Acuity of condition    Physical Exam Vitals signs  and nursing note reviewed.  Constitutional:      General: She is not in acute distress.    Comments: Appears chronically ill and frail  Cardiovascular:     Rate and Rhythm: Normal rate.  Pulmonary:     Effort: Pulmonary effort is normal. No respiratory distress.  Abdominal:     General: Abdomen is flat. There is no distension.  Musculoskeletal:        General: No swelling.  Skin:    General: Skin is warm and dry.  Neurological:     General: No focal deficit present.     Mental Status: She is alert.  Psychiatric:     Comments: Calm and cooperative, not fearful      Vital Signs: BP (!) 141/75 (BP Location: Right Arm)   Pulse 99   Temp 97.6 F (36.4 C) (Oral)   Resp 16   Ht 5\' 4"  (1.626 m)   Wt 79.4 kg   SpO2 94%   BMI 30.04 kg/m  Pain Scale: PAINAD       SpO2: SpO2: 94 % O2 Device:SpO2: 94 % O2 Flow Rate: .   IO: Intake/output summary:   Intake/Output Summary (Last 24 hours) at 09/15/2018 1501 Last data filed at 09/15/2018 0300 Gross per 24 hour  Intake 1195 ml  Output 700 ml  Net 495 ml    LBM:   Baseline Weight: Weight: 79.4 kg Most recent weight: Weight: 79.4 kg     Palliative Assessment/Data:   Flowsheet Rows     Most Recent Value  Intake Tab  Referral Department  Hospitalist  Unit at Time of Referral  Med/Surg Unit  Palliative Care Primary Diagnosis  Neurology  Date Notified  09/15/18  Palliative Care Type  New Palliative care  Reason for referral  Clarify Goals of Care, Advance Care Planning  Date of Admission  09/14/18  Date first seen by Palliative Care  09/15/18  # of days Palliative referral response time  0 Day(s)  # of days IP prior to Palliative referral  1  Clinical Assessment  Palliative Performance Scale Score  40%  Pain Max last 24 hours  Not able to report  Pain Min Last 24 hours  Not able to report  Dyspnea Max Last 24 Hours  Not able to report  Dyspnea Min Last 24 hours  Not able to report  Psychosocial & Spiritual Assessment   Palliative Care Outcomes      Time In: 1445 Time Out: 1520 Time Total: 35 minutes  Greater than 50%  of this time was spent counseling and coordinating care related to the above assessment and plan.  Signed by: Katheran Aweasha A Dove, NP   Please contact Palliative Medicine Team phone at [phone] for questions and concerns.  For individual provider: See Loretha StaplerAmion

## 2018-09-15 NOTE — Progress Notes (Signed)
PT Cancellation Note  Patient Details Name: SHAQUILA SIGMAN MRN: [id number] DOB: 1929-10-12   Cancelled Treatment:    Reason Eval/Treat Not Completed: Other (comment). Nsg was not able to get a temperature and was working on warming her up with heated blankets to retake her vitals; patient was not available.    [address] [place] , [place] DPT 09/15/2018, 5:11 PM

## 2018-09-16 LAB — GLUCOSE, CAPILLARY
Glucose-Capillary: 79 mg/dL (ref 70–99)
Glucose-Capillary: 139 mg/dL — ABNORMAL HIGH (ref 70–99)
Glucose-Capillary: 45 mg/dL — ABNORMAL LOW (ref 70–99)

## 2018-09-16 LAB — RPR: RPR Ser Ql: NONREACTIVE

## 2018-09-16 MED ORDER — RISPERIDONE 1 MG PO TABS: 1.0000 mg | ORAL_TABLET | Freq: Every evening | ORAL | 2 refills | Status: AC | PRN

## 2018-09-16 MED ORDER — DEXTROSE 50 % IV SOLN: 12.5000 g | Freq: Once | INTRAVENOUS | Status: DC

## 2018-09-16 NOTE — Plan of Care (Signed)
  Problem: Urinary Elimination: Goal: Signs and symptoms of infection will decrease Outcome: Progressing   Problem: Education: Goal: Knowledge of General Education information will improve Description: Including pain rating scale, medication(s)/side effects and non-pharmacologic comfort measures Outcome: Progressing   Problem: Health Behavior/Discharge Planning: Goal: Ability to manage health-related needs will improve Outcome: Progressing   Problem: Clinical Measurements: Goal: Ability to maintain clinical measurements within normal limits will improve Outcome: Progressing Goal: Will remain free from infection Outcome: Progressing Goal: Diagnostic test results will improve Outcome: Progressing Goal: Respiratory complications will improve Outcome: Progressing Goal: Cardiovascular complication will be avoided Outcome: Progressing   Problem: Activity: Goal: Risk for activity intolerance will decrease Outcome: Progressing   Problem: Elimination: Goal: Will not experience complications related to bowel motility Outcome: Progressing Goal: Will not experience complications related to urinary retention Outcome: Progressing   Problem: Pain Managment: Goal: General experience of comfort will improve Outcome: Progressing   Problem: Safety: Goal: Ability to remain free from injury will improve Outcome: Progressing   Problem: Skin Integrity: Goal: Risk for impaired skin integrity will decrease Outcome: Progressing

## 2018-09-16 NOTE — Progress Notes (Signed)
Ems here to transport pt home.  dtr called and  She was informed pt was discharging home. pts dtr was going to call her dad  Mr Macgregor and  Let him know.

## 2018-09-16 NOTE — Progress Notes (Signed)
Pt voided on own.  Ready for d/c home. Ems called for transport.  Will notify spouse when ems arrives.

## 2018-09-16 NOTE — Evaluation (Signed)
Physical Therapy Evaluation Patient Details Name: Michelle Beck MRN: [id number] DOB: [birth year] Today's Date: 09/16/2018   History of Present Illness  85 y.o. female with a known history of Parkinson's disease, hypertension, asthma, arthritis, GERD.  She arrived to the emergency room via EMS services for altered mental status.   Clinical Impression  Pt was able to answer most orientation questions appropriately (or at least reasonably close) yet consistently had confused and off topic comments t/o the session - also unable to answer questions about PLOF, home, etc (multiple times reporting that we should ask family members to answer questions that were just in the other room...)  Unsure of what pt's actual baseline looks like, but she was functionally very limited today needing heavy assist with transitions to/from EOB and unable to stand w/o direct assist to keep from falling back even with heavy cuing and encouragement.  Pt is apparently going home with family and they do not want HHPT, though it is unlikely she is even close to her baseline at home and she would benefit from further PT f/u, suggesting STR but HHPT would be a good idea at this time if she is going home.    Follow Up Recommendations SNF;Supervision/Assistance - 24 hour    Equipment Recommendations  Rolling walker with 5" wheels    Recommendations for Other Services       Precautions / Restrictions Precautions Precautions: Fall Restrictions Weight Bearing Restrictions: No      Mobility  Bed Mobility Overal bed mobility: Independent;Needs Assistance Bed Mobility: Supine to Sit;Sit to Supine     Supine to sit: Mod assist Sit to supine: Mod assist   General bed mobility comments: Pt insistent that she can transition on her own but even given extra time and low grade effort she did not initiate movement well and made very little actual effort to get to/from EOB  Transfers Overall transfer level: Needs  assistance Equipment used: Rolling walker (2 wheeled) Transfers: Sit to/from Stand Sit to Stand: Mod assist;Max assist         General transfer comment: Pt needed heavy assist to initiate rising to standing and then was leaning back constantly needing constant assist to keep from falling back.  Even with a lot of cuing and extra explanation she was unable to get weight forward on the walker to maintain static standing  Ambulation/Gait             General Gait Details: Unable/unsafe to try at this time  Stairs            Wheelchair Mobility    Modified Rankin (Stroke Patients Only)       Balance Overall balance assessment: Needs assistance Sitting-balance support: Bilateral upper extremity supported Sitting balance-Leahy Scale: Fair     Standing balance support: Bilateral upper extremity supported Standing balance-Leahy Scale: Poor Standing balance comment: Pt unable to keep weight forward enough to maintain standing w/o posterior assist                             Pertinent Vitals/Pain Pain Assessment: No/denies pain    Home Living Family/patient expects to be discharged to:: Unsure Living Arrangements: Spouse/significant other               Additional Comments: Pt apparently has w/c, walker, daughter comes and helps with some self-care ADLs    Prior Function           Comments: is  not able to tell me how mobile she was but reports she "all I do is dusting"     Hand Dominance        Extremity/Trunk Assessment   Upper Extremity Assessment Upper Extremity Assessment: Generalized weakness    Lower Extremity Assessment Lower Extremity Assessment: Generalized weakness       Communication   Communication: No difficulties  Cognition Arousal/Alertness: Awake/alert Behavior During Therapy: Restless Overall Cognitive Status: Difficult to assess                                 General Comments: unsure of  baseline, however pt with consistent low grade confusion t/o the session      General Comments      Exercises     Assessment/Plan    PT Assessment Patient needs continued PT services  PT Problem List Decreased strength;Decreased range of motion;Decreased activity tolerance;Decreased balance;Decreased mobility;Decreased cognition;Decreased knowledge of use of DME;Decreased safety awareness       PT Treatment Interventions Gait training;Functional mobility training;Therapeutic activities;Therapeutic exercise;Balance training;Cognitive remediation;Neuromuscular re-education;Patient/family education    PT Goals (Current goals can be found in the Care Plan section)  Acute Rehab PT Goals Patient Stated Goal: Go home PT Goal Formulation: Patient unable to participate in goal setting Time For Goal Achievement: 09/30/18 Potential to Achieve Goals: Fair    Frequency Min 2X/week   Barriers to discharge        Co-evaluation               AM-PAC PT "6 Clicks" Mobility  Outcome Measure Help needed turning from your back to your side while in a flat bed without using bedrails?: A Little Help needed moving from lying on your back to sitting on the side of a flat bed without using bedrails?: A Lot Help needed moving to and from a bed to a chair (including a wheelchair)?: A Lot Help needed standing up from a chair using your arms (e.g., wheelchair or bedside chair)?: A Lot Help needed to walk in hospital room?: Total Help needed climbing 3-5 steps with a railing? : Total 6 Click Score: 11    End of Session Equipment Utilized During Treatment: Gait belt Activity Tolerance: Patient limited by fatigue Patient left: with bed alarm set;with call bell/phone within reach Nurse Communication: Mobility status PT Visit Diagnosis: Muscle weakness (generalized) (M62.81);Difficulty in walking, not elsewhere classified (R26.2)    Time: [id number] PT Time Calculation (min) (ACUTE ONLY): 22  min   Charges:   PT Evaluation $PT Eval Low Complexity: 1 Low          Malachi ProGalen R Bentley Fissel, DPT 09/16/2018, 11:18 AM

## 2018-09-16 NOTE — Discharge Summary (Addendum)
Michelle Beck at [place] NAME: Michelle Beck    MR#:  [id number]  DATE OF BIRTH:  October 12, 1929  DATE OF ADMISSION:  09/14/2018 ADMITTING PHYSICIAN: Christel Mormon, MD  DATE OF DISCHARGE: 09/16/2018  PRIMARY CARE PHYSICIAN: Rusty Aus, MD    ADMISSION DIAGNOSIS:  Altered mental status, unspecified altered mental status type [R41.82]  DISCHARGE DIAGNOSIS:  Active Problems:   AMS (altered mental status)   Goals of care, counseling/discussion   Palliative care by specialist   DNR (do not resuscitate) discussion   SECONDARY DIAGNOSIS:   Past Medical History:  Diagnosis Date  . Arthritis   . Asthma   . Closed fracture of three ribs   . Closed fracture of unspecified part of neck of femur   . Closed fracture of unspecified part of vertebral column without mention of spinal cord injury   . Disturbance of salivary secretion   . Dysrhythmia   . Fall   . GERD (gastroesophageal reflux disease)   . Hypertension   . Osteoporosis, unspecified   . Pain in thoracic spine   . Paralysis agitans ([place])   . Parkinson's disease ([place])   . Pneumonia   . Scoliosis (and kyphoscoliosis), idiopathic   . Unspecified adverse effect of unspecified drug, medicinal and biological substance   . Unspecified disorder of plasma protein metabolism     HOSPITAL COURSE:  85 year old female with history of Parkinson's disease who is brought into the emergency room due to altered mental status.  1.  Acute metabolic encephalopathy with underlying cognitive impairment: Patient's mental status is at baseline.  MRI was negative for acute pathology along with CT head.  Her urine culture actually came back negative and therefore I do not feel patient had a UTI.  Chest x-ray was negative for acute infection.  B12 and RPR was unremarkable.  TSH was slightly elevated.  B1 is pending at this time. She does not need antibiotics at discharge given the above facts. She should however  have neurology outpatient follow-up due to her Parkinson's disease and possibility of some cognitive impairment and dementia related to underlying disease.  2  Parkinson's disease: Continue Sinemet  3  Continue ADA diet and Amaryl.  4.  Essential hypertension: Continue metoprolol   Physical therapy has recommended skilled nursing facility upon discharge however patient's husband says this is patient's baseline and he would like to bring the patient home. She would benefit from outpatient palliative care services.  DISCHARGE CONDITIONS AND DIET:   Stable Regular ADA diet  CONSULTS OBTAINED:  Treatment Team:  Bettey Costa, MD  DRUG ALLERGIES:   Allergies  Allergen Reactions  . Fentanyl Anxiety and Shortness Of Breath  . Actonel [Risedronate Sodium] Other (See Comments)    Reaction unknown  . Hydrocodone Other (See Comments)    Reaction unknown  . Iodine Other (See Comments)    Unknown  . Shellfish Allergy Other (See Comments)    Reaction unknown  . Penicillins Rash and Other (See Comments)    Has patient had a PCN reaction causing immediate rash, facial/tongue/throat swelling, SOB or lightheadedness with hypotension: Yes Has patient had a PCN reaction causing severe rash involving mucus membranes or skin necrosis: No Has patient had a PCN reaction that required hospitalization: No Has patient had a PCN reaction occurring within the last 10 years: No If all of the above answers are "NO", then may proceed with Cephalosporin use.     DISCHARGE MEDICATIONS:  Allergies as of 09/16/2018      Reactions   Fentanyl Anxiety, Shortness Of Breath   Actonel [risedronate Sodium] Other (See Comments)   Reaction unknown   Hydrocodone Other (See Comments)   Reaction unknown   Iodine Other (See Comments)   Unknown   Shellfish Allergy Other (See Comments)   Reaction unknown   Penicillins Rash, Other (See Comments)   Has patient had a PCN reaction causing immediate rash,  facial/tongue/throat swelling, SOB or lightheadedness with hypotension: Yes Has patient had a PCN reaction causing severe rash involving mucus membranes or skin necrosis: No Has patient had a PCN reaction that required hospitalization: No Has patient had a PCN reaction occurring within the last 10 years: No If all of the above answers are "NO", then may proceed with Cephalosporin use.      Medication List    TAKE these medications   Carbidopa-Levodopa ER 25-100 MG tablet controlled release Commonly known as: SINEMET CR Take 1 tablet by mouth 4 (four) times daily. 9:30 AM, 1:30 PM, 5:30 PM, 9:30 PM   Cholecalciferol 25 MCG (1000 UT) capsule Take 1,000 Units by mouth daily.   denosumab 60 MG/ML Soln injection Commonly known as: PROLIA Inject 60 mg into the skin every 6 (six) months. Administer in upper arm, thigh, or abdomen   docusate sodium 50 MG capsule Commonly known as: COLACE Take 50 mg by mouth daily.   glimepiride 1 MG tablet Commonly known as: AMARYL Take 1 mg by mouth daily with breakfast. Take if blood glucose >120   ibuprofen 200 MG tablet Commonly known as: ADVIL Take 1 tablet (200 mg total) by mouth every 8 (eight) hours as needed for moderate pain (with food).   metoprolol tartrate 25 MG tablet Commonly known as: LOPRESSOR Take 12.5 mg by mouth daily.   REFRESH OP Place 1 drop into both eyes 2 (two) times daily as needed (for dry eyes).   risperiDONE 1 MG tablet Commonly known as: RisperDAL Take 1 tablet (1 mg total) by mouth at bedtime as needed (agitaiton delirium).   rOPINIRole 2 MG tablet Commonly known as: REQUIP Take 1 tablet (2 mg total) by mouth 3 (three) times daily.         Today   CHIEF COMPLAINT:  Patient with some mild confusion this am but improved   VITAL SIGNS:  Blood pressure (!) 172/69, pulse 84, temperature (!) 97.5 F (36.4 C), temperature source Oral, resp. rate 20, height 5\' 4"  (1.626 m), weight 79.4 kg, SpO2 92  %.   REVIEW OF SYSTEMS:  Review of Systems  Constitutional: Positive for malaise/fatigue. Negative for chills and fever.  HENT: Negative.  Negative for ear discharge, ear pain, hearing loss, nosebleeds and sore throat.   Eyes: Negative.  Negative for blurred vision and pain.  Respiratory: Negative.  Negative for cough, hemoptysis, shortness of breath and wheezing.   Cardiovascular: Negative.  Negative for chest pain, palpitations and leg swelling.  Gastrointestinal: Negative.  Negative for abdominal pain, blood in stool, diarrhea, nausea and vomiting.  Genitourinary: Negative.  Negative for dysuria.  Musculoskeletal: Negative.  Negative for back pain.  Skin: Negative.   Neurological: Negative for dizziness, tremors, speech change, focal weakness, seizures and headaches.  Endo/Heme/Allergies: Negative.  Does not bruise/bleed easily.  Psychiatric/Behavioral: Positive for memory loss. Negative for depression, hallucinations and suicidal ideas.     PHYSICAL EXAMINATION:  GENERAL:  85 y.o.-year-old patient lying in the bed with no acute distress.  NECK:  Supple, no jugular  venous distention. No thyroid enlargement, no tenderness.  LUNGS: Normal breath sounds bilaterally, no wheezing, rales,rhonchi  No use of accessory muscles of respiration.  CARDIOVASCULAR: S1, S2 normal. No murmurs, rubs, or gallops.  ABDOMEN: Soft, non-tender, non-distended. Bowel sounds present. No organomegaly or mass.  EXTREMITIES: No pedal edema, cyanosis, or clubbing.  PSYCHIATRIC: The patient is alert and oriented x name and place not TIME SKIN: No obvious rash, lesion, or ulcer.   DATA REVIEW:   CBC Recent Labs  Lab 09/15/18 0103  WBC 5.7  HGB 11.9*  HCT 35.4*  PLT 80*    Chemistries  Recent Labs  Lab 09/14/18 1404 09/15/18 0103  NA 134* 138  K 4.6 3.8  CL 98 98  CO2 28 30  GLUCOSE 141* 144*  BUN 47* 37*  CREATININE 1.08* 0.85  CALCIUM 10.2 9.3  AST 95*  --   ALT 12  --   ALKPHOS 77  --    BILITOT 0.9  --     Cardiac Enzymes No results for input(s): TROPONINI in the last 168 hours.  Microbiology Results  @  RADIOLOGY:  Ct Abdomen Pelvis Wo Contrast  Result Date: 09/14/2018 CLINICAL DATA:  Abdominal distension. Right lower quadrant abdominal pain. EXAM: CT ABDOMEN AND PELVIS WITHOUT CONTRAST TECHNIQUE: Multidetector CT imaging of the abdomen and pelvis was performed following the standard protocol without IV contrast. COMPARISON:  None. FINDINGS: Lower chest: The heart is enlarged. There are trace bilateral pleural effusions with presumed adjacent atelectasis. Hepatobiliary: No focal liver abnormality is seen. No gallstones, gallbladder wall thickening, or biliary dilatation. Pancreas: Unremarkable. No pancreatic ductal dilatation or surrounding inflammatory changes. Spleen: Normal in size without focal abnormality. Adrenals/Urinary Tract: Adrenal glands are unremarkable. Kidneys are normal, without renal calculi, focal lesion, or hydronephrosis. The urinary bladder is significantly distended. Stomach/Bowel: There is a metallic object terminating near the patient's anus. This may represent a temperature probe. There is sigmoid diverticulosis without CT evidence of diverticulitis. There is a normal appendix in the right lower quadrant. Vascular/Lymphatic: Aortic atherosclerosis. No enlarged abdominal or pelvic lymph nodes. Reproductive: Uterus and bilateral adnexa are unremarkable. Other: No abdominal wall hernia or abnormality. No abdominopelvic ascites. Musculoskeletal: Patient is status post total hip arthroplasty on the right. There are multiple old compression fractures throughout the thoracolumbar spine. The patient is status post prior multilevel vertebral augmentation of the L2, T11, and T12 vertebral bodies. Multilevel degenerative changes are noted. IMPRESSION: 1. No acute intra-abdominal abnormality. There is a normal appendix in the right lower quadrant. 2. Trace  bilateral pleural effusions with adjacent atelectasis. 3. Significantly distended urinary bladder. 4. Scattered colonic diverticula without CT evidence of diverticulitis. Electronically Signed   By: Katherine Mantle M.D.   On: 09/14/2018 23:18   Dg Chest 1 View  Result Date: 09/14/2018 CLINICAL DATA:  Altered mental status. EXAM: CHEST  1 VIEW COMPARISON:  Chest x-ray dated Aug 16, 2010. FINDINGS: The patient is rotated to the right. The heart size and mediastinal contours are within normal limits. Atherosclerotic calcification of the aortic arch. Normal pulmonary vascularity. No focal consolidation, pleural effusion, or pneumothorax. No acute osseous abnormality. Old lower thoracic kyphoplasties. IMPRESSION: No active disease. Electronically Signed   By: Obie Dredge M.D.   On: 09/14/2018 16:27   Ct Head Wo Contrast  Result Date: 09/14/2018 CLINICAL DATA:  Altered mental status. EXAM: CT HEAD WITHOUT CONTRAST TECHNIQUE: Contiguous axial images were obtained from the base of the skull through the vertex without intravenous contrast. COMPARISON:  None. FINDINGS: Brain: No evidence of acute infarction, hemorrhage, hydrocephalus, extra-axial collection or mass lesion/mass effect. Mild generalized cerebral atrophy. Scattered mild periventricular and subcortical white matter hypodensities are nonspecific, but favored to reflect chronic microvascular ischemic changes. Vascular: Calcified atherosclerosis at the skullbase. No hyperdense vessel. Skull: Normal. Negative for fracture or focal lesion. Sinuses/Orbits: No acute finding. Large retention cyst in the left sphenoid sinus. Other: None. IMPRESSION: 1.  No acute intracranial abnormality. 2. Mild atrophy and chronic microvascular ischemic changes. Electronically Signed   By: Obie DredgeWilliam T Derry M.D.   On: 09/14/2018 16:47   Mr Brain Wo Contrast  Result Date: 09/15/2018 CLINICAL DATA:  Initial evaluation for acute confusion, unexplained. EXAM: MRI HEAD  WITHOUT CONTRAST TECHNIQUE: Multiplanar, multiecho pulse sequences of the brain and surrounding structures were obtained without intravenous contrast. COMPARISON:  Comparison made with prior CT from 09/14/2018. FINDINGS: Brain: Generalized age-related cerebral atrophy. Mild patchy T2/FLAIR hyperintensity within the periventricular and deep white matter both cerebral hemispheres most consistent with chronic small vessel ischemic disease. Superimposed remote lacunar infarcts present at the left thalamus and right paramedian pons. No abnormal foci of restricted diffusion to suggest acute or subacute ischemia. Gray-white matter differentiation maintained. No encephalomalacia to suggest chronic cortical infarction. No evidence for acute intracranial hemorrhage. Few punctate chronic micro hemorrhages noted centered about the deep gray nuclei, likely hypertensive in nature. Probable small DVA noted within the left cerebellum. Associated mild encephalomalacia with chronic hemosiderin staining (series 8, image 10). No mass lesion, midline shift or mass effect. No hydrocephalus. No extra-axial fluid collection. Pituitary gland within normal limits. Vascular: Major intracranial vascular flow voids are maintained. Skull and upper cervical spine: Craniocervical junction normal. Upper cervical spine within normal limits. Bone marrow signal intensity normal. No scalp soft tissue abnormality. Sinuses/Orbits: Patient status post bilateral ocular lens replacement. Chronic left sphenoid sinusitis. No mastoid effusion. Inner ear structures normal. Other: None. IMPRESSION: 1. No acute intracranial abnormality identified. 2. Age-related cerebral atrophy with mild chronic microvascular ischemic disease, with a few small remote lacunar infarcts involving the left thalamus and right paramedian pons. 3. Chronic left sphenoid sinusitis. Electronically Signed   By: Rise MuBenjamin  McClintock M.D.   On: 09/15/2018 14:53      Allergies as of  09/16/2018      Reactions   Fentanyl Anxiety, Shortness Of Breath   Actonel [risedronate Sodium] Other (See Comments)   Reaction unknown   Hydrocodone Other (See Comments)   Reaction unknown   Iodine Other (See Comments)   Unknown   Shellfish Allergy Other (See Comments)   Reaction unknown   Penicillins Rash, Other (See Comments)   Has patient had a PCN reaction causing immediate rash, facial/tongue/throat swelling, SOB or lightheadedness with hypotension: Yes Has patient had a PCN reaction causing severe rash involving mucus membranes or skin necrosis: No Has patient had a PCN reaction that required hospitalization: No Has patient had a PCN reaction occurring within the last 10 years: No If all of the above answers are "NO", then may proceed with Cephalosporin use.      Medication List    TAKE these medications   Carbidopa-Levodopa ER 25-100 MG tablet controlled release Commonly known as: SINEMET CR Take 1 tablet by mouth 4 (four) times daily. 9:30 AM, 1:30 PM, 5:30 PM, 9:30 PM   Cholecalciferol 25 MCG (1000 UT) capsule Take 1,000 Units by mouth daily.   denosumab 60 MG/ML Soln injection Commonly known as: PROLIA Inject 60 mg into the skin every 6 (six) months. Administer  in upper arm, thigh, or abdomen   docusate sodium 50 MG capsule Commonly known as: COLACE Take 50 mg by mouth daily.   glimepiride 1 MG tablet Commonly known as: AMARYL Take 1 mg by mouth daily with breakfast. Take if blood glucose >120   ibuprofen 200 MG tablet Commonly known as: ADVIL Take 1 tablet (200 mg total) by mouth every 8 (eight) hours as needed for moderate pain (with food).   metoprolol tartrate 25 MG tablet Commonly known as: LOPRESSOR Take 12.5 mg by mouth daily.   REFRESH OP Place 1 drop into both eyes 2 (two) times daily as needed (for dry eyes).   risperiDONE 1 MG tablet Commonly known as: RisperDAL Take 1 tablet (1 mg total) by mouth at bedtime as needed (agitaiton delirium).    rOPINIRole 2 MG tablet Commonly known as: REQUIP Take 1 tablet (2 mg total) by mouth 3 (three) times daily.          Management plans discussed with the patient;s husband and he is in agreement. Stable for discharge   Patient should follow up with PCP  CODE STATUS:     Code Status Orders  (From admission, onward)         Start     Ordered   09/15/18 1524  Do not attempt resuscitation (DNR)  Continuous    Question Answer Comment  In the event of cardiac or respiratory ARREST Do not call a "code blue"   In the event of cardiac or respiratory ARREST Do not perform Intubation, CPR, defibrillation or ACLS   In the event of cardiac or respiratory ARREST Use medication by any route, position, wound care, and other measures to relive pain and suffering. May use oxygen, suction and manual treatment of airway obstruction as needed for comfort.      09/15/18 1523        Code Status History    Date Active Date Inactive Code Status Order ID Comments User Context   09/14/2018 1853 09/15/2018 1523 Full Code [id number]  Pearletha AlfredSeals, Angela H, NP ED   Advance Care Planning Activity      TOTAL TIME TAKING CARE OF THIS PATIENT: 38 minutes.    Note: This dictation was prepared with Dragon dictation along with smaller phrase technology. Any transcriptional errors that result from this process are unintentional.  Adrian SaranSital Chidera Thivierge M.D on 09/16/2018 at 9:57 AM  Between 7am to 6pm - Pager - [phone] After 6pm go to [email or web address] - Social research officer, governmentpassword [email or web address]  [facility]  Office  [phone]  CC: Primary care physician; Danella PentonMiller, Mark F, MD

## 2018-09-16 NOTE — Progress Notes (Signed)
Mr Freshour notified of pts status. Waiting for pt to void since removal of catheter. Discharge instructions discussed with  Mr Pidcock. meds / diet actovity and f/u discussed. He verbalized understanding .ivfs and sl d/cd.

## 2018-09-16 NOTE — TOC Initial Note (Signed)
Transition of Care [facility](TOC) - Initial/Assessment Note    Patient Details  Name: Michelle Beck MRN: [id number] Date of Birth: 11-12-1929  Transition of Care [facility](TOC) CM/SW Contact:    Allayne ButcherJeanna M Cesily Cuoco, RN Phone Number: 09/16/2018, 9:28 AM  Clinical Narrative:                 Patient is from home with her husband.  Patient's husband reports that at home she is able to walk with a walker and she does have a wheelchair and he can push her around anywhere in the home in the wheelchair.  Physical therapy recommended SNF after working with patient this morning, husband would like to speak with his daughter about this and patient reports that she will do what she needs to and to talk with her husband about it.    [id number]:  Update- patient's husband has talked with his daughter and they do not want the patient to go to SNF.  Husband reports that they have been taking care of her for a long time and they can take care of her.  Patient has a walker, wheelchair and bedside commode at home.  Daughter comes over every Saturday to help her mom shower.  Patient's son lives next door.  Husband does not want home health set up he reports he will call Dr. Hyacinth MeekerMiller to set up home health if they feel they need it.  Patient will discharge home today by EMS transport.   Expected Discharge Plan: Skilled Nursing Facility Barriers to Discharge: Continued Medical Work up   Patient Goals and CMS Choice        Expected Discharge Plan and Services Expected Discharge Plan: Skilled Nursing Facility       Living arrangements for the past 2 months: Single Family Home Expected Discharge Date: 09/16/18                                    Prior Living Arrangements/Services Living arrangements for the past 2 months: Single Family Home Lives with:: Spouse Patient language and need for interpreter reviewed:: No Do you feel safe going back to the place where you live?: Yes      Need for Family Participation in Patient  Care: Yes (Comment)(mobility impairment) Care giver support system in place?: Yes (comment)(husband and daughter) Current home services: DME(walker, wheelchair, bedside commode) Criminal Activity/Legal Involvement Pertinent to Current Situation/Hospitalization: No - Comment as needed  Activities of Daily Living   ADL Screening (condition at time of admission) Patient's cognitive ability adequate to safely complete daily activities?: No Is the patient deaf or have difficulty hearing?: Yes Does the patient have difficulty seeing, even when wearing glasses/contacts?: No Does the patient have difficulty concentrating, remembering, or making decisions?: Yes Patient able to express need for assistance with ADLs?: Yes Does the patient have difficulty dressing or bathing?: Yes Independently performs ADLs?: No Communication: Needs assistance Is this a change from baseline?: Change from baseline, expected to last <3 days Dressing (OT): Needs assistance Is this a change from baseline?: Change from baseline, expected to last <3days Grooming: Needs assistance Is this a change from baseline?: Change from baseline, expected to last <3 days Feeding: Needs assistance Is this a change from baseline?: Change from baseline, expected to last <3 days Bathing: Needs assistance Is this a change from baseline?: Change from baseline, expected to last <3 days Toileting: Needs assistance Is this a change from  baseline?: Change from baseline, expected to last <3 days In/Out Bed: Needs assistance Is this a change from baseline?: Change from baseline, expected to last <3 days Walks in Home: Needs assistance Is this a change from baseline?: Change from baseline, expected to last <3 days Does the patient have difficulty walking or climbing stairs?: Yes Weakness of Legs: Both Weakness of Arms/Hands: Both  Permission Sought/Granted Permission sought to share information with : Family Supports, Case  Manager Permission granted to share information with : Yes, Verbal Permission Granted        Permission granted to share info w Relationship: husband and daughter     Emotional Assessment Appearance:: Appears stated age Attitude/Demeanor/Rapport: Engaged Affect (typically observed): Accepting Orientation: : Oriented to Self, Oriented to Place, Oriented to Situation Alcohol / Substance Use: Not Applicable Psych Involvement: No (comment)  Admission diagnosis:  Altered mental status, unspecified altered mental status type [R41.82] Patient Active Problem List   Diagnosis Date Noted  . Goals of care, counseling/discussion   . Palliative care by specialist   . DNR (do not resuscitate) discussion   . AMS (altered mental status) 09/14/2018  . Parkinson's disease ([place]) 08/24/2012   PCP:  Rusty Aus, MD Pharmacy:   [place], [place] - [address] [place] [address] Phone: [phone] Fax: [phone]     Social Determinants of Health (SDOH) Interventions    Readmission Risk Interventions No flowsheet data found.

## 2018-09-16 NOTE — TOC Transition Note (Signed)
Transition of Care [facility]) - CM/SW Discharge Note   Patient Details  Name: Michelle Beck MRN: [id number] Date of Birth: [birth year]  Transition of Care [facility] CM/SW Contact:  Shelbie Hutching, RN Phone Number: 09/16/2018, 9:46 AM   Clinical Narrative:     Patient to discharge home with husband.  No DME or home health needs.  Patient will transport with EMS.   Final next level of care: Home/Self Care Barriers to Discharge: Barriers Resolved   Patient Goals and CMS Choice Patient states their goals for this hospitalization and ongoing recovery are:: Family wants to take care of patient at home.      Discharge Placement                       Discharge Plan and Services                                     Social Determinants of Health (SDOH) Interventions     Readmission Risk Interventions No flowsheet data found.

## 2018-09-17 ENCOUNTER — Telehealth: Payer: Self-pay | Admitting: Neurology

## 2018-09-17 NOTE — Telephone Encounter (Signed)
The daughter stated, she was recently D/C from the hospital, she went in for acute onset AMS. I reviewed the hospital records. She had a good w/u, TSH was elevated. She had a brain MRI, which did not show anything acute.  Daughter is worried about giving her the risperdal she was Rx at discharge. I had an extended conv w daughter and explained that unfortunately, there are not a whole lot of medications that work for PD related psychosis/hall. She has ongoing visual hallucinations, risperdal is Rx 1 mg qHS prn. Daughter is advised, we can start the paper process for Nuplazid, make an appt for her for VV soon and in the meantime they can try to break the risperdal in quarters and give her 1/4 pill up to 4 times a day prn for disturbing delusions or hallucinations.  Cyril Mourning, please call [facility] to set up VV and start Rx for Nuplazid. Interestingly, since she came home from the hospital, her appetite is good and she has not had the severe nausea, she has had ongoing.

## 2018-09-17 NOTE — Telephone Encounter (Signed)
[facility] not on DPR called stating that the pt was taken to the hospital last night and was given new medication and the family would like to be advised on it.

## 2018-09-17 NOTE — Telephone Encounter (Signed)
I called mobile number listed and her husband picked up. He gave the ph to daughter, Michelle Beck. She

## 2018-09-19 ENCOUNTER — Other Ambulatory Visit: Payer: Self-pay

## 2018-09-19 ENCOUNTER — Emergency Department: Payer: Medicare Other

## 2018-09-19 ENCOUNTER — Inpatient Hospital Stay
Admission: EM | Admit: 2018-09-19 | Discharge: 2018-09-27 | DRG: 311 | Disposition: A | Discharge status: Hospice | Payer: Medicare Other | Attending: Internal Medicine | Admitting: Internal Medicine

## 2018-09-19 DIAGNOSIS — Z8701 Personal history of pneumonia (recurrent): Secondary | ICD-10-CM

## 2018-09-19 DIAGNOSIS — Z91013 Allergy to seafood: Secondary | ICD-10-CM

## 2018-09-19 DIAGNOSIS — R627 Adult failure to thrive: Secondary | ICD-10-CM | POA: Diagnosis present

## 2018-09-19 DIAGNOSIS — E870 Hyperosmolality and hypernatremia: Secondary | ICD-10-CM | POA: Diagnosis not present

## 2018-09-19 DIAGNOSIS — Z885 Allergy status to narcotic agent status: Secondary | ICD-10-CM

## 2018-09-19 DIAGNOSIS — Z791 Long term (current) use of non-steroidal anti-inflammatories (NSAID): Secondary | ICD-10-CM

## 2018-09-19 DIAGNOSIS — L89622 Pressure ulcer of left heel, stage 2: Secondary | ICD-10-CM | POA: Diagnosis present

## 2018-09-19 DIAGNOSIS — I248 Other forms of acute ischemic heart disease: Secondary | ICD-10-CM | POA: Diagnosis not present

## 2018-09-19 DIAGNOSIS — Z6825 Body mass index (BMI) 25.0-25.9, adult: Secondary | ICD-10-CM | POA: Diagnosis not present

## 2018-09-19 DIAGNOSIS — N179 Acute kidney failure, unspecified: Secondary | ICD-10-CM | POA: Diagnosis not present

## 2018-09-19 DIAGNOSIS — G20 Parkinson's disease: Secondary | ICD-10-CM | POA: Diagnosis present

## 2018-09-19 DIAGNOSIS — Z79899 Other long term (current) drug therapy: Secondary | ICD-10-CM

## 2018-09-19 DIAGNOSIS — R4182 Altered mental status, unspecified: Secondary | ICD-10-CM | POA: Diagnosis not present

## 2018-09-19 DIAGNOSIS — E872 Acidosis: Secondary | ICD-10-CM | POA: Diagnosis not present

## 2018-09-19 DIAGNOSIS — E440 Moderate protein-calorie malnutrition: Secondary | ICD-10-CM | POA: Diagnosis present

## 2018-09-19 DIAGNOSIS — Z880 Allergy status to penicillin: Secondary | ICD-10-CM | POA: Diagnosis not present

## 2018-09-19 DIAGNOSIS — I214 Non-ST elevation (NSTEMI) myocardial infarction: Secondary | ICD-10-CM | POA: Diagnosis present

## 2018-09-19 DIAGNOSIS — Z7189 Other specified counseling: Secondary | ICD-10-CM | POA: Diagnosis not present

## 2018-09-19 DIAGNOSIS — Z515 Encounter for palliative care: Secondary | ICD-10-CM | POA: Diagnosis not present

## 2018-09-19 DIAGNOSIS — E860 Dehydration: Secondary | ICD-10-CM | POA: Diagnosis present

## 2018-09-19 DIAGNOSIS — I10 Essential (primary) hypertension: Secondary | ICD-10-CM | POA: Diagnosis present

## 2018-09-19 DIAGNOSIS — F0280 Dementia in other diseases classified elsewhere without behavioral disturbance: Secondary | ICD-10-CM | POA: Diagnosis present

## 2018-09-19 DIAGNOSIS — Z8249 Family history of ischemic heart disease and other diseases of the circulatory system: Secondary | ICD-10-CM

## 2018-09-19 DIAGNOSIS — R531 Weakness: Secondary | ICD-10-CM | POA: Diagnosis present

## 2018-09-19 DIAGNOSIS — Z825 Family history of asthma and other chronic lower respiratory diseases: Secondary | ICD-10-CM

## 2018-09-19 DIAGNOSIS — Z1159 Encounter for screening for other viral diseases: Secondary | ICD-10-CM | POA: Diagnosis not present

## 2018-09-19 DIAGNOSIS — Z833 Family history of diabetes mellitus: Secondary | ICD-10-CM

## 2018-09-19 DIAGNOSIS — K219 Gastro-esophageal reflux disease without esophagitis: Secondary | ICD-10-CM | POA: Diagnosis present

## 2018-09-19 DIAGNOSIS — L89612 Pressure ulcer of right heel, stage 2: Secondary | ICD-10-CM | POA: Diagnosis present

## 2018-09-19 DIAGNOSIS — J45909 Unspecified asthma, uncomplicated: Secondary | ICD-10-CM | POA: Diagnosis present

## 2018-09-19 DIAGNOSIS — M810 Age-related osteoporosis without current pathological fracture: Secondary | ICD-10-CM | POA: Diagnosis present

## 2018-09-19 DIAGNOSIS — R55 Syncope and collapse: Secondary | ICD-10-CM | POA: Diagnosis present

## 2018-09-19 DIAGNOSIS — Z888 Allergy status to other drugs, medicaments and biological substances status: Secondary | ICD-10-CM

## 2018-09-19 DIAGNOSIS — I371 Nonrheumatic pulmonary valve insufficiency: Secondary | ICD-10-CM | POA: Diagnosis not present

## 2018-09-19 DIAGNOSIS — A0472 Enterocolitis due to Clostridium difficile, not specified as recurrent: Secondary | ICD-10-CM | POA: Diagnosis present

## 2018-09-19 DIAGNOSIS — L8990 Pressure ulcer of unspecified site, unspecified stage: Secondary | ICD-10-CM | POA: Insufficient documentation

## 2018-09-19 DIAGNOSIS — Z66 Do not resuscitate: Secondary | ICD-10-CM | POA: Diagnosis present

## 2018-09-19 DIAGNOSIS — G20A1 Parkinson's disease without dyskinesia, without mention of fluctuations: Secondary | ICD-10-CM | POA: Diagnosis present

## 2018-09-19 LAB — URINALYSIS, COMPLETE (UACMP) WITH MICROSCOPIC
Bacteria, UA: NONE SEEN
Bilirubin Urine: NEGATIVE
Glucose, UA: NEGATIVE mg/dL
Hgb urine dipstick: NEGATIVE
Ketones, ur: NEGATIVE mg/dL
Nitrite: NEGATIVE
Protein, ur: 100 mg/dL — AB
Specific Gravity, Urine: 1.014 (ref 1.005–1.030)
pH: 5 (ref 5.0–8.0)

## 2018-09-19 LAB — COMPREHENSIVE METABOLIC PANEL
ALT: 9 U/L (ref 0–44)
AST: 41 U/L (ref 15–41)
Albumin: 2.8 g/dL — ABNORMAL LOW (ref 3.5–5.0)
Alkaline Phosphatase: 70 U/L (ref 38–126)
Anion gap: 12 (ref 5–15)
BUN: 53 mg/dL — ABNORMAL HIGH (ref 8–23)
CO2: 24 mmol/L (ref 22–32)
Calcium: 9.3 mg/dL (ref 8.9–10.3)
Chloride: 106 mmol/L (ref 98–111)
Creatinine, Ser: 2.8 mg/dL — ABNORMAL HIGH (ref 0.44–1.00)
GFR calc Af Amer: 17 mL/min — ABNORMAL LOW (ref >60)
GFR calc non Af Amer: 15 mL/min — ABNORMAL LOW (ref >60)
Glucose, Bld: 175 mg/dL — ABNORMAL HIGH (ref 70–99)
Potassium: 4.2 mmol/L (ref 3.5–5.1)
Sodium: 142 mmol/L (ref 135–145)
Total Bilirubin: 0.5 mg/dL (ref 0.3–1.2)
Total Protein: 5.6 g/dL — ABNORMAL LOW (ref 6.5–8.1)

## 2018-09-19 LAB — CBC
HCT: 38.5 % (ref 36.0–46.0)
Hemoglobin: 12.6 g/dL (ref 12.0–15.0)
MCH: 30.1 pg (ref 26.0–34.0)
MCHC: 32.7 g/dL (ref 30.0–36.0)
MCV: 91.9 fL (ref 80.0–100.0)
Platelets: 128 10*3/uL — ABNORMAL LOW (ref 150–400)
RBC: 4.19 MIL/uL (ref 3.87–5.11)
RDW: 15.6 % — ABNORMAL HIGH (ref 11.5–15.5)
WBC: 9.9 10*3/uL (ref 4.0–10.5)
nRBC: 0 % (ref 0.0–0.2)

## 2018-09-19 LAB — APTT: aPTT: 41 seconds — ABNORMAL HIGH (ref 24–36)

## 2018-09-19 LAB — SARS CORONAVIRUS 2 BY RT PCR (HOSPITAL ORDER, PERFORMED IN ~~LOC~~ HOSPITAL LAB): SARS Coronavirus 2: NEGATIVE

## 2018-09-19 LAB — PROTIME-INR
INR: 0.9 (ref 0.8–1.2)
Prothrombin Time: 12.1 seconds (ref 11.4–15.2)

## 2018-09-19 LAB — LACTIC ACID, PLASMA: Lactic Acid, Venous: 1.5 mmol/L (ref 0.5–1.9)

## 2018-09-19 LAB — TROPONIN I (HIGH SENSITIVITY): Troponin I (High Sensitivity): 339 ng/L (ref <18)

## 2018-09-19 MED ORDER — HEPARIN BOLUS VIA INFUSION
4000.0000 [IU] | Freq: Once | INTRAVENOUS | Status: AC
  Administered 2018-09-19: 4000 [IU] via INTRAVENOUS
  Filled 2018-09-19: qty 4000

## 2018-09-19 MED ORDER — HEPARIN (PORCINE) 25000 UT/250ML-% IV SOLN
800.0000 [IU]/h | INTRAVENOUS | Status: DC
  Administered 2018-09-19: 800 [IU]/h via INTRAVENOUS
  Filled 2018-09-19: qty 250

## 2018-09-19 MED ORDER — SODIUM CHLORIDE 0.9% FLUSH
3.0000 mL | Freq: Once | INTRAVENOUS | Status: AC
  Administered 2018-09-22: 3 mL via INTRAVENOUS

## 2018-09-19 NOTE — ED Provider Notes (Signed)
[facility] Emergency Department Provider Note ____________________________________________   First MD Initiated Contact with Patient 09/19/18 2026     (approximate)  I have reviewed the triage vital signs and the nursing notes.   HISTORY  Chief Complaint Loss of Consciousness  Level 5 caveat: History of present illness limited due to altered mental status  HPI Michelle Beck is a 85 y.o. female with PMH as noted below who presents with a syncopal episode and generalized weakness and altered mental status.  Per EMS, the family reported syncope while the patient was having a bowel movement.  She had possible hypoxia to the high 80s on room air on EMS arrival.  The family reports that the patient has been generally very weak and confused since she was discharged from the hospital several days ago.  Past Medical History:  Diagnosis Date  . Arthritis   . Asthma   . Closed fracture of three ribs   . Closed fracture of unspecified part of neck of femur   . Closed fracture of unspecified part of vertebral column without mention of spinal cord injury   . Disturbance of salivary secretion   . Dysrhythmia   . Fall   . GERD (gastroesophageal reflux disease)   . Hypertension   . Osteoporosis, unspecified   . Pain in thoracic spine   . Paralysis agitans (HCC)   . Parkinson's disease (HCC)   . Pneumonia   . Scoliosis (and kyphoscoliosis), idiopathic   . Unspecified adverse effect of unspecified drug, medicinal and biological substance   . Unspecified disorder of plasma protein metabolism     Patient Active Problem List   Diagnosis Date Noted  . Goals of care, counseling/discussion   . Palliative care by specialist   . DNR (do not resuscitate) discussion   . AMS (altered mental status) 09/14/2018  . Parkinson's disease (HCC) 08/24/2012    Past Surgical History:  Procedure Laterality Date  . CATARACT EXTRACTION W/PHACO Right 09/29/2017   Procedure: CATARACT  EXTRACTION PHACO AND INTRAOCULAR LENS PLACEMENT (IOC);  Surgeon: Galen ManilaPorfilio, William, MD;  Location: [facility] ORS;  Service: Ophthalmology;  Laterality: Right;  US 00:39.3 AP% 14.9 CDE 5.85 Fluid pack lot # [id number]  . HERNIA REPAIR  2002  . HIP SURGERY      Prior to Admission medications   Medication Sig Start Date End Date Taking? Authorizing Provider  Carbidopa-Levodopa ER (SINEMET CR) 25-100 MG tablet controlled release Take 1 tablet by mouth 4 (four) times daily. 9:30 AM, 1:30 PM, 5:30 PM, 9:30 PM 03/02/18   Huston FoleyAthar, Saima, MD  Cholecalciferol 1000 units capsule Take 1,000 Units by mouth daily.     [provider]  denosumab (PROLIA) 60 MG/ML SOLN injection Inject 60 mg into the skin every 6 (six) months. Administer in upper arm, thigh, or abdomen    [provider]  docusate sodium (COLACE) 50 MG capsule Take 50 mg by mouth daily.     [provider]  glimepiride (AMARYL) 1 MG tablet Take 1 mg by mouth daily with breakfast. Take if blood glucose >120    [provider]  ibuprofen (ADVIL,MOTRIN) 200 MG tablet Take 1 tablet (200 mg total) by mouth every 8 (eight) hours as needed for moderate pain (with food). 12/16/14   Rockne MenghiniNorman, Anne-Caroline, MD  metoprolol tartrate (LOPRESSOR) 25 MG tablet Take 12.5 mg by mouth daily.  07/19/12   [provider]  Polyvinyl Alcohol-Povidone (REFRESH OP) Place 1 drop into both eyes 2 (two)  times daily as needed (for dry eyes).    [provider]  risperiDONE (RISPERDAL) 1 MG tablet Take 1 tablet (1 mg total) by mouth at bedtime as needed (agitaiton delirium). 09/16/18 09/16/19  Adrian SaranMody, Sital, MD  rOPINIRole (REQUIP) 2 MG tablet Take 1 tablet (2 mg total) by mouth 3 (three) times daily. 03/22/18   Huston FoleyAthar, Saima, MD    Allergies Fentanyl, Actonel [risedronate sodium], Hydrocodone, Iodine, Shellfish allergy, and Penicillins  Family History  Problem Relation Age of Onset  . Heart failure Mother   . Heart disease  Mother   . Emphysema Father   . COPD Father   . Diabetes Sister     Social History Social History   Tobacco Use  . Smoking status: Never Smoker  . Smokeless tobacco: Never Used  Substance Use Topics  . Alcohol use: No    Alcohol/week: 0.0 standard drinks  . Drug use: No    Review of Systems Level 5 caveat: Unable to obtain review of systems due to altered mental status    ____________________________________________   PHYSICAL EXAM:  VITAL SIGNS: ED Triage Vitals  Enc Vitals Group     BP 09/19/18 2010 (!) 109/43     Pulse Rate 09/19/18 2010 95     Resp 09/19/18 2010 17     Temp 09/19/18 2010 99.4 F (37.4 C)     Temp Source 09/19/18 2010 Rectal     SpO2 09/19/18 2010 93 %     Weight 09/19/18 2009 160 lb (72.6 kg)     Height 09/19/18 2009 5\' 4"  (1.626 m)     Head Circumference --      Peak Flow --      Pain Score --      Pain Loc --      Pain Edu? --      Excl. in GC? --     Constitutional: Alert, confused and very weak appearing.  Oriented to person and place. Eyes: Conjunctivae are normal.  EOMI.  PERRLA. Head: Atraumatic. Nose: No congestion/rhinnorhea. Mouth/Throat: Mucous membranes are somewhat dry.   Neck: Normal range of motion.  Cardiovascular: Normal rate, regular rhythm. Grossly normal heart sounds.  Good peripheral circulation. Respiratory: Normal respiratory effort.  No retractions. Lungs CTAB.  Intermittent cough. Gastrointestinal: Soft and nontender. No distention.  Genitourinary: No flank tenderness. Musculoskeletal:  Extremities warm and well perfused.  Neurologic: Somewhat slurred and faint speech.  Motor intact in all extremities.  Following commands appropriately. Skin:  Skin is warm and dry. No rash noted. Psychiatric: Unable to assess.  ____________________________________________   LABS (all labs ordered are listed, but only abnormal results are displayed)  Labs Reviewed  CBC - Abnormal; Notable for the following components:       Result Value   RDW 15.6 (*)    Platelets 128 (*)    All other components within normal limits  URINALYSIS, COMPLETE (UACMP) WITH MICROSCOPIC - Abnormal; Notable for the following components:   Color, Urine YELLOW (*)    APPearance CLOUDY (*)    Protein, ur 100 (*)    Leukocytes,Ua TRACE (*)    All other components within normal limits  COMPREHENSIVE METABOLIC PANEL - Abnormal; Notable for the following components:   Glucose, Bld 175 (*)    BUN 53 (*)    Creatinine, Ser 2.80 (*)    Total Protein 5.6 (*)    Albumin 2.8 (*)    GFR calc non Af Amer 15 (*)    GFR calc  Af Amer 17 (*)    All other components within normal limits  TROPONIN I (HIGH SENSITIVITY) - Abnormal; Notable for the following components:   Troponin I (High Sensitivity) 339 (*)    All other components within normal limits  SARS CORONAVIRUS 2 (HOSPITAL ORDER, PERFORMED IN [place] [facility])  CULTURE, BLOOD (ROUTINE X 2)  CULTURE, BLOOD (ROUTINE X 2)  LACTIC ACID, PLASMA   ____________________________________________  EKG  ED ECG REPORT I, Arta Silence, the attending physician, personally viewed and interpreted this ECG.  Date: 09/19/2018 EKG Time: 2007 Rate: 94 Rhythm: normal sinus rhythm QRS Axis: normal Intervals: normal ST/T Wave abnormalities: Nonspecific ST abnormalities laterally Narrative Interpretation: Nonspecific abnormalities; no recent prior EKG available for comparison  ____________________________________________  RADIOLOGY  CT head: No acute abnormality CXR: Bibasilar opacities  ____________________________________________   PROCEDURES  Procedure(s) performed: No  Procedures  Critical Care performed: Yes  CRITICAL CARE Performed by: Arta Silence   Total critical care time: 30 minutes  Critical care time was exclusive of separately billable procedures and treating other patients.  Critical care was necessary to treat or prevent imminent or  life-threatening deterioration.  Critical care was time spent personally by me on the following activities: development of treatment plan with patient and/or surrogate as well as nursing, discussions with consultants, evaluation of patient's response to treatment, examination of patient, obtaining history from patient or surrogate, ordering and performing treatments and interventions, ordering and review of laboratory studies, ordering and review of radiographic studies, pulse oximetry and re-evaluation of patient's condition. ____________________________________________   INITIAL IMPRESSION / ASSESSMENT AND PLAN / ED COURSE  Pertinent labs & imaging results that were available during my care of the patient were reviewed by me and considered in my medical decision making (see chart for details).  85 year old female with PMH as noted above including a history of Parkinson's disease presents with persistent generalized weakness since she was discharged from the hospital several days ago, as well as a syncopal episode today while on the toilet.  I reviewed the past medical records in [place].  The patient was seen in the ED on 09/14/2018 with altered mental status with negative CT head and MRI and elevated BUN.  She was discharged on 09/16/2018.  On exam today the patient is very weak appearing.  She appears somewhat confused but is oriented x2 and is following commands.  She has a borderline temperature and blood pressure with otherwise normal vital signs.  Neurologic exam is nonfocal.  The remainder of the exam is as described above.  Differential is broad but includes infection/sepsis, uremia, primary CNS cause, metabolic etiology, or less likely cardiac cause.  We will obtain CT head, lab labs, infection/sepsis work-up, and reassess.  ----------------------------------------- 10:40 PM on 09/19/2018 -----------------------------------------  CT head shows no acute findings.  Lab work-up is  significant for elevated troponin as well as AKI.  Although the patient has no chest pain, the troponin is concerning for MI.  The last EKG available for comparison is from 2012.  Despite the infiltrates on x-ray, the overall presentation is less consistent with pneumonia as the patient has no fever, elevated WBC count, or lactic acidosis.  COVID test is negative.  Given these findings, the patient will require readmission to the hospital.  I discussed the case with the hospitalist Dr. Jannifer Franklin and based on the elevated troponin we will start the patient on heparin.  _________________________________  Nickie Retort was evaluated in Emergency Department on 09/19/2018 for the  symptoms described in the history of present illness. She was evaluated in the context of the global COVID-19 pandemic, which necessitated consideration that the patient might be at risk for infection with the SARS-CoV-2 virus that causes COVID-19. Institutional protocols and algorithms that pertain to the evaluation of patients at risk for COVID-19 are in a state of rapid change based on information released by regulatory bodies including the CDC and federal and state organizations. These policies and algorithms were followed during the patient's care in the ED. ____________________________________________   FINAL CLINICAL IMPRESSION(S) / ED DIAGNOSES  Final diagnoses:  Non-ST elevated myocardial infarction (HCC)  Acute renal failure, unspecified acute renal failure type (HCC)  Altered mental status, unspecified altered mental status type      NEW MEDICATIONS STARTED DURING THIS VISIT:  New Prescriptions   No medications on file     Note:  This document was prepared using Dragon voice recognition software and may include unintentional dictation errors.    Dionne BucySiadecki, Mistina Coatney, MD 09/19/18 2242

## 2018-09-19 NOTE — ED Notes (Signed)
Gave son Lanore Renderos, son an update

## 2018-09-19 NOTE — ED Notes (Signed)
Patient returned to room from CT. 

## 2018-09-19 NOTE — ED Notes (Signed)
Family notified and updated regarding pending admission.

## 2018-09-19 NOTE — ED Notes (Signed)
Patient resting quietly at this time, no acute distress noted. 

## 2018-09-19 NOTE — H&P (Signed)
[facility] - [place] at [facility]   PATIENT NAME: Michelle Beck    MR#:  [id number]  DATE OF BIRTH:  Apr 11, 1929  DATE OF ADMISSION:  09/19/2018  PRIMARY CARE PHYSICIAN: Danella PentonMiller, Mark F, MD   REQUESTING/REFERRING PHYSICIAN: Marisa SeverinSiadecki, MD  CHIEF COMPLAINT:   Chief Complaint  Patient presents with  . Loss of Consciousness    HISTORY OF PRESENT ILLNESS:  Michelle Beamseggy Lampson  is a 85 y.o. female who presents with chief complaint as above.  Patient presents the ED after syncopal episode.  Here she is found to have a significantly elevated troponin.  Her contribution to HPI is limited due to dementia.  EKG has Q waves in inferior leads, with most recent EKG for comparison more than 8359 years old it is unclear when these changes developed.  Heparin drip was started and hospitalist were called for admission  PAST MEDICAL HISTORY:   Past Medical History:  Diagnosis Date  . Arthritis   . Asthma   . Closed fracture of three ribs   . Closed fracture of unspecified part of neck of femur   . Closed fracture of unspecified part of vertebral column without mention of spinal cord injury   . Disturbance of salivary secretion   . Dysrhythmia   . Fall   . GERD (gastroesophageal reflux disease)   . Hypertension   . Osteoporosis, unspecified   . Pain in thoracic spine   . Paralysis agitans (HCC)   . Parkinson's disease (HCC)   . Pneumonia   . Scoliosis (and kyphoscoliosis), idiopathic   . Unspecified adverse effect of unspecified drug, medicinal and biological substance   . Unspecified disorder of plasma protein metabolism      PAST SURGICAL HISTORY:   Past Surgical History:  Procedure Laterality Date  . CATARACT EXTRACTION W/PHACO Right 09/29/2017   Procedure: CATARACT EXTRACTION PHACO AND INTRAOCULAR LENS PLACEMENT (IOC);  Surgeon: Galen ManilaPorfilio, William, MD;  Location: [facility] ORS;  Service: Ophthalmology;  Laterality: Right;  US 00:39.3 AP% 14.9 CDE 5.85 Fluid pack lot #  [id number]  . HERNIA REPAIR  2002  . HIP SURGERY       SOCIAL HISTORY:   Social History   Tobacco Use  . Smoking status: Never Smoker  . Smokeless tobacco: Never Used  Substance Use Topics  . Alcohol use: No    Alcohol/week: 0.0 standard drinks     FAMILY HISTORY:   Family History  Problem Relation Age of Onset  . Heart failure Mother   . Heart disease Mother   . Emphysema Father   . COPD Father   . Diabetes Sister      DRUG ALLERGIES:   Allergies  Allergen Reactions  . Fentanyl Anxiety and Shortness Of Breath  . Actonel [Risedronate Sodium] Other (See Comments)    Reaction unknown  . Hydrocodone Other (See Comments)    Reaction unknown  . Iodine Other (See Comments)    Unknown  . Shellfish Allergy Other (See Comments)    Reaction unknown  . Penicillins Rash and Other (See Comments)    Has patient had a PCN reaction causing immediate rash, facial/tongue/throat swelling, SOB or lightheadedness with hypotension: Yes Has patient had a PCN reaction causing severe rash involving mucus membranes or skin necrosis: No Has patient had a PCN reaction that required hospitalization: No Has patient had a PCN reaction occurring within the last 10 years: No If all of the above answers are "NO", then may proceed with Cephalosporin use.  MEDICATIONS AT HOME:   Prior to Admission medications   Medication Sig Start Date End Date Taking? Authorizing Provider  Carbidopa-Levodopa ER (SINEMET CR) 25-100 MG tablet controlled release Take 1 tablet by mouth 4 (four) times daily. 9:30 AM, 1:30 PM, 5:30 PM, 9:30 PM 03/02/18  Yes Huston FoleyAthar, Saima, MD  Cholecalciferol 1000 units capsule Take 1,000 Units by mouth daily.    Yes [provider]  denosumab (PROLIA) 60 MG/ML SOLN injection Inject 60 mg into the skin every 6 (six) months. Administer in upper arm, thigh, or abdomen   Yes [provider]  docusate sodium (COLACE) 50 MG capsule Take 50 mg by mouth daily.    Yes  [provider]  ibuprofen (ADVIL,MOTRIN) 200 MG tablet Take 1 tablet (200 mg total) by mouth every 8 (eight) hours as needed for moderate pain (with food). 12/16/14  Yes Rockne MenghiniNorman, Anne-Caroline, MD  metoprolol tartrate (LOPRESSOR) 25 MG tablet Take 12.5 mg by mouth daily.  07/19/12  Yes [provider]  Polyvinyl Alcohol-Povidone (REFRESH OP) Place 1 drop into both eyes 2 (two) times daily as needed (for dry eyes).   Yes [provider]  risperiDONE (RISPERDAL) 1 MG tablet Take 1 tablet (1 mg total) by mouth at bedtime as needed (agitaiton delirium). 09/16/18 09/16/19 Yes Mody, Patricia PesaSital, MD  rOPINIRole (REQUIP) 2 MG tablet Take 1 tablet (2 mg total) by mouth 3 (three) times daily. 03/22/18  Yes Huston FoleyAthar, Saima, MD    REVIEW OF SYSTEMS:  Review of Systems  Unable to perform ROS: Dementia     VITAL SIGNS:   Vitals:   09/19/18 2010 09/19/18 2100 09/19/18 2200 09/19/18 2300  BP: (!) 109/43 (!) 123/59 (!) 124/55 (!) 98/49  Pulse: 95  85 77  Resp: 17 15 13 12   Temp: 99.4 F (37.4 C)     TempSrc: Rectal     SpO2: 93%  96% 98%  Weight:      Height:       Wt Readings from Last 3 Encounters:  09/19/18 72.6 kg  09/14/18 79.4 kg  09/29/17 73.9 kg    PHYSICAL EXAMINATION:  Physical Exam  Vitals reviewed. Constitutional: She appears well-developed and well-nourished. No distress.  HENT:  Head: Normocephalic and atraumatic.  Mouth/Throat: Oropharynx is clear and moist.  Eyes: Pupils are equal, round, and reactive to light. Conjunctivae and EOM are normal. No scleral icterus.  Neck: Normal range of motion. Neck supple. No JVD present. No thyromegaly present.  Cardiovascular: Normal rate, regular rhythm and intact distal pulses. Exam reveals no gallop and no friction rub.  No murmur heard. Respiratory: Effort normal and breath sounds normal. No respiratory distress. She has no wheezes. She has no rales.  GI: Soft. Bowel sounds are normal. She exhibits no distension. There  is no abdominal tenderness.  Musculoskeletal: Normal range of motion.        General: No edema.     Comments: No arthritis, no gout  Lymphadenopathy:    She has no cervical adenopathy.  Neurological: She is alert. No cranial nerve deficit.  Unable to fully assess due to dementia  Skin: Skin is warm and dry. No rash noted. No erythema.  Psychiatric:  Unable to assess due to dementia    LABORATORY PANEL:   CBC Recent Labs  Lab 09/19/18 2023  WBC 9.9  HGB 12.6  HCT 38.5  PLT 128*   ------------------------------------------------------------------------------------------------------------------  Chemistries  Recent Labs  Lab 09/19/18 2023  NA 142  K 4.2  CL  106  CO2 24  GLUCOSE 175*  BUN 53*  CREATININE 2.80*  CALCIUM 9.3  AST 41  ALT 9  ALKPHOS 70  BILITOT 0.5   ------------------------------------------------------------------------------------------------------------------  Cardiac Enzymes No results for input(s): TROPONINI in the last 168 hours. ------------------------------------------------------------------------------------------------------------------  RADIOLOGY:  Ct Head Wo Contrast  Result Date: 09/19/2018 CLINICAL DATA:  Syncope, altered level of consciousness. EXAM: CT HEAD WITHOUT CONTRAST TECHNIQUE: Contiguous axial images were obtained from the base of the skull through the vertex without intravenous contrast. COMPARISON:  MRI 09/15/2018, CT 09/14/2018 FINDINGS: Brain: There is atrophy and chronic small vessel disease changes. No acute intracranial abnormality. Specifically, no hemorrhage, hydrocephalus, mass lesion, acute infarction, or significant intracranial injury. Vascular: No hyperdense vessel or unexpected calcification. Skull: No acute calvarial abnormality. Sinuses/Orbits: No acute finding Other: None IMPRESSION: Atrophy, chronic microvascular disease. No acute intracranial abnormality. Electronically Signed   By: Rolm Baptise M.D.   On:  09/19/2018 21:34   Dg Chest Portable 1 View  Result Date: 09/19/2018 CLINICAL DATA:  Cough and syncope EXAM: PORTABLE CHEST 1 VIEW COMPARISON:  September 14, 2018 FINDINGS: Examination is limited by patient positioning. The right lung apex is suboptimally evaluated. Heart size is stable from prior study. There are aortic calcifications. Bibasilar airspace opacities are noted. There are findings suspicious for small bilateral pleural effusions, right greater than left. The patient is status post prior multilevel vertebral augmentation. There is inferior subluxation of the left humerus. There are degenerative changes of the right glenohumeral joint. IMPRESSION: 1. Limited exam secondary to patient positioning as detailed above. 2. Stable cardiac silhouette. 3. Bibasilar airspace opacities favored to represent atelectasis with aspiration or an infiltrate not excluded. There are likely trace bilateral pleural effusions. 4. Inferior subluxation of the left humerus. Correlation with physical exam is recommended if there is clinical concern for frank dislocation follow-up with dedicated left shoulder radiographs is recommended. Electronically Signed   By: Constance Holster M.D.   On: 09/19/2018 20:54    EKG:   Orders placed or performed during the hospital encounter of 09/19/18  . ED EKG  . ED EKG  . ED EKG  . ED EKG  . EKG 12-Lead  . EKG 12-Lead    IMPRESSION AND PLAN:  Principal Problem:   NSTEMI (non-ST elevated myocardial infarction) (HCC) -Heparin drip started, trend cardiac enzymes, patient is currently asymptomatic, get an echocardiogram and a cardiology consult Active Problems:   Parkinson's disease ([place]) -continue home meds   HTN (hypertension) -home dose antihypertensives   GERD (gastroesophageal reflux disease) -home dose PPI  Chart review performed and case discussed with ED provider. Labs, imaging and/or ECG reviewed by provider and discussed with patient/family. Management plans  discussed with the patient and/or family.  COVID-19 status: Test pending  DVT PROPHYLAXIS: Systemic anticoagulation  GI PROPHYLAXIS:  PPI   ADMISSION STATUS: Inpatient     CODE STATUS: DNR Code Status History    Date Active Date Inactive Code Status Order ID Comments User Context   09/15/2018 1523 09/16/2018 2106 DNR [id number]  Drue Novel, NP Inpatient   09/14/2018 1853 09/15/2018 1523 Full Code [id number]  Mayer Camel, NP ED   Advance Care Planning Activity    Questions for Most Recent Historical Code Status (Order [id number])    Question Answer Comment   In the event of cardiac or respiratory ARREST Do not call a "code blue"    In the event of cardiac or respiratory ARREST Do not perform Intubation, CPR, defibrillation or ACLS  In the event of cardiac or respiratory ARREST Use medication by any route, position, wound care, and other measures to relive pain and suffering. May use oxygen, suction and manual treatment of airway obstruction as needed for comfort.         Advance Directive Documentation     Most Recent Value  Type of Advance Directive  Healthcare Power of Attorney, Living will  Pre-existing out of facility DNR order (yellow form or pink MOST form)  -  "MOST" Form in Place?  -      TOTAL TIME TAKING CARE OF THIS PATIENT: 45 minutes.   This patient was evaluated in the context of the global COVID-19 pandemic, which necessitated consideration that the patient might be at risk for infection with the SARS-CoV-2 virus that causes COVID-19. Institutional protocols and algorithms that pertain to the evaluation of patients at risk for COVID-19 are in a state of rapid change based on information released by regulatory bodies including the CDC and federal and state organizations. These policies and algorithms were followed to the best of this provider's knowledge to date during the patient's care at this facility.  Jeven Topper F Arwin Bisceglia 09/19/2018, 11Barney Drain:49 PM  [facility]  Office  [phone]  CC: Primary care physician; Danella PentonMiller, Mark F, MD  Note:  This document was prepared using Dragon voice recognition software and may include unintentional dictation errors.

## 2018-09-19 NOTE — Consult Note (Signed)
ANTICOAGULATION CONSULT NOTE - Follow Up Consult  Pharmacy Consult for Heparin dosing and monitoring Indication: chest pain/ACS  Allergies  Allergen Reactions  . Fentanyl Anxiety and Shortness Of Breath  . Actonel [Risedronate Sodium] Other (See Comments)    Reaction unknown  . Hydrocodone Other (See Comments)    Reaction unknown  . Iodine Other (See Comments)    Unknown  . Shellfish Allergy Other (See Comments)    Reaction unknown  . Penicillins Rash and Other (See Comments)    Has patient had a PCN reaction causing immediate rash, facial/tongue/throat swelling, SOB or lightheadedness with hypotension: Yes Has patient had a PCN reaction causing severe rash involving mucus membranes or skin necrosis: No Has patient had a PCN reaction that required hospitalization: No Has patient had a PCN reaction occurring within the last 10 years: No If all of the above answers are "NO", then may proceed with Cephalosporin use.     Patient Measurements: Height: 5\' 4"  (162.6 cm) Weight: 160 lb (72.6 kg) IBW/kg (Calculated) : 54.7 Heparin Dosing Weight: 69 kg  Vital Signs: Temp: 99.4 F (37.4 C) (06/28 2010) Temp Source: Rectal (06/28 2010) BP: 124/55 (06/28 2200) Pulse Rate: 85 (06/28 2200)  Labs: Recent Labs    09/19/18 2023  HGB 12.6  HCT 38.5  PLT 128*  APTT 41*  LABPROT 12.1  INR 0.9  CREATININE 2.80*  TROPONINIHS 339*    Estimated Creatinine Clearance: 13.8 mL/min (A) (by C-G formula based on SCr of 2.8 mg/dL (H)).  Assessment: Pharmacy consulted for heparin dosing and monitoring in 85 yo female for ACS/NSTEMI. No reported anticoagulants PTA.    Goal of Therapy:  Heparin level 0.3-0.7 units/ml Monitor platelets by anticoagulation protocol: Yes   Plan:  Baseline labs ordered Give 4000 units bolus x 1 Start heparin infusion at 800 units/hr Check anti-Xa level in 8 hours and daily while on heparin Plts were 128 on admission- continue to monitor.  Continue to  monitor H&H.  Pernell Dupre, PharmD, BCPS Clinical Pharmacist 09/19/2018 11:02 PM

## 2018-09-19 NOTE — ED Triage Notes (Addendum)
Patient to [facility] via EMS from home.  Per EMS family reported patient with syncopal episode while having a bowel movement.  Enroute EMS noted cough.  EMS reports 1st responders reported 89% on room air up to 98% on 12 liter via non rebreather.  EMS reports they go 91% on room air that came up to 97% with 3 liter via nasal cannula.

## 2018-09-20 ENCOUNTER — Inpatient Hospital Stay (HOSPITAL_COMMUNITY)
Admit: 2018-09-20 | Discharge: 2018-09-20 | Disposition: A | Discharge status: Home / Self Care | Payer: Medicare Other | Attending: Internal Medicine | Admitting: Internal Medicine

## 2018-09-20 DIAGNOSIS — R4182 Altered mental status, unspecified: Secondary | ICD-10-CM

## 2018-09-20 DIAGNOSIS — R531 Weakness: Secondary | ICD-10-CM

## 2018-09-20 DIAGNOSIS — Z7189 Other specified counseling: Secondary | ICD-10-CM

## 2018-09-20 DIAGNOSIS — I214 Non-ST elevation (NSTEMI) myocardial infarction: Secondary | ICD-10-CM

## 2018-09-20 DIAGNOSIS — N179 Acute kidney failure, unspecified: Secondary | ICD-10-CM

## 2018-09-20 DIAGNOSIS — Z515 Encounter for palliative care: Secondary | ICD-10-CM

## 2018-09-20 DIAGNOSIS — I371 Nonrheumatic pulmonary valve insufficiency: Secondary | ICD-10-CM

## 2018-09-20 DIAGNOSIS — L8990 Pressure ulcer of unspecified site, unspecified stage: Secondary | ICD-10-CM | POA: Insufficient documentation

## 2018-09-20 LAB — CBC
HCT: 39.2 % (ref 36.0–46.0)
Hemoglobin: 12.7 g/dL (ref 12.0–15.0)
MCH: 30 pg (ref 26.0–34.0)
MCHC: 32.4 g/dL (ref 30.0–36.0)
MCV: 92.7 fL (ref 80.0–100.0)
Platelets: 145 10*3/uL — ABNORMAL LOW (ref 150–400)
RBC: 4.23 MIL/uL (ref 3.87–5.11)
RDW: 15.7 % — ABNORMAL HIGH (ref 11.5–15.5)
WBC: 11.1 10*3/uL — ABNORMAL HIGH (ref 4.0–10.5)
nRBC: 0 % (ref 0.0–0.2)

## 2018-09-20 LAB — GASTROINTESTINAL PANEL BY PCR, STOOL (REPLACES STOOL CULTURE)

## 2018-09-20 LAB — BASIC METABOLIC PANEL
Anion gap: 11 (ref 5–15)
BUN: 58 mg/dL — ABNORMAL HIGH (ref 8–23)
CO2: 25 mmol/L (ref 22–32)
Calcium: 9 mg/dL (ref 8.9–10.3)
Chloride: 107 mmol/L (ref 98–111)
Creatinine, Ser: 2.91 mg/dL — ABNORMAL HIGH (ref 0.44–1.00)
GFR calc Af Amer: 16 mL/min — ABNORMAL LOW (ref >60)
GFR calc non Af Amer: 14 mL/min — ABNORMAL LOW (ref >60)
Glucose, Bld: 120 mg/dL — ABNORMAL HIGH (ref 70–99)
Potassium: 4.2 mmol/L (ref 3.5–5.1)
Sodium: 143 mmol/L (ref 135–145)

## 2018-09-20 LAB — C DIFFICILE QUICK SCREEN W PCR REFLEX
C Diff antigen: POSITIVE — AB
C Diff interpretation: DETECTED
C Diff toxin: POSITIVE — AB

## 2018-09-20 LAB — HEPARIN LEVEL (UNFRACTIONATED): Heparin Unfractionated: 0.74 IU/mL — ABNORMAL HIGH (ref 0.30–0.70)

## 2018-09-20 LAB — TROPONIN I (HIGH SENSITIVITY)
Troponin I (High Sensitivity): 406 ng/L (ref <18)
Troponin I (High Sensitivity): 434 ng/L (ref <18)

## 2018-09-20 LAB — ECHOCARDIOGRAM COMPLETE
Height: 64 in
Weight: 2560 oz

## 2018-09-20 MED ORDER — RISPERIDONE 0.25 MG PO TABS
0.2500 mg | ORAL_TABLET | Freq: Four times a day (QID) | ORAL | Status: DC | PRN
  Administered 2018-09-26 (×2): 0.25 mg via ORAL
  Filled 2018-09-20 (×3): qty 1

## 2018-09-20 MED ORDER — METOPROLOL TARTRATE 25 MG PO TABS
12.5000 mg | ORAL_TABLET | Freq: Every day | ORAL | Status: DC
  Administered 2018-09-20 – 2018-09-27 (×8): 12.5 mg via ORAL
  Filled 2018-09-20 (×8): qty 1

## 2018-09-20 MED ORDER — SODIUM CHLORIDE 0.9 % IV SOLN
INTRAVENOUS | Status: DC
  Administered 2018-09-20 – 2018-09-22 (×3): via INTRAVENOUS

## 2018-09-20 MED ORDER — ONDANSETRON HCL 4 MG PO TABS: 4.0000 mg | ORAL_TABLET | Freq: Four times a day (QID) | ORAL | Status: DC | PRN

## 2018-09-20 MED ORDER — ACETAMINOPHEN 650 MG RE SUPP: 650.0000 mg | Freq: Four times a day (QID) | RECTAL | Status: DC | PRN

## 2018-09-20 MED ORDER — CARBIDOPA-LEVODOPA ER 25-100 MG PO TBCR
1.0000 | EXTENDED_RELEASE_TABLET | Freq: Four times a day (QID) | ORAL | Status: DC
  Administered 2018-09-20 – 2018-09-27 (×30): 1 via ORAL
  Filled 2018-09-20 (×32): qty 1

## 2018-09-20 MED ORDER — HEPARIN SODIUM (PORCINE) 5000 UNIT/ML IJ SOLN
5000.0000 [IU] | Freq: Three times a day (TID) | INTRAMUSCULAR | Status: DC
  Administered 2018-09-20 – 2018-09-27 (×22): 5000 [IU] via SUBCUTANEOUS
  Filled 2018-09-20 (×24): qty 1

## 2018-09-20 MED ORDER — VANCOMYCIN 50 MG/ML ORAL SOLUTION
125.0000 mg | Freq: Four times a day (QID) | ORAL | Status: DC
  Administered 2018-09-20 – 2018-09-27 (×28): 125 mg via ORAL
  Filled 2018-09-20 (×34): qty 2.5

## 2018-09-20 MED ORDER — ONDANSETRON HCL 4 MG/2ML IJ SOLN: 4.0000 mg | Freq: Four times a day (QID) | INTRAMUSCULAR | Status: DC | PRN

## 2018-09-20 MED ORDER — ACETAMINOPHEN 325 MG PO TABS: 650.0000 mg | ORAL_TABLET | Freq: Four times a day (QID) | ORAL | Status: DC | PRN

## 2018-09-20 MED ORDER — ROPINIROLE HCL 1 MG PO TABS
2.0000 mg | ORAL_TABLET | Freq: Three times a day (TID) | ORAL | Status: DC
  Administered 2018-09-20 – 2018-09-27 (×22): 2 mg via ORAL
  Filled 2018-09-20 (×23): qty 2

## 2018-09-20 NOTE — Consult Note (Signed)
Consultation Note Date: 09/20/2018   Patient Name: Michelle Beck  DOB: [birth year]  MRN: [id number]  Age / Sex: 85 y.o., female  PCP: Danella PentonMiller, Mark F, MD Referring Physician: Enid BaasKalisetti, Radhika, MD  Reason for Consultation: Establishing goals of care  HPI/Patient Profile: Michelle Beck  is a 85 y.o. female who presents with chief complaint as above.  Patient presents the ED after syncopal episode. She was found to have elevated troponin, creatinine,and C-diff.   Clinical Assessment and Goals of Care: Patient is resting in bed. She cracks her eyes open to look at me. She says "no" when asked if she has pain. She asked what time it is. She did not say anything further.  Spoke with her husband. He states there are 3 children. He states they have been married 60 years, and it has been the two of them until recently when they needed to rely on their children to help move her due to weakness. He states she was able to do house work until last admission on 6/23. Now, she uses a rolator, and at times needs to sit on the seat to get to the bathroom due to weakness. He states she has had Parkinson's for 18 years, and has delt with morning nausea for several years. He states over the past week, this has not been an issue, but the nausea was exchanged for the weakness. He states she has been eating and drinking well.  He states the hospital providers have kept him well updated, and he is hopeful reversing the dehydration from C-diff will helpt the demand ischemia and her kidney function. He states his daughter used to work for hospice, and called about getting his wife into hospice care as they need a hospital bed and help in the home. He states they have been looking into sitters. Discussed continued aggressive/life prolonging care vs comfort focused care.  He states he is unsure about care moving forward. He states he will need to  give this thought and speak with the children. He confirms DNR/DNI status.    SUMMARY OF RECOMMENDATIONS   DNR/DNI Will continue to follow. Husband is unsure about plan of care moving forward, and wants to see how she does moving forward.   Prognosis:   Unable to determine  Discharge Planning: To Be Determined      Primary Diagnoses: Present on Admission: . Parkinson's disease (HCC) . HTN (hypertension) . GERD (gastroesophageal reflux disease) . NSTEMI (non-ST elevated myocardial infarction) (HCC)   I have reviewed the medical record, interviewed the patient and family, and examined the patient. The following aspects are pertinent.  Past Medical History:  Diagnosis Date  . Arthritis   . Asthma   . Closed fracture of three ribs   . Closed fracture of unspecified part of neck of femur   . Closed fracture of unspecified part of vertebral column without mention of spinal cord injury   . Disturbance of salivary secretion   . Dysrhythmia   . Fall   . GERD (gastroesophageal  reflux disease)   . Hypertension   . Osteoporosis, unspecified   . Pain in thoracic spine   . Paralysis agitans (HCC)   . Parkinson's disease (HCC)   . Pneumonia   . Scoliosis (and kyphoscoliosis), idiopathic   . Unspecified adverse effect of unspecified drug, medicinal and biological substance   . Unspecified disorder of plasma protein metabolism    Social History   Socioeconomic History  . Marital status: Married    Spouse name: Not on file  . Number of children: Not on file  . Years of education: Not on file  . Highest education level: Not on file  Occupational History    Comment: retired  Engineer, productionocial Needs  . Financial resource strain: Not on file  . Food insecurity    Worry: Not on file    Inability: Not on file  . Transportation needs    Medical: Not on file    Non-medical: Not on file  Tobacco Use  . Smoking status: Never Smoker  . Smokeless tobacco: Never Used  Substance and Sexual  Activity  . Alcohol use: No    Alcohol/week: 0.0 standard drinks  . Drug use: No  . Sexual activity: Not on file  Lifestyle  . Physical activity    Days per week: Not on file    Minutes per session: Not on file  . Stress: Not on file  Relationships  . Social Musicianconnections    Talks on phone: Not on file    Gets together: Not on file    Attends religious service: Not on file    Active member of club or organization: Not on file    Attends meetings of clubs or organizations: Not on file    Relationship status: Not on file  Other Topics Concern  . Not on file  Social History Narrative   Lives at home w/ her husband   Family History  Problem Relation Age of Onset  . Heart failure Mother   . Heart disease Mother   . Emphysema Father   . COPD Father   . Diabetes Sister    Scheduled Meds: . Carbidopa-Levodopa ER  1 tablet Oral QID  . heparin injection (subcutaneous)  5,000 Units Subcutaneous Q8H  . metoprolol tartrate  12.5 mg Oral Daily  . rOPINIRole  2 mg Oral TID  . sodium chloride flush  3 mL Intravenous Once  . vancomycin  125 mg Oral Q6H   Continuous Infusions: . sodium chloride 75 mL/hr at 09/20/18 0806   PRN Meds:.acetaminophen **OR** acetaminophen, ondansetron **OR** ondansetron (ZOFRAN) IV, risperiDONE Medications Prior to Admission:  Prior to Admission medications   Medication Sig Start Date End Date Taking? Authorizing Provider  Carbidopa-Levodopa ER (SINEMET CR) 25-100 MG tablet controlled release Take 1 tablet by mouth 4 (four) times daily. 9:30 AM, 1:30 PM, 5:30 PM, 9:30 PM 03/02/18  Yes Huston FoleyAthar, Saima, MD  Cholecalciferol 1000 units capsule Take 1,000 Units by mouth daily.    Yes [provider]  denosumab (PROLIA) 60 MG/ML SOLN injection Inject 60 mg into the skin every 6 (six) months. Administer in upper arm, thigh, or abdomen   Yes [provider]  docusate sodium (COLACE) 50 MG capsule Take 50 mg by mouth daily.    Yes [provider]  ibuprofen (ADVIL,MOTRIN) 200 MG tablet Take 1 tablet (200 mg total) by mouth every 8 (eight) hours as needed for moderate pain (with food). 12/16/14  Yes Rockne MenghiniNorman, Anne-Caroline, MD  metoprolol tartrate (LOPRESSOR) 25  MG tablet Take 12.5 mg by mouth daily.  07/19/12  Yes [provider]  Polyvinyl Alcohol-Povidone (REFRESH OP) Place 1 drop into both eyes 2 (two) times daily as needed (for dry eyes).   Yes [provider]  risperiDONE (RISPERDAL) 1 MG tablet Take 1 tablet (1 mg total) by mouth at bedtime as needed (agitaiton delirium). 09/16/18 09/16/19 Yes Mody, Ulice Bold, MD  rOPINIRole (REQUIP) 2 MG tablet Take 1 tablet (2 mg total) by mouth 3 (three) times daily. 03/22/18  Yes Star Age, MD   Allergies  Allergen Reactions  . Fentanyl Anxiety and Shortness Of Breath  . Actonel [Risedronate Sodium] Other (See Comments)    Reaction unknown  . Hydrocodone Other (See Comments)    Reaction unknown  . Iodine Other (See Comments)    Unknown  . Shellfish Allergy Other (See Comments)    Reaction unknown  . Penicillins Rash and Other (See Comments)    Has patient had a PCN reaction causing immediate rash, facial/tongue/throat swelling, SOB or lightheadedness with hypotension: Yes Has patient had a PCN reaction causing severe rash involving mucus membranes or skin necrosis: No Has patient had a PCN reaction that required hospitalization: No Has patient had a PCN reaction occurring within the last 10 years: No If all of the above answers are "NO", then may proceed with Cephalosporin use.    Review of Systems  Unable to perform ROS   Physical Exam Constitutional:      Comments: Opens eyes slightly to voice.   Pulmonary:     Effort: Pulmonary effort is normal.  Abdominal:     Palpations: Abdomen is soft.  Skin:    General: Skin is warm and dry.     Vital Signs: BP (!) 115/52 (BP Location: Right Arm)   Pulse 88   Temp 98.5 F (36.9 C) (Oral)   Resp 19   Ht 5\' 4"   (1.626 m)   Wt 72.6 kg   SpO2 98%   BMI 27.46 kg/m  Pain Scale: PAINAD POSS *See Group Information*: 2-Acceptable,Slightly drowsy, easily aroused Pain Score: 0-No pain   SpO2: SpO2: 98 % O2 Device:SpO2: 98 % O2 Flow Rate: .O2 Flow Rate (L/min): 2 L/min  IO: Intake/output summary: No intake or output data in the 24 hours ending 09/20/18 1420  LBM: Last BM Date: 09/20/18 Baseline Weight: Weight: 72.6 kg Most recent weight: Weight: 72.6 kg     Palliative Assessment/Data:     Time In: 1:45 Time Out: 2:35 Time Total: 50 min Greater than 50%  of this time was spent counseling and coordinating care related to the above assessment and plan.  Signed by: Asencion Gowda, NP   Please contact Palliative Medicine Team phone at [phone] for questions and concerns.  For individual provider: See Shea Evans

## 2018-09-20 NOTE — Progress Notes (Signed)
Called and updated husband Michelle Beck over the phone today.  Apparently patient was in the hospital and just discharged last week for altered mental status.  It seems like all the work-up including brain MRI did not show anything acute.  Patient has been on risperidone for Parkinson related hallucination/psychosis. -According to outpatient neurology phone note, risperidone 1mg  to be cut in four quarters and given throughout the day as needed. -Worsening diarrhea over the weekend and now with significant altered mental status.  Continue to monitor, IV fluids and renal failure will be addressed here.

## 2018-09-20 NOTE — Evaluation (Signed)
Clinical/Bedside Swallow Evaluation Patient Details  Name: Michelle Beck MRN: [id number] Date of Birth: [birth year]  Today's Date: 09/20/2018 Time: SLP Start Time (ACUTE ONLY): 40 SLP Stop Time (ACUTE ONLY): 1545 SLP Time Calculation (min) (ACUTE ONLY): 35 min  Past Medical History:  Past Medical History:  Diagnosis Date  . Arthritis   . Asthma   . Closed fracture of three ribs   . Closed fracture of unspecified part of neck of femur   . Closed fracture of unspecified part of vertebral column without mention of spinal cord injury   . Disturbance of salivary secretion   . Dysrhythmia   . Fall   . GERD (gastroesophageal reflux disease)   . Hypertension   . Osteoporosis, unspecified   . Pain in thoracic spine   . Paralysis agitans ([place])   . Parkinson's disease ([place])   . Pneumonia   . Scoliosis (and kyphoscoliosis), idiopathic   . Unspecified adverse effect of unspecified drug, medicinal and biological substance   . Unspecified disorder of plasma protein metabolism    Past Surgical History:  Past Surgical History:  Procedure Laterality Date  . CATARACT EXTRACTION W/PHACO Right 09/29/2017   Procedure: CATARACT EXTRACTION PHACO AND INTRAOCULAR LENS PLACEMENT (IOC);  Surgeon: Birder Robson, MD;  Location: [facility] ORS;  Service: Ophthalmology;  Laterality: Right;  [place] 00:39.3 AP% 14.9 CDE 5.85 Fluid pack lot # [id number]  . HERNIA REPAIR  2002  . HIP SURGERY     HPI:  Per admitting H&P: Michelle Beck  is a 84 y.o. female who presents with chief complaint as above.  Patient presents the ED after syncopal episode.  Here she is found to have a significantly elevated troponin.  Her contribution to HPI is limited due to dementia.  EKG has Q waves in inferior leads, with most recent EKG for comparison more than 85 years old it is unclear when these changes developed.  Heparin drip was started and hospitalist were called for admission   Assessment / Plan / Recommendation Clinical Impression   This pleasant but lethargic 85 y/o female dx'd w/Parkinson's disease presents with positive s/s oropharyngeal dysphagia. Nsg reported positive s/s aspiration with thin liquids at lunch today and very slow oral prep with meds given crushed in puree. Oral mech exam revealed mod to severe generalized bilateral oral motor weakness & decreased coordination. Oral phase of swallow c/b mod oral prep difficulties with mod A-P transit delay. Pt required consistent cues to swallow, pt often unaware she had not swallowed after initial attempt to prep & propel bolus posteriorly. No apparent overt s/s aspiration observed with tsp's NECTAR THICK liquid or tsp's puree. Due to lethargy, thin liquids and solids were not tested. Noted very low vocal intensity and hoarse vocal quality, often had to ask pt to repeat verbalizations to be understood. Pt was previously on a regular diet with thin liquids, reported to be eating and drinking well at home. Due to lethargy, decreased cognition, and decreased oral control, recommend Dysphagia I (PUREE) diet with NECTAR THICK LIQUIDS BY TSP, give meds crushed in puree, add extra pillows or folded up towel to assist proper head positioning and avoid neck hyperextension. Pt will require TOTAL ASSIST with all PO intake and REGULAR VERBAL CUES TO SWALLOW. Ensure pt has swallowed before offering next tsp of puree or thickened liquid.  SLP to continue to f/u with toleration of diet and offer trials of upgraded consistency as appropriate. Discussed results and rec's with nsg, nsg stated agreement and understanding.  It should be noted, pt is unlikely to obtain adequate nutrition via mouth due to current deficits. Rec. Dietician consult for nutritional support. SLP Visit Diagnosis: Dysphagia, oropharyngeal phase (R13.12)    Aspiration Risk  Moderate aspiration risk;Risk for inadequate nutrition/hydration    Diet Recommendation Dysphagia 1 (Puree);Nectar-thick liquid   Liquid Administration  via: Spoon Medication Administration: Crushed with puree Supervision: Full supervision/cueing for compensatory strategies Compensations: Minimize environmental distractions;Slow rate;Small sips/bites;Other (Comment) Postural Changes: (Add rolled up towel or additional pillow to support head to )    Other  Recommendations Oral Care Recommendations: Oral care QID   Follow up Recommendations        Frequency and Duration min 2x/week  1 week       Prognosis Prognosis for Safe Diet Advancement: Fair Barriers to Reach Goals: Cognitive deficits;Severity of deficits      Swallow Study   General Date of Onset: 09/20/18 HPI: Per admitting H&P: Michelle Beck  is a 85 y.o. female who presents with chief complaint as above.  Patient presents the ED after syncopal episode.  Here she is found to have a significantly elevated troponin.  Her contribution to HPI is limited due to dementia.  EKG has Q waves in inferior leads, with most recent EKG for comparison more than 85 years old it is unclear when these changes developed.  Heparin drip was started and hospitalist were called for admission Type of Study: Bedside Swallow Evaluation Diet Prior to this Study: Regular;Thin liquids Temperature Spikes Noted: No(WBC rising) Respiratory Status: Room air History of Recent Intubation: No Behavior/Cognition: Lethargic/Drowsy;Requires cueing Oral Cavity Assessment: Dry Oral Cavity - Dentition: Adequate natural dentition Self-Feeding Abilities: Total assist Patient Positioning: Upright in bed;Other (comment)(Required rolled up towel under 2 pillows for proper head sup) Baseline Vocal Quality: Hoarse;Low vocal intensity Volitional Cough: Weak Volitional Swallow: Unable to elicit    Oral/Motor/Sensory Function Overall Oral Motor/Sensory Function: Generalized oral weakness(Mod to severe) Facial Symmetry: Within Functional Limits   Ice Chips Ice chips: Not tested   Thin Liquid Thin Liquid: Not tested     Nectar Thick Nectar Thick Liquid: Impaired Presentation: Spoon Oral Phase Impairments: Reduced lingual movement/coordination Oral phase functional implications: Prolonged oral transit   Honey Thick Honey Thick Liquid: Not tested   Puree Puree: Impaired Presentation: Spoon Oral Phase Impairments: Reduced lingual movement/coordination Oral Phase Functional Implications: Prolonged oral transit   Solid     Solid: Not tested      Kathrine Rieves, MA, CCC-SLP 09/20/2018,3:53 PM

## 2018-09-20 NOTE — Progress Notes (Signed)
Arrived to floor, no family present with patient.  Patient unable to provide information.

## 2018-09-20 NOTE — Progress Notes (Signed)
[employer] [facility] [place] [facility]   PATIENT NAME: Michelle Beck    MR#:  [id number]  DATE OF BIRTH:  1929-08-14  SUBJECTIVE:  CHIEF COMPLAINT:   Chief Complaint  Patient presents with   Loss of Consciousness   Patient very drowsy, arousable to her name.  Not following simple commands. -She was lying in a loose watery stool when he walked  REVIEW OF SYSTEMS:  Review of Systems  Unable to perform ROS: Mental status change    DRUG ALLERGIES:   Allergies  Allergen Reactions   Fentanyl Anxiety and Shortness Of Breath   Actonel [Risedronate Sodium] Other (See Comments)    Reaction unknown   Hydrocodone Other (See Comments)    Reaction unknown   Iodine Other (See Comments)    Unknown   Shellfish Allergy Other (See Comments)    Reaction unknown   Penicillins Rash and Other (See Comments)    Has patient had a PCN reaction causing immediate rash, facial/tongue/throat swelling, SOB or lightheadedness with hypotension: Yes Has patient had a PCN reaction causing severe rash involving mucus membranes or skin necrosis: No Has patient had a PCN reaction that required hospitalization: No Has patient had a PCN reaction occurring within the last 10 years: No If all of the above answers are "NO", then may proceed with Cephalosporin use.     VITALS:  Blood pressure (!) 115/52, pulse 88, temperature 98.5 F (36.9 C), temperature source Oral, resp. rate 19, height 5\' 4"  (1.626 m), weight 72.6 kg, SpO2 98 %.  PHYSICAL EXAMINATION:  Physical Exam  GENERAL:  85 y.o.-year-old elderly patient lying in the bed with no acute distress.  EYES: Pupils equal, round, reactive to light and accommodation. No scleral icterus. Extraocular muscles intact.  HEENT: Head atraumatic, normocephalic. Oropharynx and nasopharynx clear.  NECK:  Supple, no jugular venous distention. No thyroid enlargement, no tenderness.  LUNGS: Normal breath sounds bilaterally, no wheezing,  rales,rhonchi or crepitation. No use of accessory muscles of respiration.  Decreased bibasilar breath sounds. CARDIOVASCULAR: S1, S2 normal. No  rubs, or gallops.  3/6 systolic murmur is present ABDOMEN: Soft, nontender, nondistended. Bowel sounds present. No organomegaly or mass.  EXTREMITIES: No pedal edema, cyanosis, or clubbing.  NEUROLOGIC: Drowsy, easily arousable and able to answer some simple questions.  Not following command PSYCHIATRIC: The patient is somnolent, easily arousable.  Weak speech SKIN: No obvious rash, lesion, or ulcer.    LABORATORY PANEL:   CBC Recent Labs  Lab 09/20/18 0408  WBC 11.1*  HGB 12.7  HCT 39.2  PLT 145*   ------------------------------------------------------------------------------------------------------------------  Chemistries  Recent Labs  Lab 09/19/18 2023 09/20/18 0408  NA 142 143  K 4.2 4.2  CL 106 107  CO2 24 25  GLUCOSE 175* 120*  BUN 53* 58*  CREATININE 2.80* 2.91*  CALCIUM 9.3 9.0  AST 41  --   ALT 9  --   ALKPHOS 70  --   BILITOT 0.5  --    ------------------------------------------------------------------------------------------------------------------  Cardiac Enzymes No results for input(s): TROPONINI in the last 168 hours. ------------------------------------------------------------------------------------------------------------------  RADIOLOGY:  Ct Head Wo Contrast  Result Date: 09/19/2018 CLINICAL DATA:  Syncope, altered level of consciousness. EXAM: CT HEAD WITHOUT CONTRAST TECHNIQUE: Contiguous axial images were obtained from the base of the skull through the vertex without intravenous contrast. COMPARISON:  MRI 09/15/2018, CT 09/14/2018 FINDINGS: Brain: There is atrophy and chronic small vessel disease changes. No acute intracranial abnormality. Specifically, no hemorrhage, hydrocephalus, mass lesion, acute infarction,  or significant intracranial injury. Vascular: No hyperdense vessel or unexpected  calcification. Skull: No acute calvarial abnormality. Sinuses/Orbits: No acute finding Other: None IMPRESSION: Atrophy, chronic microvascular disease. No acute intracranial abnormality. Electronically Signed   By: Rolm Baptise M.D.   On: 09/19/2018 21:34   Dg Chest Portable 1 View  Result Date: 09/19/2018 CLINICAL DATA:  Cough and syncope EXAM: PORTABLE CHEST 1 VIEW COMPARISON:  September 14, 2018 FINDINGS: Examination is limited by patient positioning. The right lung apex is suboptimally evaluated. Heart size is stable from prior study. There are aortic calcifications. Bibasilar airspace opacities are noted. There are findings suspicious for small bilateral pleural effusions, right greater than left. The patient is status post prior multilevel vertebral augmentation. There is inferior subluxation of the left humerus. There are degenerative changes of the right glenohumeral joint. IMPRESSION: 1. Limited exam secondary to patient positioning as detailed above. 2. Stable cardiac silhouette. 3. Bibasilar airspace opacities favored to represent atelectasis with aspiration or an infiltrate not excluded. There are likely trace bilateral pleural effusions. 4. Inferior subluxation of the left humerus. Correlation with physical exam is recommended if there is clinical concern for frank dislocation follow-up with dedicated left shoulder radiographs is recommended. Electronically Signed   By: Constance Holster M.D.   On: 09/19/2018 20:54    EKG:   Orders placed or performed during the hospital encounter of 09/19/18   EKG 12-Lead   EKG 12-Lead    ASSESSMENT AND PLAN:   85 year old female with past medical history significant for arthritis, GERD, Parkinson's, osteoporosis, hypertension was brought in secondary to a syncopal episode.  1.  Syncope-likely vasovagal syncope -Looks like he has been having diarrhea and dehydration at home -IV fluids.  Orthostatic blood pressure changes once more stable and  monitor  2.  Elevated troponin-high-sensitivity troponin levels are stable, likely from demand ischemia.  Does not look like acute MI. -Discontinue cardiology consult.  Discontinue heparin drip -Follow-up echocardiogram  3.  Diarrhea-sent for GI panel and C. difficile.  4.  Acute renal failure-creatinine at 2.1, baseline normal.  Secondary to prerenal causes from diarrhea. -IV fluids and monitor closely  5.  Parkinson's disease-on Sinemet  6.  DVT prophylaxis-we will start subcu heparin  Physical therapy consult needed   All the records are reviewed and case discussed with Care Management/Social Workerr. Management plans discussed with the patient, family and they are in agreement.  CODE STATUS: DNR  TOTAL TIME TAKING CARE OF THIS PATIENT: 37 minutes.   POSSIBLE D/C IN 1-2 DAYS, DEPENDING ON CLINICAL CONDITION.   Gladstone Lighter M.D on 09/20/2018 at 8:57 AM  Between 7am to 6pm - Pager - [phone]  After 6pm go to [email or web address] - password [email or web address]  Office  [phone]  CC: Primary care physician; Rusty Aus, MD

## 2018-09-20 NOTE — Telephone Encounter (Signed)
Nuplazid RX started, placed in Dr. Guadelupe Sabin office for review. Pt will also need to sign form before receiving nuplazid.

## 2018-09-20 NOTE — Telephone Encounter (Signed)
It appears that pt has been admitted again to hospital.

## 2018-09-20 NOTE — Progress Notes (Signed)
Patient with multiple episodes of diarrhea since last night. Order for c.diff. and GI panels per Dr. Tressia Miners. Enteric precautions initiated while results are pending.

## 2018-09-20 NOTE — ED Notes (Signed)
Updated patient's son regarding admission and what room she is going to.

## 2018-09-20 NOTE — ED Notes (Signed)
ED TO INPATIENT HANDOFF REPORT  ED Nurse Name and Phone #:  [phone]  S Name/Age/Gender Colin RheinPeggy M Perriello 85 y.o. female Room/Bed: ED02A/ED02A  Code Status   Code Status: Prior  Home/SNF/Other Home Patient oriented to: self Is this baseline? Yes   Triage Complete: Triage complete  Chief Complaint Syncope  Triage Note Patient to [facility]2 via EMS from home.  Per EMS family reported patient with syncopal episode while having a bowel movement.  Enroute EMS noted cough.  EMS reports 1st responders reported 89% on room air up to 98% on 12 liter via non rebreather.  EMS reports they go 91% on room air that came up to 97% with 3 liter via nasal cannula.    Allergies Allergies  Allergen Reactions  . Fentanyl Anxiety and Shortness Of Breath  . Actonel [Risedronate Sodium] Other (See Comments)    Reaction unknown  . Hydrocodone Other (See Comments)    Reaction unknown  . Iodine Other (See Comments)    Unknown  . Shellfish Allergy Other (See Comments)    Reaction unknown  . Penicillins Rash and Other (See Comments)    Has patient had a PCN reaction causing immediate rash, facial/tongue/throat swelling, SOB or lightheadedness with hypotension: Yes Has patient had a PCN reaction causing severe rash involving mucus membranes or skin necrosis: No Has patient had a PCN reaction that required hospitalization: No Has patient had a PCN reaction occurring within the last 10 years: No If all of the above answers are "NO", then may proceed with Cephalosporin use.     Level of Care/Admitting Diagnosis ED Disposition    ED Disposition Condition Comment   Admit  Hospital Area: [facility] [[id number]]  Level of Care: Telemetry [5]  Covid Evaluation: Screening Protocol (No Symptoms)  Diagnosis: NSTEMI (non-ST elevated myocardial infarction) [facility](HCC) [[id number]]) [[id number]]  Admitting Physician: Oralia ManisWILLIS, DAVID [[id number]]  Attending Physician: Oralia ManisWILLIS, DAVID [phone][[id number]]  Estimated length of stay: past  midnight tomorrow  Certification:: I certify this patient will need inpatient services for at least 2 midnights  PT Class (Do Not Modify): Inpatient [101]  PT Acc Code (Do Not Modify): Private [1]       B Medical/Surgery History Past Medical History:  Diagnosis Date  . Arthritis   . Asthma   . Closed fracture of three ribs   . Closed fracture of unspecified part of neck of femur   . Closed fracture of unspecified part of vertebral column without mention of spinal cord injury   . Disturbance of salivary secretion   . Dysrhythmia   . Fall   . GERD (gastroesophageal reflux disease)   . Hypertension   . Osteoporosis, unspecified   . Pain in thoracic spine   . Paralysis agitans (HCC)   . Parkinson's disease (HCC)   . Pneumonia   . Scoliosis (and kyphoscoliosis), idiopathic   . Unspecified adverse effect of unspecified drug, medicinal and biological substance   . Unspecified disorder of plasma protein metabolism    Past Surgical History:  Procedure Laterality Date  . CATARACT EXTRACTION W/PHACO Right 09/29/2017   Procedure: CATARACT EXTRACTION PHACO AND INTRAOCULAR LENS PLACEMENT (IOC);  Surgeon: Galen ManilaPorfilio, William, MD;  Location: [facility] ORS;  Service: Ophthalmology;  Laterality: Right;  US 00:39.3 AP% 14.9 CDE 5.85 Fluid pack lot # [id number]  . HERNIA REPAIR  2002  . HIP SURGERY       A IV Location/Drains/Wounds Patient Lines/Drains/Airways Status   Active Line/Drains/Airways    Name:   Placement date:  Placement time:   Site:   Days:   Peripheral IV 09/19/18 Left Antecubital   09/19/18    2027    Antecubital   1   Peripheral IV 09/19/18 Right Wrist   09/19/18    -    Wrist   1   Peripheral IV 09/19/18 Left Hand   09/19/18    2047    Hand   1   Incision (Closed) 09/29/17 Eye Right   09/29/17    1014     356          Intake/Output Last 24 hours No intake or output data in the 24 hours ending 09/20/18 0009  Labs/Imaging Results for orders placed or performed during the  hospital encounter of 09/19/18 (from the past 48 hour(s))  CBC     Status: Abnormal   Collection Time: 09/19/18  8:23 PM  Result Value Ref Range   WBC 9.9 4.0 - 10.5 K/uL   RBC 4.19 3.87 - 5.11 MIL/uL   Hemoglobin 12.6 12.0 - 15.0 g/dL   HCT 16.138.5 09.636.0 - 04.546.0 %   MCV 91.9 80.0 - 100.0 fL   MCH 30.1 26.0 - 34.0 pg   MCHC 32.7 30.0 - 36.0 g/dL   RDW 40.915.6 (H) 81.111.5 - 91.415.5 %   Platelets 128 (L) 150 - 400 K/uL   nRBC 0.0 0.0 - 0.2 %    Comment: Performed at [facility], [address] [place], [place] [address]  Urinalysis, Complete w Microscopic     Status: Abnormal   Collection Time: 09/19/18  8:23 PM  Result Value Ref Range   Color, Urine YELLOW (A) YELLOW   APPearance CLOUDY (A) CLEAR   Specific Gravity, Urine 1.014 1.005 - 1.030   pH 5.0 5.0 - 8.0   Glucose, UA NEGATIVE NEGATIVE mg/dL   Hgb urine dipstick NEGATIVE NEGATIVE   Bilirubin Urine NEGATIVE NEGATIVE   Ketones, ur NEGATIVE NEGATIVE mg/dL   Protein, ur 621100 (A) NEGATIVE mg/dL   Nitrite NEGATIVE NEGATIVE   Leukocytes,Ua TRACE (A) NEGATIVE   RBC / HPF 0-5 0 - 5 RBC/hpf   WBC, UA 0-5 0 - 5 WBC/hpf   Bacteria, UA NONE SEEN NONE SEEN   Squamous Epithelial / LPF 0-5 0 - 5   Mucus PRESENT    Hyaline Casts, UA PRESENT     Comment: Performed at [facility], [address] [place], [place] [address]  Lactic acid, plasma     Status: None   Collection Time: 09/19/18  8:23 PM  Result Value Ref Range   Lactic Acid, Venous 1.5 0.5 - 1.9 mmol/L    Comment: Performed at [facility], [address] [place], [place] [address]  Comprehensive metabolic panel     Status: Abnormal   Collection Time: 09/19/18  8:23 PM  Result Value Ref Range   Sodium 142 135 - 145 mmol/L   Potassium 4.2 3.5 - 5.1 mmol/L   Chloride 106 98 - 111 mmol/L   CO2 24 22 - 32 mmol/L   Glucose, Bld 175 (H) 70 - 99 mg/dL   BUN 53 (H) 8 - 23 mg/dL   Creatinine, Ser 6.292.80 (H) 0.44 - 1.00 mg/dL   Calcium 9.3 8.9 - 52.810.3 mg/dL    Total Protein 5.6 (L) 6.5 - 8.1 g/dL   Albumin 2.8 (L) 3.5 - 5.0 g/dL   AST 41 15 - 41 U/L   ALT 9 0 - 44 U/L   Alkaline Phosphatase 70  38 - 126 U/L   Total Bilirubin 0.5 0.3 - 1.2 mg/dL   GFR calc non Af Amer 15 (L) >60 mL/min   GFR calc Af Amer 17 (L) >60 mL/min   Anion gap 12 5 - 15    Comment: Performed at [facility], [address], [place], [place] [address]  SARS Coronavirus 2 (CEPHEID - Performed in [facility] hospital lab), Hosp Order     Status: None   Collection Time: 09/19/18  8:23 PM   Specimen: Nasopharyngeal Swab  Result Value Ref Range   SARS Coronavirus 2 NEGATIVE NEGATIVE    Comment: (NOTE) If result is NEGATIVE SARS-CoV-2 target nucleic acids are NOT DETECTED. The SARS-CoV-2 RNA is generally detectable in upper and lower  respiratory specimens during the acute phase of infection. The lowest  concentration of SARS-CoV-2 viral copies this assay can detect is 250  copies / mL. A negative result does not preclude SARS-CoV-2 infection  and should not be used as the sole basis for treatment or other  patient management decisions.  A negative result may occur with  improper specimen collection / handling, submission of specimen other  than nasopharyngeal swab, presence of viral mutation(s) within the  areas targeted by this assay, and inadequate number of viral copies  (<250 copies / mL). A negative result must be combined with clinical  observations, patient history, and epidemiological information. If result is POSITIVE SARS-CoV-2 target nucleic acids are DETECTED. The SARS-CoV-2 RNA is generally detectable in upper and lower  respiratory specimens dur ing the acute phase of infection.  Positive  results are indicative of active infection with SARS-CoV-2.  Clinical  correlation with patient history and other diagnostic information is  necessary to determine patient infection status.  Positive results do  not rule out bacterial infection or co-infection  with other viruses. If result is PRESUMPTIVE POSTIVE SARS-CoV-2 nucleic acids MAY BE PRESENT.   A presumptive positive result was obtained on the submitted specimen  and confirmed on repeat testing.  While 2019 novel coronavirus  (SARS-CoV-2) nucleic acids may be present in the submitted sample  additional confirmatory testing may be necessary for epidemiological  and / or clinical management purposes  to differentiate between  SARS-CoV-2 and other Sarbecovirus currently known to infect humans.  If clinically indicated additional testing with an alternate test  methodology [phone]) is advised. The SARS-CoV-2 RNA is generally  detectable in upper and lower respiratory sp ecimens during the acute  phase of infection. The expected result is Negative. Fact Sheet for Patients:  BoilerBrush.com.cy Fact Sheet for Healthcare Providers: https://pope.com/ This test is not yet approved or cleared by the [place] FDA and has been authorized for detection and/or diagnosis of SARS-CoV-2 by FDA under an Emergency Use Authorization (EUA).  This EUA will remain in effect (meaning this test can be used) for the duration of the COVID-19 declaration under Section 564(b)(1) of the Act, 21 U.S.C. section 360bbb-3(b)(1), unless the authorization is terminated or revoked sooner. Performed at [facility], [address] [place], [place] [address]   Troponin I (High Sensitivity)     Status: Abnormal   Collection Time: 09/19/18  8:23 PM  Result Value Ref Range   Troponin I (High Sensitivity) 339 (HH) <18 ng/L    Comment: CRITICAL RESULT CALLED TO, READ BACK BY AND VERIFIED WITH Tywone Bembenek AT 2139 ON 09/19/18 RWW (NOTE) Elevated high sensitivity troponin I (hsTnI) values and significant  changes across serial measurements may suggest ACS  but many other  chronic and acute conditions are known to elevate hsTnI results.  Refer to the "Links"  section for chest pain algorithms and additional  guidance. Performed at [facility], [address] [place], [place] [address]   APTT     Status: Abnormal   Collection Time: 09/19/18  8:23 PM  Result Value Ref Range   aPTT 41 (H) 24 - 36 seconds    Comment:        IF BASELINE aPTT IS ELEVATED, SUGGEST PATIENT RISK ASSESSMENT BE USED TO DETERMINE APPROPRIATE ANTICOAGULANT THERAPY. Performed at [facility], [address] [place], [place] [address]   Protime-INR     Status: None   Collection Time: 09/19/18  8:23 PM  Result Value Ref Range   Prothrombin Time 12.1 11.4 - 15.2 seconds   INR 0.9 0.8 - 1.2    Comment: (NOTE) INR goal varies based on device and disease states. Performed at [facility], [place], [place] [address]    Ct Head Wo Contrast  Result Date: 09/19/2018 CLINICAL DATA:  Syncope, altered level of consciousness. EXAM: CT HEAD WITHOUT CONTRAST TECHNIQUE: Contiguous axial images were obtained from the base of the skull through the vertex without intravenous contrast. COMPARISON:  MRI 09/15/2018, CT 09/14/2018 FINDINGS: Brain: There is atrophy and chronic small vessel disease changes. No acute intracranial abnormality. Specifically, no hemorrhage, hydrocephalus, mass lesion, acute infarction, or significant intracranial injury. Vascular: No hyperdense vessel or unexpected calcification. Skull: No acute calvarial abnormality. Sinuses/Orbits: No acute finding Other: None IMPRESSION: Atrophy, chronic microvascular disease. No acute intracranial abnormality. Electronically Signed   By: Rolm Baptise M.D.   On: 09/19/2018 21:34   Dg Chest Portable 1 View  Result Date: 09/19/2018 CLINICAL DATA:  Cough and syncope EXAM: PORTABLE CHEST 1 VIEW COMPARISON:  September 14, 2018 FINDINGS: Examination is limited by patient positioning. The right lung apex is suboptimally evaluated. Heart size is stable from prior study. There are aortic  calcifications. Bibasilar airspace opacities are noted. There are findings suspicious for small bilateral pleural effusions, right greater than left. The patient is status post prior multilevel vertebral augmentation. There is inferior subluxation of the left humerus. There are degenerative changes of the right glenohumeral joint. IMPRESSION: 1. Limited exam secondary to patient positioning as detailed above. 2. Stable cardiac silhouette. 3. Bibasilar airspace opacities favored to represent atelectasis with aspiration or an infiltrate not excluded. There are likely trace bilateral pleural effusions. 4. Inferior subluxation of the left humerus. Correlation with physical exam is recommended if there is clinical concern for frank dislocation follow-up with dedicated left shoulder radiographs is recommended. Electronically Signed   By: Constance Holster M.D.   On: 09/19/2018 20:54    Pending Labs Unresulted Labs (From admission, onward)    Start     Ordered   09/20/18 0700  Heparin level (unfractionated)  Once-Timed,   STAT     09/19/18 2341   09/20/18 0500  CBC  Tomorrow morning,   STAT     09/19/18 2303   09/19/18 2029  Blood culture (routine x 2)  BLOOD CULTURE X 2,   STAT     09/19/18 2029   Signed and Held  Basic metabolic panel  Tomorrow morning,   R     Signed and Held   Signed and Held  CBC  Tomorrow morning,   R     Signed and Held   Signed and Held  Troponin I (High Sensitivity)  STAT Now then every 2 hours,   R    Question:  Indication  Answer:  Suspect ACS   Signed and Held          Vitals/Pain Today's Vitals   09/19/18 2100 09/19/18 2200 09/19/18 2300 09/20/18 0000  BP: (!) 123/59 (!) 124/55 (!) 98/49 (!) 109/52  Pulse:  85 77   Resp: 15 13 12 11   Temp:      TempSrc:      SpO2:  96% 98%   Weight:      Height:        Isolation Precautions No active isolations  Medications Medications  sodium chloride flush (NS) 0.9 % injection 3 mL (has no administration in time  range)  heparin bolus via infusion 4,000 Units (4,000 Units Intravenous Bolus from Bag 09/19/18 2309)    Followed by  heparin ADULT infusion 100 units/mL (25000 units/28050mL sodium chloride 0.45%) (800 Units/hr Intravenous Transfusing/Transfer 09/20/18 0006)    Mobility non-ambulatory     Focused Assessments Cardiac Assessment Handoff:  Cardiac Rhythm: Normal sinus rhythm No results found for: CKTOTAL, CKMB, CKMBINDEX, TROPONINI No results found for: DDIMER Does the Patient currently have chest pain? No      R Recommendations: See Admitting Provider Note  Report given to:   Additional Notes:

## 2018-09-20 NOTE — Progress Notes (Signed)
*  PRELIMINARY RESULTS* Echocardiogram 2D Echocardiogram has been performed.  Michelle Beck 09/20/2018, 10:51 AM

## 2018-09-20 NOTE — Evaluation (Signed)
Physical Therapy Evaluation Patient Details Name: Michelle Beck MRN: [id number] DOB: 1930-01-07 Today's Date: 09/20/2018   History of Present Illness  85 y.o. female with a known history of Parkinson's disease, hypertension, asthma, arthritis, GERD.  She arrived to the emergency room via EMS services for altered mental status, syncopal episode.  Clinical Impression  Patient sleeping upon arrival, wakes to my voice and light touch. Patient following direction to move extremities however is very weak and requires total assistance with all bed mobility and supine LE exercises. Patient is very weak and will benefit from continued PT to improve functional mobility and independence.     Follow Up Recommendations SNF    Equipment Recommendations       Recommendations for Other Services       Precautions / Restrictions Precautions Precautions: Fall Restrictions Weight Bearing Restrictions: No      Mobility  Bed Mobility Overal bed mobility: Needs Assistance Bed Mobility: Supine to Sit;Sit to Supine   Sidelying to sit: Total assist Supine to sit: Total assist Sit to supine: Total assist      Transfers                 General transfer comment: transfer not attempted due to level of lethargy and requiring total assist for any bed mobility  Ambulation/Gait             General Gait Details: not attempted this visit  Stairs            Wheelchair Mobility    Modified Rankin (Stroke Patients Only)       Balance Overall balance assessment: Needs assistance                                           Pertinent Vitals/Pain Pain Assessment: No/denies pain    Home Living Family/patient expects to be discharged to:: Unsure Living Arrangements: Spouse/significant other               Additional Comments: Pt apparently has w/c, walker, daughter comes and helps with some self-care ADLs    Prior Function           Comments: patient  states she was walking prior to admission, however unsure of accuracy of information     Hand Dominance        Extremity/Trunk Assessment   Upper Extremity Assessment Upper Extremity Assessment: Generalized weakness    Lower Extremity Assessment Lower Extremity Assessment: Generalized weakness       Communication      Cognition Arousal/Alertness: Lethargic Behavior During Therapy: Flat affect Overall Cognitive Status: No family/caregiver present to determine baseline cognitive functioning                                 General Comments: unsure of baseline      General Comments      Exercises Total Joint Exercises Ankle Circles/Pumps: PROM;10 reps;Both Heel Slides: PROM;10 reps;Both   Assessment/Plan    PT Assessment Patient needs continued PT services  PT Problem List Decreased strength;Decreased mobility;Decreased activity tolerance;Decreased cognition;Decreased balance       PT Treatment Interventions Therapeutic activities;Gait training;Therapeutic exercise;Functional mobility training;Patient/family education    PT Goals (Current goals can be found in the Care Plan section)  Acute Rehab PT Goals Patient Stated Goal: patient unable to state  goals PT Goal Formulation: Patient unable to participate in goal setting    Frequency Min 2X/week   Barriers to discharge        Co-evaluation               AM-PAC PT "6 Clicks" Mobility  Outcome Measure Help needed turning from your back to your side while in a flat bed without using bedrails?: Total Help needed moving from lying on your back to sitting on the side of a flat bed without using bedrails?: Total Help needed moving to and from a bed to a chair (including a wheelchair)?: Total Help needed standing up from a chair using your arms (e.g., wheelchair or bedside chair)?: Total Help needed to walk in hospital room?: Total Help needed climbing 3-5 steps with a railing? : Total 6 Click  Score: 6    End of Session   Activity Tolerance: Patient limited by lethargy Patient left: in bed Nurse Communication: Mobility status PT Visit Diagnosis: Muscle weakness (generalized) (M62.81);Difficulty in walking, not elsewhere classified (R26.2)    Time: 1400-1415 PT Time Calculation (min) (ACUTE ONLY): 15 min   Charges:   PT Evaluation $PT Eval Moderate Complexity: 1 Mod          Rilee Wendling, PT, GCS 09/20/18,2:29 PM

## 2018-09-21 ENCOUNTER — Inpatient Hospital Stay: Payer: Medicare Other

## 2018-09-21 LAB — BASIC METABOLIC PANEL
Anion gap: 11 (ref 5–15)
BUN: 75 mg/dL — ABNORMAL HIGH (ref 8–23)
CO2: 22 mmol/L (ref 22–32)
Calcium: 8.1 mg/dL — ABNORMAL LOW (ref 8.9–10.3)
Chloride: 111 mmol/L (ref 98–111)
Creatinine, Ser: 4.33 mg/dL — ABNORMAL HIGH (ref 0.44–1.00)
GFR calc Af Amer: 10 mL/min — ABNORMAL LOW (ref >60)
GFR calc non Af Amer: 9 mL/min — ABNORMAL LOW (ref >60)
Glucose, Bld: 112 mg/dL — ABNORMAL HIGH (ref 70–99)
Potassium: 4.5 mmol/L (ref 3.5–5.1)
Sodium: 144 mmol/L (ref 135–145)

## 2018-09-21 LAB — GLUCOSE, CAPILLARY
Glucose-Capillary: 114 mg/dL — ABNORMAL HIGH (ref 70–99)
Glucose-Capillary: 115 mg/dL — ABNORMAL HIGH (ref 70–99)
Glucose-Capillary: 102 mg/dL — ABNORMAL HIGH (ref 70–99)
Glucose-Capillary: 123 mg/dL — ABNORMAL HIGH (ref 70–99)

## 2018-09-21 LAB — TROPONIN I (HIGH SENSITIVITY): Troponin I (High Sensitivity): 442 ng/L (ref <18)

## 2018-09-21 LAB — VITAMIN B1: Vitamin B1 (Thiamine): 93.3 nmol/L (ref 66.5–200.0)

## 2018-09-21 MED ORDER — INSULIN ASPART 100 UNIT/ML ~~LOC~~ SOLN
0.0000 [IU] | Freq: Three times a day (TID) | SUBCUTANEOUS | Status: DC
  Administered 2018-09-23: 3 [IU] via SUBCUTANEOUS
  Administered 2018-09-23 – 2018-09-27 (×7): 1 [IU] via SUBCUTANEOUS
  Filled 2018-09-21 (×8): qty 1

## 2018-09-21 MED ORDER — INSULIN ASPART 100 UNIT/ML ~~LOC~~ SOLN
0.0000 [IU] | Freq: Every day | SUBCUTANEOUS | Status: DC
  Administered 2018-09-22: 2 [IU] via SUBCUTANEOUS
  Filled 2018-09-21: qty 1

## 2018-09-21 MED ORDER — RISAQUAD PO CAPS
1.0000 | ORAL_CAPSULE | Freq: Every day | ORAL | Status: DC
  Administered 2018-09-21 – 2018-09-27 (×7): 1 via ORAL
  Filled 2018-09-21 (×7): qty 1

## 2018-09-21 NOTE — Progress Notes (Signed)
[employer] - [place] at [facility]   PATIENT NAME: Michelle Beck    MR#:  [id number]  DATE OF BIRTH:  02/17/1930  SUBJECTIVE:  CHIEF COMPLAINT:   Chief Complaint  Patient presents with   Loss of Consciousness   -Alert today and answering some simple questions.  Speech is not understandable -Some improvement in diarrhea noted.  Labs showing acute renal failure  REVIEW OF SYSTEMS:  Review of Systems  Unable to perform ROS: Mental status change    DRUG ALLERGIES:   Allergies  Allergen Reactions   Fentanyl Anxiety and Shortness Of Breath   Actonel [Risedronate Sodium] Other (See Comments)    Reaction unknown   Hydrocodone Other (See Comments)    Reaction unknown   Iodine Other (See Comments)    Unknown   Shellfish Allergy Other (See Comments)    Reaction unknown   Penicillins Rash and Other (See Comments)    Has patient had a PCN reaction causing immediate rash, facial/tongue/throat swelling, SOB or lightheadedness with hypotension: Yes Has patient had a PCN reaction causing severe rash involving mucus membranes or skin necrosis: No Has patient had a PCN reaction that required hospitalization: No Has patient had a PCN reaction occurring within the last 10 years: No If all of the above answers are "NO", then may proceed with Cephalosporin use.     VITALS:  Blood pressure (!) 116/51, pulse (!) 103, temperature 98.7 F (37.1 C), temperature source Oral, resp. rate 20, height 5\' 4"  (1.626 m), weight 66.3 kg, SpO2 95 %.  PHYSICAL EXAMINATION:  Physical Exam  GENERAL:  85 y.o.-year-old elderly patient lying in the bed with no acute distress.  EYES: Pupils equal, round, reactive to light and accommodation. No scleral icterus. Extraocular muscles intact.  HEENT: Head atraumatic, normocephalic. Oropharynx and nasopharynx clear.  NECK:  Supple, no jugular venous distention. No thyroid enlargement, no tenderness.  LUNGS: Normal breath sounds  bilaterally, no wheezing, rales,rhonchi or crepitation. No use of accessory muscles of respiration.  Decreased bibasilar breath sounds. CARDIOVASCULAR: S1, S2 normal. No  rubs, or gallops.  3/6 systolic murmur is present ABDOMEN: Soft, nontender, nondistended. Bowel sounds present. No organomegaly or mass.  EXTREMITIES: No pedal edema, cyanosis, or clubbing.  NEUROLOGIC: Alert and able to answer some simple questions.  Not following commands PSYCHIATRIC: The patient is ill-appearing.  Alert and following some simple commands SKIN: No obvious rash, lesion, or ulcer.    LABORATORY PANEL:   CBC Recent Labs  Lab 09/20/18 0408  WBC 11.1*  HGB 12.7  HCT 39.2  PLT 145*   ------------------------------------------------------------------------------------------------------------------  Chemistries  Recent Labs  Lab 09/19/18 2023  09/21/18 0512  NA 142   < > 144  K 4.2   < > 4.5  CL 106   < > 111  CO2 24   < > 22  GLUCOSE 175*   < > 112*  BUN 53*   < > 75*  CREATININE 2.80*   < > 4.33*  CALCIUM 9.3   < > 8.1*  AST 41  --   --   ALT 9  --   --   ALKPHOS 70  --   --   BILITOT 0.5  --   --    < > = values in this interval not displayed.   ------------------------------------------------------------------------------------------------------------------  Cardiac Enzymes No results for input(s): TROPONINI in the last 168 hours. ------------------------------------------------------------------------------------------------------------------  RADIOLOGY:  Ct Head Wo Contrast  Result Date: 09/19/2018 CLINICAL DATA:  Syncope, altered level of consciousness. EXAM: CT HEAD WITHOUT CONTRAST TECHNIQUE: Contiguous axial images were obtained from the base of the skull through the vertex without intravenous contrast. COMPARISON:  MRI 09/15/2018, CT 09/14/2018 FINDINGS: Brain: There is atrophy and chronic small vessel disease changes. No acute intracranial abnormality. Specifically, no  hemorrhage, hydrocephalus, mass lesion, acute infarction, or significant intracranial injury. Vascular: No hyperdense vessel or unexpected calcification. Skull: No acute calvarial abnormality. Sinuses/Orbits: No acute finding Other: None IMPRESSION: Atrophy, chronic microvascular disease. No acute intracranial abnormality. Electronically Signed   By: Rolm Baptise M.D.   On: 09/19/2018 21:34   Dg Chest Portable 1 View  Result Date: 09/19/2018 CLINICAL DATA:  Cough and syncope EXAM: PORTABLE CHEST 1 VIEW COMPARISON:  September 14, 2018 FINDINGS: Examination is limited by patient positioning. The right lung apex is suboptimally evaluated. Heart size is stable from prior study. There are aortic calcifications. Bibasilar airspace opacities are noted. There are findings suspicious for small bilateral pleural effusions, right greater than left. The patient is status post prior multilevel vertebral augmentation. There is inferior subluxation of the left humerus. There are degenerative changes of the right glenohumeral joint. IMPRESSION: 1. Limited exam secondary to patient positioning as detailed above. 2. Stable cardiac silhouette. 3. Bibasilar airspace opacities favored to represent atelectasis with aspiration or an infiltrate not excluded. There are likely trace bilateral pleural effusions. 4. Inferior subluxation of the left humerus. Correlation with physical exam is recommended if there is clinical concern for frank dislocation follow-up with dedicated left shoulder radiographs is recommended. Electronically Signed   By: Constance Holster M.D.   On: 09/19/2018 20:54    EKG:   Orders placed or performed during the hospital encounter of 09/19/18   EKG 12-Lead   EKG 12-Lead    ASSESSMENT AND PLAN:   85 year old female with past medical history significant for arthritis, GERD, Parkinson's, osteoporosis, hypertension was brought in secondary to a syncopal episode.  1.  Acute renal failure-likely prerenal on  admission due to diarrhea.  No ATN possibly. -Avoid nephrotoxins.  Creatinine is worsening.  Check bladder scan and maintain strict input and output record -Renal ultrasound. Continue IV fluids.  Nephrology has been consulted  2.  Elevated troponin-high-sensitivity troponin levels are stable, likely from demand ischemia.  Does not look like acute MI. -Follow-up echocardiogram  3.  Acute C. difficile colitis-on oral vancomycin.  Also added probiotics.  Improvement in diarrhea  4.  Syncope and admission-secondary to vasovagal causes.  IV fluids at this time  5.  Parkinson's disease-on Sinemet  6.  DVT prophylaxis- subcu heparin  Overall poor prognosis due to gradual decline lately.  Palliative care has been consulted   All the records are reviewed and case discussed with Care Management/Social Workerr. Management plans discussed with the patient, family and they are in agreement.  CODE STATUS: DNR  TOTAL TIME TAKING CARE OF THIS PATIENT: 37 minutes.   POSSIBLE D/C IN 1-2 DAYS, DEPENDING ON CLINICAL CONDITION.   Gladstone Lighter M.D on 09/21/2018 at 9:12 AM  Between 7am to 6pm - Pager - [phone]  After 6pm go to [email or web address] - password [email or web address] [facility]  Office  [phone]  CC: Primary care physician; Rusty Aus, MD

## 2018-09-21 NOTE — Progress Notes (Signed)
Updated husband over the phone- discussed about worsening kidney function. Plan to continue fluids to see if kidney function improves- no imminent need for dialysis. Michelle Beck dose not want to be too aggressive given the overall clinical picture here Will monitor for now

## 2018-09-21 NOTE — Progress Notes (Addendum)
Patient bladder scan showing approx. 390 mls of urine, pt. In/out cathed with approx 390 mls of tea colored pungent urine this afternoon. She has been alert to self but disoriented to place, time, and situation, her p.o. intake has been poor today and her ability to swallow even with strong verbal cues is diminished today. Palliative notified, and spoke with husband, plan to have him come tomorrow assess for patient plan of care.   Addendum: During evening med pass, patient was able to tolerate her medications crushed in applesauce, she also was adamant about feeding herself, which she was able to do with minimal assistance and some encouragement for swallowing.

## 2018-09-21 NOTE — Progress Notes (Signed)
Daily Progress Note   Patient Name: Michelle Beck       Date: 09/21/2018 DOB: Sep 08, 1929  Age: 85 y.o. MRN#: [id number] Attending Physician: Gladstone Lighter, MD Primary Care Physician: Rusty Aus, MD Admit Date: 09/19/2018  Reason for Consultation/Follow-up: Establishing goals of care  Subjective: Patient is resting in bed. Most of her speech is unintelligible. What is able to be understood is confused as she is unable to answer questions. Spoke with husband. He states he has been updated by the primary team and believes his wife may need dialysis soon. Per staff, she is not eating and drinking well. Question of QOL and GOC for her. He states he has been speaking with his children. Plans for husband to meet me at bedside at 12:00 tomorrow for [facility] with children on phone.    Length of Stay: 2  Current Medications: Scheduled Meds:  . acidophilus  1 capsule Oral Daily  . Carbidopa-Levodopa ER  1 tablet Oral QID  . heparin injection (subcutaneous)  5,000 Units Subcutaneous Q8H  . insulin aspart  0-5 Units Subcutaneous QHS  . insulin aspart  0-9 Units Subcutaneous TID WC  . metoprolol tartrate  12.5 mg Oral Daily  . rOPINIRole  2 mg Oral TID  . sodium chloride flush  3 mL Intravenous Once  . vancomycin  125 mg Oral Q6H    Continuous Infusions: . sodium chloride 75 mL/hr at 09/21/18 1243    PRN Meds: acetaminophen **OR** acetaminophen, ondansetron **OR** ondansetron (ZOFRAN) IV, risperiDONE  Physical Exam HENT:     Head: Normocephalic.  Pulmonary:     Effort: Pulmonary effort is normal.  Neurological:     Mental Status: She is alert.             Vital Signs: BP (!) 116/51 (BP Location: Right Arm)   Pulse (!) 103   Temp 98.7 F (37.1 C) (Oral)   Resp 20   Ht 5\' 4"   (1.626 m)   Wt 66.3 kg   SpO2 95%   BMI 25.10 kg/m  SpO2: SpO2: 95 % O2 Device: O2 Device: Room Air O2 Flow Rate: O2 Flow Rate (L/min): 2 L/min  Intake/output summary:   Intake/Output Summary (Last 24 hours) at 09/21/2018 1439 Last data filed at 09/21/2018 0313 Gross per 24 hour  Intake 520 ml  Output 0 ml  Net 520 ml   LBM: Last BM Date: 09/21/18 Baseline Weight: Weight: 72.6 kg Most recent weight: Weight: 66.3 kg       Palliative Assessment/Data:      Patient Active Problem List   Diagnosis Date Noted  . Pressure injury of skin 09/20/2018  . HTN (hypertension) 09/19/2018  . GERD (gastroesophageal reflux disease) 09/19/2018  . NSTEMI (non-ST elevated myocardial infarction) (HCC) 09/19/2018  . Goals of care, counseling/discussion   . Palliative care by specialist   . DNR (do not resuscitate) discussion   . AMS (altered mental status) 09/14/2018  . Parkinson's disease (HCC) 08/24/2012    Palliative Care Assessment & Plan    Assessment: Creatinine is continuing to rise. Poor oral intake. C-diff.   Recommendations/Plan:  Plans for GOC conversation with husband tomorrow at 12:00 at bedside.   Code Status:    Code Status Orders  (From admission, onward)         Start     Ordered   09/20/18 0207  Do not attempt resuscitation (DNR)  Continuous    Question Answer Comment  In the event of cardiac or respiratory ARREST Do not call a "code blue"   In the event of cardiac or respiratory ARREST Do not perform Intubation, CPR, defibrillation or ACLS   In the event of cardiac or respiratory ARREST Use medication by any route, position, wound care, and other measures to relive pain and suffering. May use oxygen, suction and manual treatment of airway obstruction as needed for comfort.      09/20/18 0206        Code Status History    Date Active Date Inactive Code Status Order ID Comments User Context   09/15/2018 1523 09/16/2018 2106 DNR [id number]  Katheran Aweove, Tasha A,  NP Inpatient   09/14/2018 1853 09/15/2018 1523 Full Code [id number]  Pearletha AlfredSeals, Angela H, NP ED   Advance Care Planning Activity    Advance Directive Documentation     Most Recent Value  Type of Advance Directive  Healthcare Power of Attorney, Living will  Pre-existing out of facility DNR order (yellow form or pink MOST form)  -  "MOST" Form in Place?  -       Prognosis:  Poor  Discharge Planning:  To Be Determined  Care plan was discussed with RN  Thank you for allowing the Palliative Medicine Team to assist in the care of this patient.   Total Time 25 min Prolonged Time Billed  no       Greater than 50%  of this time was spent counseling and coordinating care related to the above assessment and plan.  Morton Stallrystal Mehtab Dolberry, NP  Please contact Palliative Medicine Team phone at [phone] for questions and concerns.

## 2018-09-21 NOTE — Progress Notes (Signed)
PT Cancellation Note  Patient Details Name: BENAY POMEROY MRN: [id number] DOB: 12-31-1929   Cancelled Treatment:    Reason Eval/Treat Not Completed: Patient at procedure or test/unavailable.  Pt currently out of room.  Will attempt again today if time allows.  Roxanne Gates, PT, DPT  Roxanne Gates 09/21/2018, 10:50 AM

## 2018-09-22 LAB — CBC
HCT: 33.5 % — ABNORMAL LOW (ref 36.0–46.0)
Hemoglobin: 10.6 g/dL — ABNORMAL LOW (ref 12.0–15.0)
MCH: 29.6 pg (ref 26.0–34.0)
MCHC: 31.6 g/dL (ref 30.0–36.0)
MCV: 93.6 fL (ref 80.0–100.0)
Platelets: 182 10*3/uL (ref 150–400)
RBC: 3.58 MIL/uL — ABNORMAL LOW (ref 3.87–5.11)
RDW: 15.7 % — ABNORMAL HIGH (ref 11.5–15.5)
WBC: 7.1 10*3/uL (ref 4.0–10.5)
nRBC: 0 % (ref 0.0–0.2)

## 2018-09-22 LAB — GLUCOSE, CAPILLARY
Glucose-Capillary: 91 mg/dL (ref 70–99)
Glucose-Capillary: 106 mg/dL — ABNORMAL HIGH (ref 70–99)
Glucose-Capillary: 111 mg/dL — ABNORMAL HIGH (ref 70–99)

## 2018-09-22 LAB — BASIC METABOLIC PANEL
Anion gap: 12 (ref 5–15)
BUN: 84 mg/dL — ABNORMAL HIGH (ref 8–23)
CO2: 20 mmol/L — ABNORMAL LOW (ref 22–32)
Calcium: 8 mg/dL — ABNORMAL LOW (ref 8.9–10.3)
Chloride: 118 mmol/L — ABNORMAL HIGH (ref 98–111)
Creatinine, Ser: 3.5 mg/dL — ABNORMAL HIGH (ref 0.44–1.00)
GFR calc Af Amer: 13 mL/min — ABNORMAL LOW (ref >60)
GFR calc non Af Amer: 11 mL/min — ABNORMAL LOW (ref >60)
Glucose, Bld: 95 mg/dL (ref 70–99)
Potassium: 4.3 mmol/L (ref 3.5–5.1)
Sodium: 150 mmol/L — ABNORMAL HIGH (ref 135–145)

## 2018-09-22 MED ORDER — SODIUM CHLORIDE 0.9% FLUSH
3.0000 mL | Freq: Two times a day (BID) | INTRAVENOUS | Status: DC
  Administered 2018-09-22 – 2018-09-27 (×8): 3 mL via INTRAVENOUS

## 2018-09-22 MED ORDER — SODIUM CHLORIDE 0.45 % IV SOLN
INTRAVENOUS | Status: DC
  Administered 2018-09-22: 09:00:00 via INTRAVENOUS

## 2018-09-22 MED ORDER — SODIUM CHLORIDE 0.9% FLUSH
3.0000 mL | Freq: Two times a day (BID) | INTRAVENOUS | Status: DC
  Administered 2018-09-22 (×2): 3 mL via INTRAVENOUS

## 2018-09-22 MED ORDER — DEXTROSE 5 % IV SOLN
INTRAVENOUS | Status: DC
  Administered 2018-09-22 – 2018-09-24 (×4): via INTRAVENOUS

## 2018-09-22 MED ORDER — SODIUM CHLORIDE 0.9% FLUSH: 3.0000 mL | INTRAVENOUS | Status: DC | PRN

## 2018-09-22 NOTE — TOC Progression Note (Signed)
Transition of Care [facility]) - Progression Note    Patient Details  Name: SHAKAYA BHULLAR MRN: [id number] Date of Birth: [birth year]  Transition of Care [facility]) CM/SW Contact  Katrina Stack, RN Phone Number: 09/22/2018, 2:55 PM  Clinical Narrative:   Palliative met with family today. CM  Spoke with Mr Elders regarding physical therapy recommendation of skilled nursing facility. Mr Kearse stated that his wife is not going to a nursing home. If patient discharges home he says that there is plenty of family support to provide total care.  Would need a hospital bed.  It is anticipated that disposition will be with hospice plan of care. Per palliative, going home with hospice may be complicated by patient's current C Diff as relates to her caregivers. It is felt she meets criteria for Hospice Home. Family will discuss this among themselves tonight and decide whether to take patient home or go to hospice home.        Expected Discharge Plan and Services                                                 Social Determinants of Health (SDOH) Interventions    Readmission Risk Interventions No flowsheet data found.

## 2018-09-22 NOTE — Progress Notes (Signed)
Spoke to son, Merry Proud, with update on his mother.

## 2018-09-22 NOTE — Progress Notes (Signed)
PT Cancellation Note  Patient Details Name: Michelle Beck MRN: [id number] DOB: [birth year]   Cancelled Treatment:    Reason Eval/Treat Not Completed: Medical issues which prohibited therapy(Per chart, patient/family considering transition to hospice care (facility vs home).  Planning to meet overnight to discuss as a family and reach a decision by tomorrow.  Will hold therapy services at this time pending clarification of goals of care.  Will continue to follow and intervene as medically appropriate.)   Doreen Garretson H. Owens Shark, PT, DPT, NCS 09/22/18, 3:11 PM [phone]

## 2018-09-22 NOTE — Progress Notes (Signed)
[place] at [place] NAME: Michelle Beck    MR#:  [id number]  [place]:  Jun 08, 1929  SUBJECTIVE:  CHIEF COMPLAINT:   Chief Complaint  Patient presents with  . Loss of Consciousness   -Remains alert and answering some simple questions. -Diarrhea is improved.  Renal function slowly improving  REVIEW OF SYSTEMS:  Review of Systems  Constitutional: Positive for malaise/fatigue. Negative for chills and fever.  HENT: Positive for hearing loss. Negative for congestion, ear discharge and nosebleeds.   Eyes: Negative for blurred vision and double vision.  Respiratory: Negative for cough, shortness of breath and wheezing.   Cardiovascular: Negative for chest pain and palpitations.  Gastrointestinal: Positive for abdominal pain and diarrhea. Negative for constipation, nausea and vomiting.  Genitourinary: Negative for dysuria.  Musculoskeletal: Negative for myalgias.  Neurological: Negative for dizziness, focal weakness, seizures, weakness and headaches.  Psychiatric/Behavioral: Negative for depression.    DRUG ALLERGIES:   Allergies  Allergen Reactions  . Fentanyl Anxiety and Shortness Of Breath  . Actonel [Risedronate Sodium] Other (See Comments)    Reaction unknown  . Hydrocodone Other (See Comments)    Reaction unknown  . Iodine Other (See Comments)    Unknown  . Shellfish Allergy Other (See Comments)    Reaction unknown  . Penicillins Rash and Other (See Comments)    Has patient had a PCN reaction causing immediate rash, facial/tongue/throat swelling, SOB or lightheadedness with hypotension: Yes Has patient had a PCN reaction causing severe rash involving mucus membranes or skin necrosis: No Has patient had a PCN reaction that required hospitalization: No Has patient had a PCN reaction occurring within the last 10 years: No If all of the above answers are "NO", then may proceed with Cephalosporin use.     VITALS:  Blood  pressure (!) 142/64, pulse 81, temperature (!) 97.4 F (36.3 C), temperature source Axillary, resp. rate 16, height 5\' 4"  (1.626 m), weight 66.3 kg, SpO2 99 %.  PHYSICAL EXAMINATION:  Physical Exam  GENERAL:  85 y.o.-year-old elderly patient lying in the bed with no acute distress.  EYES: Pupils equal, round, reactive to light and accommodation. No scleral icterus. Extraocular muscles intact.  HEENT: Head atraumatic, normocephalic. Oropharynx and nasopharynx clear.  NECK:  Supple, no jugular venous distention. No thyroid enlargement, no tenderness.  LUNGS: Normal breath sounds bilaterally, no wheezing, rales,rhonchi or crepitation. No use of accessory muscles of respiration.  Decreased bibasilar breath sounds. CARDIOVASCULAR: S1, S2 normal. No  rubs, or gallops.  3/6 systolic murmur is present ABDOMEN: Soft, nontender, nondistended. Bowel sounds present. No organomegaly or mass.  EXTREMITIES: No pedal edema, cyanosis, or clubbing.  NEUROLOGIC: Alert and able to answer some simple questions.  Not following commands PSYCHIATRIC: The patient is ill-appearing.  Alert and following some simple commands SKIN: No obvious rash, lesion, or ulcer.    LABORATORY PANEL:   CBC Recent Labs  Lab 09/22/18 0415  WBC 7.1  HGB 10.6*  HCT 33.5*  PLT 182   ------------------------------------------------------------------------------------------------------------------  Chemistries  Recent Labs  Lab 09/19/18 2023  09/22/18 0415  NA 142   < > 150*  K 4.2   < > 4.3  CL 106   < > 118*  CO2 24   < > 20*  GLUCOSE 175*   < > 95  BUN 53*   < > 84*  CREATININE 2.80*   < > 3.50*  CALCIUM 9.3   < > 8.0*  AST  41  --   --   ALT 9  --   --   ALKPHOS 70  --   --   BILITOT 0.5  --   --    < > = values in this interval not displayed.   ------------------------------------------------------------------------------------------------------------------  Cardiac Enzymes No results for input(s): TROPONINI  in the last 168 hours. ------------------------------------------------------------------------------------------------------------------  RADIOLOGY:  [place] Renal  Result Date: 09/21/2018 CLINICAL DATA:  Acute renal failure. EXAM: RENAL / URINARY TRACT ULTRASOUND COMPLETE COMPARISON:  None. FINDINGS: Right Kidney: Renal measurements: 11.1 x 4.0 x 3.7 cm = volume: 89 mL . Echogenicity within normal limits. No mass or hydronephrosis visualized. Left Kidney: Renal measurements: 10.1 x 5.0 x 3.9 cm = volume: 102.3 mL. Echogenicity within normal limits. No mass or hydronephrosis visualized. Bladder: Appears normal for degree of bladder distention. IMPRESSION: No cause for acute renal failure identified. Electronically Signed   By: Gerome Samavid  Williams III M.D   On: 09/21/2018 11:05    EKG:   Orders placed or performed during the hospital encounter of 09/19/18  . EKG 12-Lead  . EKG 12-Lead    ASSESSMENT AND PLAN:   85 year old female with past medical history significant for arthritis, GERD, Parkinson's, osteoporosis, hypertension was brought in secondary to a syncopal episode.  1.  Acute renal failure-likely prerenal on admission due to diarrhea.  Now ATN possibly. -Avoid nephrotoxins.  Creatinine is improving some today. -Continues to retain urine.  Required in and out catheterization once yesterday.  Will place a Foley catheter if needed  - and maintain strict input and output record -Renal ultrasound is completely normal. Continue IV fluids.  Nephrology has been consulted  2.  Elevated troponin-high-sensitivity troponin levels are stable, likely from demand ischemia.  Does not look like acute MI. - echocardiogram is normal, EF 55%, has LVH.  No significant wall motion abnormalities  3.  Acute C. difficile colitis-on oral vancomycin.  Also added probiotics.  Improvement in diarrhea  4.  Hypernatremia-change fluids to half-normal saline  5.  Parkinson's disease-on Sinemet  6.  DVT  prophylaxis- subcu heparin  7.  Malnutrition-extremely poor oral intake.  Family discussion with palliative care for further goals of care  Appreciate palliative care input.   All the records are reviewed and case discussed with Care Management/Social Workerr. Management plans discussed with the patient, family and they are in agreement.  CODE STATUS: DNR  TOTAL TIME TAKING CARE OF THIS PATIENT: 37 minutes.   POSSIBLE D/C IN 1-2 DAYS, DEPENDING ON CLINICAL CONDITION.   Enid Baasadhika Zendaya Groseclose M.D on 09/22/2018 at 2:53 PM  Between 7am to 6pm - Pager - [phone]  After 6pm go to [email or web address] - Social research officer, governmentpassword [email or web address]  [facility] [place] [facility]  Office  [phone]  CC: Primary care physician; Danella PentonMiller, Mark F, MD

## 2018-09-22 NOTE — Progress Notes (Signed)
Pt has not voided all evening. Bladder scan showed 216 ml. Notified MD. Made MD aware. No new orders at this time.

## 2018-09-22 NOTE — Consult Note (Signed)
[facility] CONSULT NOTE    Date: 09/22/2018                  Patient Name:  Michelle Beck  MRN: [id number]  DOB: 1929/07/17  Age / Sex: 85 y.o., female         PCP: Danella Penton, MD                 Service Requesting Consult:  Hospitalist                  Reason for Consult:  Acute renal failure            History of Present Illness: Patient is a 85 y.o. female with a PMHx of osteoarthritis, GERD, hypertension, osteoporosis, Parkinson's, prior history of pneumonia, who was admitted to [facility] on 09/19/2018 for evaluation of loss of consciousness.  Patient has dementia and unable to offer any history.  We are asked to see her for evaluation management of acute renal failure.  Her baseline creatinine appears to be 0.85 on September 15, 2018.  During this hospitalization her creatinine peaked at 4.33.  Today it is down to 3.50.  However she has also developed hypernatremia with a serum sodium of 150.  Patient also developed elevated troponin.  Urine output was 780 cc over the preceding 24 hours.  Palliative care is also on the case.    Medications: Outpatient medications: Medications Prior to Admission  Medication Sig Dispense Refill Last Dose  . Carbidopa-Levodopa ER (SINEMET CR) 25-100 MG tablet controlled release Take 1 tablet by mouth 4 (four) times daily. 9:30 AM, 1:30 PM, 5:30 PM, 9:30 PM 360 tablet 1 09/19/2018 at 0930  . Cholecalciferol 1000 units capsule Take 1,000 Units by mouth daily.    09/19/2018 at 0930  . denosumab (PROLIA) 60 MG/ML SOLN injection Inject 60 mg into the skin every 6 (six) months. Administer in upper arm, thigh, or abdomen   unknown at unknown  . docusate sodium (COLACE) 50 MG capsule Take 50 mg by mouth daily.    Past Week at Unknown time  . ibuprofen (ADVIL,MOTRIN) 200 MG tablet Take 1 tablet (200 mg total) by mouth every 8 (eight) hours as needed for moderate pain (with food). 20 tablet 0 prn at prn  . metoprolol tartrate (LOPRESSOR) 25 MG  tablet Take 12.5 mg by mouth daily.    09/19/2018 at 0930  . Polyvinyl Alcohol-Povidone (REFRESH OP) Place 1 drop into both eyes 2 (two) times daily as needed (for dry eyes).   prn at prn  . risperiDONE (RISPERDAL) 1 MG tablet Take 1 tablet (1 mg total) by mouth at bedtime as needed (agitaiton delirium). 60 tablet 2 09/18/2018 at 2200  . rOPINIRole (REQUIP) 2 MG tablet Take 1 tablet (2 mg total) by mouth 3 (three) times daily. 270 tablet 0 09/19/2018 at 0930    Current medications: Current Facility-Administered Medications  Medication Dose Route Frequency Provider Last Rate Last Dose  . 0.45 % sodium chloride infusion   Intravenous Continuous Enid Baas, MD 75 mL/hr at 09/22/18 0901    . acetaminophen (TYLENOL) tablet 650 mg  650 mg Oral Q6H PRN Oralia Manis, MD       Or  . acetaminophen (TYLENOL) suppository 650 mg  650 mg Rectal Q6H PRN Oralia Manis, MD      . acidophilus (RISAQUAD) capsule 1 capsule  1 capsule Oral Daily Enid Baas, MD   1 capsule at 09/22/18 0904  .  Carbidopa-Levodopa ER (SINEMET CR) 25-100 MG tablet controlled release 1 tablet  1 tablet Oral QID Oralia ManisWillis, David, MD   1 tablet at 09/22/18 1332  . heparin injection 5,000 Units  5,000 Units Subcutaneous Q8H Enid BaasKalisetti, Radhika, MD   5,000 Units at 09/22/18 1333  . insulin aspart (novoLOG) injection 0-5 Units  0-5 Units Subcutaneous QHS Enid BaasKalisetti, Radhika, MD      . insulin aspart (novoLOG) injection 0-9 Units  0-9 Units Subcutaneous TID WC Enid BaasKalisetti, Radhika, MD      . metoprolol tartrate (LOPRESSOR) tablet 12.5 mg  12.5 mg Oral Daily Oralia ManisWillis, David, MD   12.5 mg at 09/22/18 0905  . ondansetron (ZOFRAN) tablet 4 mg  4 mg Oral Q6H PRN Oralia ManisWillis, David, MD       Or  . ondansetron [facility](ZOFRAN) injection 4 mg  4 mg Intravenous Q6H PRN Oralia ManisWillis, David, MD      . risperiDONE (RISPERDAL) tablet 0.25 mg  0.25 mg Oral Q6H PRN Enid BaasKalisetti, Radhika, MD      . rOPINIRole (REQUIP) tablet 2 mg  2 mg Oral TID Oralia ManisWillis, David, MD   2 mg at  09/22/18 0904  . sodium chloride flush (NS) 0.9 % injection 3 mL  3 mL Intravenous Q12H Enid BaasKalisetti, Radhika, MD   3 mL at 09/22/18 0917  . sodium chloride flush (NS) 0.9 % injection 3 mL  3 mL Intravenous PRN Enid BaasKalisetti, Radhika, MD      . sodium chloride flush (NS) 0.9 % injection 3 mL  3 mL Intravenous Q12H Enid BaasKalisetti, Radhika, MD      . vancomycin (VANCOCIN) 50 mg/mL oral solution 125 mg  125 mg Oral Q6H Enid BaasKalisetti, Radhika, MD   125 mg at 09/22/18 1332      Allergies: Allergies  Allergen Reactions  . Fentanyl Anxiety and Shortness Of Breath  . Actonel [Risedronate Sodium] Other (See Comments)    Reaction unknown  . Hydrocodone Other (See Comments)    Reaction unknown  . Iodine Other (See Comments)    Unknown  . Shellfish Allergy Other (See Comments)    Reaction unknown  . Penicillins Rash and Other (See Comments)    Has patient had a PCN reaction causing immediate rash, facial/tongue/throat swelling, SOB or lightheadedness with hypotension: Yes Has patient had a PCN reaction causing severe rash involving mucus membranes or skin necrosis: No Has patient had a PCN reaction that required hospitalization: No Has patient had a PCN reaction occurring within the last 10 years: No If all of the above answers are "NO", then may proceed with Cephalosporin use.       Past Medical History: Past Medical History:  Diagnosis Date  . Arthritis   . Asthma   . Closed fracture of three ribs   . Closed fracture of unspecified part of neck of femur   . Closed fracture of unspecified part of vertebral column without mention of spinal cord injury   . Disturbance of salivary secretion   . Dysrhythmia   . Fall   . GERD (gastroesophageal reflux disease)   . Hypertension   . Osteoporosis, unspecified   . Pain in thoracic spine   . Paralysis agitans (HCC)   . Parkinson's disease (HCC)   . Pneumonia   . Scoliosis (and kyphoscoliosis), idiopathic   . Unspecified adverse effect of unspecified drug,  medicinal and biological substance   . Unspecified disorder of plasma protein metabolism      Past Surgical History: Past Surgical History:  Procedure Laterality Date  . CATARACT  EXTRACTION W/PHACO Right 09/29/2017   Procedure: CATARACT EXTRACTION PHACO AND INTRAOCULAR LENS PLACEMENT (IOC);  Surgeon: Birder Robson, MD;  Location: [facility] ORS;  Service: Ophthalmology;  Laterality: Right;  [place] 00:39.3 AP% 14.9 CDE 5.85 Fluid pack lot # [id number]  . HERNIA REPAIR  2002  . HIP SURGERY       Family History: Family History  Problem Relation Age of Onset  . Heart failure Mother   . Heart disease Mother   . Emphysema Father   . COPD Father   . Diabetes Sister      Social History: Social History   Socioeconomic History  . Marital status: Married    Spouse name: Not on file  . Number of children: Not on file  . Years of education: Not on file  . Highest education level: Not on file  Occupational History    Comment: retired  Scientific laboratory technician  . Financial resource strain: Not on file  . Food insecurity    Worry: Not on file    Inability: Not on file  . Transportation needs    Medical: Not on file    Non-medical: Not on file  Tobacco Use  . Smoking status: Never Smoker  . Smokeless tobacco: Never Used  Substance and Sexual Activity  . Alcohol use: No    Alcohol/week: 0.0 standard drinks  . Drug use: No  . Sexual activity: Not on file  Lifestyle  . Physical activity    Days per week: Not on file    Minutes per session: Not on file  . Stress: Not on file  Relationships  . Social Herbalist on phone: Not on file    Gets together: Not on file    Attends religious service: Not on file    Active member of club or organization: Not on file    Attends meetings of clubs or organizations: Not on file    Relationship status: Not on file  . Intimate partner violence    Fear of current or ex partner: Not on file    Emotionally abused: Not on file    Physically abused:  Not on file    Forced sexual activity: Not on file  Other Topics Concern  . Not on file  Social History Narrative   Lives at home w/ her husband     Review of Systems: Patient unable to offer given altered mental status  Vital Signs: Blood pressure (!) 142/64, pulse 81, temperature (!) 97.4 F (36.3 C), temperature source Axillary, resp. rate 16, height 5\' 4"  (1.626 m), weight 66.3 kg, SpO2 99 %.  Weight trends: Filed Weights   09/19/18 2009 09/21/18 0310  Weight: 72.6 kg 66.3 kg    Physical Exam: General: Ill appearing  Head: Normocephalic, atraumatic.  Eyes: Anicteric, EOMI  Nose: Mucous membranes dry, not inflammed, nonerythematous.  Throat: Unable to visualize   Neck: Supple, trachea midline.  Lungs:  Normal respiratory effort. Clear to auscultation BL without crackles or wheezes.  Heart: RRR. S1 and S2 normal without gallop, murmur, or rubs.  Abdomen:  BS normoactive. Soft, Nondistended, non-tender.  No masses or organomegaly.  Extremities: No pretibial edema.  Neurologic: Lethargic, difficult to arouse  Skin: No visible rashes, scars.    Lab results: Basic Metabolic Panel: Recent Labs  Lab 09/20/18 0408 09/21/18 0512 09/22/18 0415  NA 143 144 150*  K 4.2 4.5 4.3  CL 107 111 118*  CO2 25 22 20*  GLUCOSE 120* 112* 95  BUN 58* 75* 84*  CREATININE 2.91* 4.33* 3.50*  CALCIUM 9.0 8.1* 8.0*    Liver Function Tests: Recent Labs  Lab 09/19/18 2023  AST 41  ALT 9  ALKPHOS 70  BILITOT 0.5  PROT 5.6*  ALBUMIN 2.8*   No results for input(s): LIPASE, AMYLASE in the last 168 hours. No results for input(s): AMMONIA in the last 168 hours.  CBC: Recent Labs  Lab 09/19/18 2023 09/20/18 0408 09/22/18 0415  WBC 9.9 11.1* 7.1  HGB 12.6 12.7 10.6*  HCT 38.5 39.2 33.5*  MCV 91.9 92.7 93.6  PLT 128* 145* 182    Cardiac Enzymes: No results for input(s): CKTOTAL, CKMB, CKMBINDEX, TROPONINI in the last 168 hours.  BNP: Invalid input(s):  POCBNP  CBG: Recent Labs  Lab 09/21/18 1136 09/21/18 1658 09/21/18 2212 09/22/18 0722 09/22/18 1140  GLUCAP 115* 102* 123* 91 106*    Microbiology: Results for orders placed or performed during the hospital encounter of 09/19/18  Blood culture (routine x 2)     Status: None (Preliminary result)   Collection Time: 09/19/18  8:23 PM   Specimen: BLOOD  Result Value Ref Range Status   Specimen Description BLOOD LEFT AC  Final   Special Requests   Final    BOTTLES DRAWN AEROBIC AND ANAEROBIC Blood Culture results may not be optimal due to an excessive volume of blood received in culture bottles   Culture   Final    NO GROWTH 3 DAYS Performed at [facility], [address] [place], [place] [address]    Report Status PENDING  Incomplete  Blood culture (routine x 2)     Status: None (Preliminary result)   Collection Time: 09/19/18  8:23 PM   Specimen: BLOOD  Result Value Ref Range Status   Specimen Description BLOOD LEFT HAND  Final   Special Requests   Final    BOTTLES DRAWN AEROBIC AND ANAEROBIC Blood Culture adequate volume   Culture   Final    NO GROWTH 3 DAYS Performed at [facility], [address] [place], [place] [address]    Report Status PENDING  Incomplete  SARS Coronavirus 2 (CEPHEID - Performed in [facility]), Hosp Order     Status: None   Collection Time: 09/19/18  8:23 PM   Specimen: Nasopharyngeal Swab  Result Value Ref Range Status   SARS Coronavirus 2 NEGATIVE NEGATIVE Final    Comment: (NOTE) If result is NEGATIVE SARS-CoV-2 target nucleic acids are NOT DETECTED. The SARS-CoV-2 RNA is generally detectable in upper and lower  respiratory specimens during the acute phase of infection. The lowest  concentration of SARS-CoV-2 viral copies this assay can detect is 250  copies / mL. A negative result does not preclude SARS-CoV-2 infection  and should not be used as the sole basis for treatment or other  patient management  decisions.  A negative result may occur with  improper specimen collection / handling, submission of specimen other  than nasopharyngeal swab, presence of viral mutation(s) within the  areas targeted by this assay, and inadequate number of viral copies  (<250 copies / mL). A negative result must be combined with clinical  observations, patient history, and epidemiological information. If result is POSITIVE SARS-CoV-2 target nucleic acids are DETECTED. The SARS-CoV-2 RNA is generally detectable in upper and lower  respiratory specimens dur ing the acute phase of infection.  Positive  results are indicative of active infection with SARS-CoV-2.  Clinical  correlation with patient  history and other diagnostic information is  necessary to determine patient infection status.  Positive results do  not rule out bacterial infection or co-infection with other viruses. If result is PRESUMPTIVE POSTIVE SARS-CoV-2 nucleic acids MAY BE PRESENT.   A presumptive positive result was obtained on the submitted specimen  and confirmed on repeat testing.  While 2019 novel coronavirus  (SARS-CoV-2) nucleic acids may be present in the submitted sample  additional confirmatory testing may be necessary for epidemiological  and / or clinical management purposes  to differentiate between  SARS-CoV-2 and other Sarbecovirus currently known to infect humans.  If clinically indicated additional testing with an alternate test  methodology [phone]([id number]) is advised. The SARS-CoV-2 RNA is generally  detectable in upper and lower respiratory sp ecimens during the acute  phase of infection. The expected result is Negative. Fact Sheet for Patients:  BoilerBrush.com.cyhttps://www.fda.gov/media/136312/download Fact Sheet for Healthcare Providers: https://pope.com/https://www.fda.gov/media/136313/download This test is not yet approved or cleared by the [place] FDA and has been authorized for detection and/or diagnosis of SARS-CoV-2 by FDA under an  Emergency Use Authorization (EUA).  This EUA will remain in effect (meaning this test can be used) for the duration of the COVID-19 declaration under Section 564(b)(1) of the Act, 21 U.S.C. section 360bbb-3(b)(1), unless the authorization is terminated or revoked sooner. Performed at [facility], [address] [place], [place] [address]   C difficile quick scan w PCR reflex     Status: Abnormal   Collection Time: 09/20/18  9:13 AM   Specimen: STOOL  Result Value Ref Range Status   C Diff antigen POSITIVE (A) NEGATIVE Final   C Diff toxin POSITIVE (A) NEGATIVE Final   C Diff interpretation Toxin producing C. difficile detected.  Final    Comment: CRITICAL RESULT CALLED TO, READ BACK BY AND VERIFIED WITH:  Hyman HopesAYLOR BECK AT 1052 09/20/2018 SDR Performed at [facility], [address] [place], [place] [address]   Gastrointestinal Panel by PCR , Stool     Status: None   Collection Time: 09/20/18  9:13 AM   Specimen: STOOL  Result Value Ref Range Status   Campylobacter species NOT DETECTED NOT DETECTED Final   Plesimonas shigelloides NOT DETECTED NOT DETECTED Final   Salmonella species NOT DETECTED NOT DETECTED Final   Yersinia enterocolitica NOT DETECTED NOT DETECTED Final   Vibrio species NOT DETECTED NOT DETECTED Final   Vibrio cholerae NOT DETECTED NOT DETECTED Final   Enteroaggregative E coli (EAEC) NOT DETECTED NOT DETECTED Final   Enteropathogenic E coli (EPEC) NOT DETECTED NOT DETECTED Final   Enterotoxigenic E coli (ETEC) NOT DETECTED NOT DETECTED Final   Shiga like toxin producing E coli (STEC) NOT DETECTED NOT DETECTED Final   Shigella/Enteroinvasive E coli (EIEC) NOT DETECTED NOT DETECTED Final   Cryptosporidium NOT DETECTED NOT DETECTED Final   Cyclospora cayetanensis NOT DETECTED NOT DETECTED Final   Entamoeba histolytica NOT DETECTED NOT DETECTED Final   Giardia lamblia NOT DETECTED NOT DETECTED Final   Adenovirus F40/41 NOT DETECTED NOT DETECTED  Final   Astrovirus NOT DETECTED NOT DETECTED Final   Norovirus GI/GII NOT DETECTED NOT DETECTED Final   Rotavirus A NOT DETECTED NOT DETECTED Final   Sapovirus (I, II, IV, and V) NOT DETECTED NOT DETECTED Final    Comment: Performed at [facility], [address] [place], [place] [address]    Coagulation Studies: Recent Labs    09/19/18 2023  LABPROT 12.1  INR 0.9    Urinalysis:  Recent Labs    09/19/18 2023  COLORURINE YELLOW*  LABSPEC 1.014  PHURINE 5.0  GLUCOSEU NEGATIVE  HGBUR NEGATIVE  BILIRUBINUR NEGATIVE  KETONESUR NEGATIVE  PROTEINUR 100*  NITRITE NEGATIVE  LEUKOCYTESUR TRACE*      Imaging: US Renal  Result Date: 09/21/2018 CLINICAL DATA:  Acute renal failure. EXAM: RENAL / URINARY TRACT ULTRASOUND COMPLETE COMPARISON:  None. FINDINGS: Right Kidney: Renal measurements: 11.1 x 4.0 x 3.7 cm = volume: 89 mL . Echogenicity within normal limits. No mass or hydronephrosis visualized. Left Kidney: Renal measurements: 10.1 x 5.0 x 3.9 cm = volume: 102.3 mL. Echogenicity within normal limits. No mass or hydronephrosis visualized. Bladder: Appears normal for degree of bladder distention. IMPRESSION: No cause for acute renal failure identified. Electronically Signed   By: Gerome Sam III M.D   On: 09/21/2018 11:05      Assessment & Plan: Pt is a 85 y.o. female with a PMHx of osteoarthritis, GERD, hypertension, osteoporosis, Parkinson's, prior history of pneumonia, who was admitted to [facility] on 09/19/2018 for evaluation of loss of consciousness.  1.  Acute renal failure. 2.  Proteinuria. 3.  Hyponatremia. 4.  Metabolic acidosis.  Plan: We are asked to see the patient for evaluation management of acute renal failure.  It appears that her creatinine peaked yesterday.  Urine output was only 780 cc over the preceding 24 hours.  She has also developed hypernatremia.  Discontinue D5W and opt for D5W at 100 cc/h.  Patient would not make a good long-term dialysis  candidate.  Palliative and hospice care also being considered at the moment.  Continue conservative measures for now.  Thanks for consultation.

## 2018-09-22 NOTE — Progress Notes (Signed)
Daily Progress Note   Patient Name: Michelle Beck       Date: 09/22/2018 DOB: Sep 20, 1929  Age: 85 y.o. MRN#: [id number] Attending Physician: Gladstone Lighter, MD Primary Care Physician: Rusty Aus, MD Admit Date: 09/19/2018  Reason for Consultation/Follow-up: Establishing goals of care  Subjective: Patient is resting in bed. Met with husband and son at bedside. Other children on the phone.  Discussed her diagnosis, prognosis, GOC, EOL wishes disposition and options with patient and family. We discussed her poor oral intake, pureed and nectar thick diet, and requirement for IV fluids. We discussed her renal response to therapy. We discussed her mental status, as well as her previous hospitalization.   A detailed discussion was had today regarding advanced directives.  Concepts specific to code status, artifical feeding and hydration, IV antibiotics and rehospitalization were discussed.  The difference between an aggressive medical intervention path and a comfort care path was discussed.  Values and goals of care important to patient and family were attempted to be elicited.  Discussed limitations of medical interventions to prolong quality of life in some situations and discussed the concept of human mortality. Natural trajectory was discussed. Concept of Hospice and Palliative Care were discussed.  Mr. Nutter and family state they do not want her to suffer. Son states she has declined and they worry for her QOL.  Patient initially states she wants to try to continue care. Daughter Gregary Signs who worked for hospice, discussed quality and quantity. They would like to focus on comfort. They discuss home with hospice vs hospice facility. They believe hospice facility would be the best plan, but they are  unsure and want to speak tonight as a family.      Length of Stay: 3  Current Medications: Scheduled Meds:  . acidophilus  1 capsule Oral Daily  . Carbidopa-Levodopa ER  1 tablet Oral QID  . heparin injection (subcutaneous)  5,000 Units Subcutaneous Q8H  . insulin aspart  0-5 Units Subcutaneous QHS  . insulin aspart  0-9 Units Subcutaneous TID WC  . metoprolol tartrate  12.5 mg Oral Daily  . rOPINIRole  2 mg Oral TID  . sodium chloride flush  3 mL Intravenous Q12H  . sodium chloride flush  3 mL Intravenous Q12H  . vancomycin  125 mg Oral Q6H  Continuous Infusions: . sodium chloride 75 mL/hr at 09/22/18 0901    PRN Meds: acetaminophen **OR** acetaminophen, ondansetron **OR** ondansetron (ZOFRAN) IV, risperiDONE, sodium chloride flush  Physical Exam HENT:     Head: Normocephalic.  Pulmonary:     Effort: Pulmonary effort is normal.  Skin:    General: Skin is warm and dry.  Neurological:     Mental Status: She is alert.             Vital Signs: BP (!) 142/64 (BP Location: Left Arm)   Pulse 81   Temp (!) 97.4 F (36.3 C) (Axillary)   Resp 16   Ht '5\' 4"'$  (1.626 m)   Wt 66.3 kg   SpO2 99%   BMI 25.10 kg/m  SpO2: SpO2: 99 % O2 Device: O2 Device: Nasal Cannula O2 Flow Rate: O2 Flow Rate (L/min): 2 L/min  Intake/output summary:   Intake/Output Summary (Last 24 hours) at 09/22/2018 1326 Last data filed at 09/21/2018 2300 Gross per 24 hour  Intake -  Output 780 ml  Net -780 ml   LBM: Last BM Date: 09/21/18 Baseline Weight: Weight: 72.6 kg Most recent weight: Weight: 66.3 kg       Palliative Assessment/Data:      Patient Active Problem List   Diagnosis Date Noted  . Pressure injury of skin 09/20/2018  . HTN (hypertension) 09/19/2018  . GERD (gastroesophageal reflux disease) 09/19/2018  . NSTEMI (non-ST elevated myocardial infarction) ([place]) 09/19/2018  . Goals of care, counseling/discussion   . Palliative care by specialist   . DNR (do not resuscitate)  discussion   . AMS (altered mental status) 09/14/2018  . Parkinson's disease ([place]) 08/24/2012    Palliative Care Assessment & Plan    Assessment: Creatinine is somewhat down trending with IV fluids. Poor oral intake. C-diff.   Recommendations/Plan:  Will reach out to family tomorrow for further planning. The family wants to talk together tonight.   Code Status:    Code Status Orders  (From admission, onward)         Start     Ordered   09/20/18 0207  Do not attempt resuscitation (DNR)  Continuous    Question Answer Comment  In the event of cardiac or respiratory ARREST Do not call a "code blue"   In the event of cardiac or respiratory ARREST Do not perform Intubation, CPR, defibrillation or ACLS   In the event of cardiac or respiratory ARREST Use medication by any route, position, wound care, and other measures to relive pain and suffering. May use oxygen, suction and manual treatment of airway obstruction as needed for comfort.      09/20/18 0206        Code Status History    Date Active Date Inactive Code Status Order ID Comments User Context   09/15/2018 1523 09/16/2018 2106 DNR [id number]  Drue Novel, NP Inpatient   09/14/2018 1853 09/15/2018 1523 Full Code [id number]  Mayer Camel, NP ED   Advance Care Planning Activity    Advance Directive Documentation     Most Recent Value  Type of Advance Directive  Healthcare Power of Attorney, Living will  Pre-existing out of facility DNR order (yellow form or pink MOST form)  -  "MOST" Form in Place?  -       Prognosis:  Poor  Discharge Planning:  To Be Determined  Care plan was discussed with RN  Thank you for allowing the Palliative Medicine Team to assist in the care  of this patient.   Total Time 12:00-2:00 min Prolonged Time Billed  yes       Greater than 50%  of this time was spent counseling and coordinating care related to the above assessment and plan.  Asencion Gowda, NP  Please contact  Palliative Medicine Team phone at [phone] for questions and concerns.

## 2018-09-22 NOTE — Plan of Care (Signed)
  Problem: Clinical Measurements: Goal: Will remain free from infection Outcome: Progressing   Problem: Nutrition: Goal: Adequate nutrition will be maintained Outcome: Progressing  Speech consulted Problem: Elimination: Goal: Will not experience complications related to urinary retention Outcome: Not Progressing

## 2018-09-22 NOTE — Plan of Care (Signed)
  Problem: Clinical Measurements: Goal: Ability to maintain clinical measurements within normal limits will improve Outcome: Not Progressing Goal: Diagnostic test results will improve Outcome: Not Progressing   

## 2018-09-22 NOTE — Progress Notes (Signed)
SLP Cancellation Note  Patient Details Name: Michelle Beck MRN: [id number] DOB: 21-Feb-1930   Cancelled treatment:       Reason Eval/Treat Not Completed: Patient at procedure or test/unavailable(chart reviewed; consulted NSG then attempted to see pt). Pt and family meeting w/ Palliative Care for consultation; attempted a second time after there family meeting and pt was being fed her lunch meal by NA. Noted pt to take several TSP boluses of Nectar liquids then few TSP boluses of puree w/ NO overt s/s of aspiration noted, no decline in respiratory status during/post the po boluses. This appears to be the best diet consistency for pt for safe oral intake; general aspiration precautions. ST recommends continuing this diet consistency at discharge w/ Pills in puree for safer swallowing. ST services will continue to monitor pt's status next 1-3 days - family would like to focus on comfort care per notes.     Orinda Kenner, MS, CCC-SLP Watson,Katherine 09/22/2018, 3:58 PM

## 2018-09-23 LAB — BASIC METABOLIC PANEL
Anion gap: 9 (ref 5–15)
BUN: 80 mg/dL — ABNORMAL HIGH (ref 8–23)
CO2: 21 mmol/L — ABNORMAL LOW (ref 22–32)
Calcium: 7.6 mg/dL — ABNORMAL LOW (ref 8.9–10.3)
Chloride: 113 mmol/L — ABNORMAL HIGH (ref 98–111)
Creatinine, Ser: 2.31 mg/dL — ABNORMAL HIGH (ref 0.44–1.00)
GFR calc Af Amer: 21 mL/min — ABNORMAL LOW (ref >60)
GFR calc non Af Amer: 18 mL/min — ABNORMAL LOW (ref >60)
Glucose, Bld: 130 mg/dL — ABNORMAL HIGH (ref 70–99)
Potassium: 3.6 mmol/L (ref 3.5–5.1)
Sodium: 143 mmol/L (ref 135–145)

## 2018-09-23 LAB — CBC
HCT: 31.5 % — ABNORMAL LOW (ref 36.0–46.0)
Hemoglobin: 10.3 g/dL — ABNORMAL LOW (ref 12.0–15.0)
MCH: 30 pg (ref 26.0–34.0)
MCHC: 32.7 g/dL (ref 30.0–36.0)
MCV: 91.8 fL (ref 80.0–100.0)
Platelets: 185 10*3/uL (ref 150–400)
RBC: 3.43 MIL/uL — ABNORMAL LOW (ref 3.87–5.11)
RDW: 15.5 % (ref 11.5–15.5)
WBC: 6.4 10*3/uL (ref 4.0–10.5)
nRBC: 0 % (ref 0.0–0.2)

## 2018-09-23 LAB — GLUCOSE, CAPILLARY
Glucose-Capillary: 214 mg/dL — ABNORMAL HIGH (ref 70–99)
Glucose-Capillary: 139 mg/dL — ABNORMAL HIGH (ref 70–99)
Glucose-Capillary: 121 mg/dL — ABNORMAL HIGH (ref 70–99)
Glucose-Capillary: 144 mg/dL — ABNORMAL HIGH (ref 70–99)
Glucose-Capillary: 207 mg/dL — ABNORMAL HIGH (ref 70–99)
Glucose-Capillary: 171 mg/dL — ABNORMAL HIGH (ref 70–99)

## 2018-09-23 MED ORDER — ALBUMIN HUMAN 25 % IV SOLN
25.0000 g | Freq: Three times a day (TID) | INTRAVENOUS | Status: AC
  Administered 2018-09-23 – 2018-09-24 (×4): 25 g via INTRAVENOUS
  Filled 2018-09-23 (×4): qty 100

## 2018-09-23 NOTE — Progress Notes (Signed)
Daily Progress Note   Patient Name: Michelle Beck       Date: 09/23/2018 DOB: 1929-12-15  Age: 85 y.o. MRN#: [id number] Attending Physician: Enid BaasKalisetti, Radhika, MD Primary Care Physician: Danella PentonMiller, Mark F, MD Admit Date: 09/19/2018  Reason for Consultation/Follow-up: Establishing goals of care  Subjective: Patient sitting in bedside chair eating lunch. Son is at bedside, he states she is doing much better with drinking liquids. She ate half of her lunch. Her voice is weak but clear with attempting to speak. She complains of pain in her right jaw with trying to eat. There is a large fluctuant mass which she states has been evaluated with recommendation for removal, however she was advised she would need to spend the night in the hospital, and does not want to. Son states she was afraid that if she had anesthesia, it would cause cognitive issues.   She has improved cognitively from yesterday. IV rehydration remains in place. Edema noted in left arm and legs.  Son states the family has talked, and would like to continue care a few more days to see how much she improves. Hospice liasion relayed this information as well earlier today after speaking with family. Son states they are aware the IV fluid is sustaining her, and she will need to take enough orally to sustain her with both hydration and nourishment. She is unsure if she would want to return to the hospital if she were to decline again.  He states one of her children, a son,  is building a house and is getting married, and she wants to live to see that. She agreed.   Spoke with Mr. Danielle Dessaris. He is in agreement with this plan. He states he is still thinking of her QOL, and wants to honor her wishes.   If she chooses to pursue life prolonging measures,  would recommend palliative at D/C. If she chooses to focus on a comfort path, would recommend hospice at home.      Length of Stay: 4  Current Medications: Scheduled Meds:  . acidophilus  1 capsule Oral Daily  . Carbidopa-Levodopa ER  1 tablet Oral QID  . heparin injection (subcutaneous)  5,000 Units Subcutaneous Q8H  . insulin aspart  0-5 Units Subcutaneous QHS  . insulin aspart  0-9 Units Subcutaneous TID WC  .  metoprolol tartrate  12.5 mg Oral Daily  . rOPINIRole  2 mg Oral TID  . sodium chloride flush  3 mL Intravenous Q12H  . vancomycin  125 mg Oral Q6H    Continuous Infusions: . dextrose 100 mL/hr at 09/23/18 0521    PRN Meds: acetaminophen **OR** acetaminophen, ondansetron **OR** ondansetron (ZOFRAN) IV, risperiDONE, sodium chloride flush  Physical Exam HENT:     Head: Normocephalic.  Pulmonary:     Effort: Pulmonary effort is normal.  Skin:    General: Skin is warm and dry.     Comments: Edema  Neurological:     Mental Status: She is alert.             Vital Signs: BP (!) 141/60 (BP Location: Right Arm)   Pulse 76   Temp 97.7 F (36.5 C)   Resp 16   Ht 5\' 4"  (1.626 m)   Wt 66.3 kg   SpO2 99%   BMI 25.10 kg/m  SpO2: SpO2: 99 % O2 Device: O2 Device: Nasal Cannula O2 Flow Rate: O2 Flow Rate (L/min): 2 L/min  Intake/output summary:   Intake/Output Summary (Last 24 hours) at 09/23/2018 1320 Last data filed at 09/23/2018 0427 Gross per 24 hour  Intake 1548.19 ml  Output 650 ml  Net 898.19 ml   LBM: Last BM Date: 09/22/18 Baseline Weight: Weight: 72.6 kg Most recent weight: Weight: 66.3 kg       Palliative Assessment/Data:      Patient Active Problem List   Diagnosis Date Noted  . Pressure injury of skin 09/20/2018  . HTN (hypertension) 09/19/2018  . GERD (gastroesophageal reflux disease) 09/19/2018  . NSTEMI (non-ST elevated myocardial infarction) ([place]) 09/19/2018  . Goals of care, counseling/discussion   . Palliative care by specialist    . DNR (do not resuscitate) discussion   . AMS (altered mental status) 09/14/2018  . Parkinson's disease ([place]) 08/24/2012    Palliative Care Assessment & Plan    Assessment: Creatinine is down trending with IV fluids, but still is not normalized. Intake improving. C-diff with abx. WBC WNL.    Recommendations/Plan:  Family wants a few more days of medical tx to see how she does before making a final decision on disposition.    If she chooses to pursue life prolonging measures, would recommend palliative at D/C. If she chooses to focus on a comfort path, would recommend hospice at home.    Code Status:    Code Status Orders  (From admission, onward)         Start     Ordered   09/20/18 0207  Do not attempt resuscitation (DNR)  Continuous    Question Answer Comment  In the event of cardiac or respiratory ARREST Do not call a "code blue"   In the event of cardiac or respiratory ARREST Do not perform Intubation, CPR, defibrillation or ACLS   In the event of cardiac or respiratory ARREST Use medication by any route, position, wound care, and other measures to relive pain and suffering. May use oxygen, suction and manual treatment of airway obstruction as needed for comfort.      09/20/18 0206        Code Status History    Date Active Date Inactive Code Status Order ID Comments User Context   09/15/2018 1523 09/16/2018 2106 DNR [id number]  Drue Novel, NP Inpatient   09/14/2018 1853 09/15/2018 1523 Full Code [id number]  Mayer Camel, NP ED   Advance Care Planning Activity  Advance Directive Documentation     Most Recent Value  Type of Advance Directive  Healthcare Power of Attorney, Living will  Pre-existing out of facility DNR order (yellow form or pink MOST form)  -  "MOST" Form in Place?  -       Prognosis:  Poor overall  Discharge Planning:  To Be Determined  Care plan was discussed with RN  Thank you for allowing the Palliative Medicine Team to assist in  the care of this patient.   Total Time 12:55-2:05 Prolonged Time Billed  yes       Greater than 50%  of this time was spent counseling and coordinating care related to the above assessment and plan.  Morton Stallrystal Haileigh Pitz, NP  Please contact Palliative Medicine Team phone at [phone] for questions and concerns.

## 2018-09-23 NOTE — Care Management Important Message (Signed)
Important Message  Patient Details  Name: Michelle Beck MRN: [id number] Date of Birth: Dec 16, 1929   Medicare Important Message Given:  Yes  Initial Medicare IM given by Patient Access Associate on 09/23/2018 at 11:45am.  Still valid.   Dannette Barbara 09/23/2018, 12:38 PM

## 2018-09-23 NOTE — Progress Notes (Signed)
Physical Therapy Treatment Patient Details Name: Michelle Beck MRN: [id number] DOB: 12-Feb-1930 Today's Date: 09/23/2018    History of Present Illness 85 y.o. female with a known history of Parkinson's disease, hypertension, asthma, arthritis, GERD.  She arrived to the emergency room via EMS services for altered mental status, syncopal episode.    PT Comments    Patient eager for and requesting OOB attempts this date.  Able to complete with max assist +2 via scoot/squat pivot over level surfaces.  Extensive assist for trunk control, lift off and lateral movement, but does accept weight through LEs without buckling during transfer and sit/stand efforts. Patient/son very pleased to be OOB; may consider use of lift for return to bed (nursing aware). Of note, significant redness noted to bilat heels during mobility efforts; RN informed/aware. Heels floated end of session (Son educated on role of floating and positioning); may benefit from use of prevalon boots to further protect.    Follow Up Recommendations  SNF     Equipment Recommendations       Recommendations for Other Services       Precautions / Restrictions Precautions Precautions: Fall Precaution Comments: Nectar Restrictions Weight Bearing Restrictions: No    Mobility  Bed Mobility Overal bed mobility: Needs Assistance Bed Mobility: Rolling Rolling: Mod assist;Max assist   Supine to sit: Max assist;+2 for physical assistance     General bed mobility comments: generally rigid with limited ROM, poor ability to dissociate trunk and extremities  Transfers Overall transfer level: Needs assistance Equipment used: Rolling walker (2 wheeled) Transfers: Squat Pivot Transfers     Squat pivot transfers: Max assist;+2 physical assistance     General transfer comment: max/total assist for trunk control, forward weight shift/trunk lean, lift off and lateral movement  Ambulation/Gait             General Gait Details:  unsafe/unable   Stairs             Wheelchair Mobility    Modified Rankin (Stroke Patients Only)       Balance Overall balance assessment: Needs assistance Sitting-balance support: No upper extremity supported;Feet supported Sitting balance-Leahy Scale: Poor Sitting balance - Comments: min assist for R posterior/lateral lean, decreased balance /righting reacitons   Standing balance support: Bilateral upper extremity supported Standing balance-Leahy Scale: Zero Standing balance comment: bilat UEs support on therapist                            Cognition Arousal/Alertness: Awake/alert Behavior During Therapy: Flat affect Overall Cognitive Status: Within Functional Limits for tasks assessed                                        Exercises Other Exercises Other Exercises: Rolling bilat, max assist +2 for full rotation; hand-over-hand assist for UE/LE movement and positioning.  Dep assist for hygiene, clothing and linen change after incontinent bowel episode. Other Exercises: Bed/BSC transfer, scoot/squat pivot, max assist +2 for trunk control, lift off and lateral movement Other Exercises: Sit/stand without assist device, max assist +2 fo lift off and balance; does accept weight through LEs without buckling in supported standing.  Unable to generate weight shift for LE advancement/stepping    General Comments        Pertinent Vitals/Pain Pain Assessment: Faces Faces Pain Scale: Hurts even more Pain Location: bilat heels Pain Descriptors /  Indicators: Aching;Guarding;Grimacing    Home Living                      Prior Function            PT Goals (current goals can now be found in the care plan section) Acute Rehab PT Goals Patient Stated Goal: to get out of the bed PT Goal Formulation: With patient Time For Goal Achievement: 09/30/18 Potential to Achieve Goals: Fair Progress towards PT goals: Progressing toward goals     Frequency    Min 2X/week      PT Plan Current plan remains appropriate    Co-evaluation              AM-PAC PT "6 Clicks" Mobility   Outcome Measure  Help needed turning from your back to your side while in a flat bed without using bedrails?: Total Help needed moving from lying on your back to sitting on the side of a flat bed without using bedrails?: Total Help needed moving to and from a bed to a chair (including a wheelchair)?: Total Help needed standing up from a chair using your arms (e.g., wheelchair or bedside chair)?: Total Help needed to walk in hospital room?: Total Help needed climbing 3-5 steps with a railing? : Total 6 Click Score: 6    End of Session Equipment Utilized During Treatment: Gait belt Activity Tolerance: Patient tolerated treatment well Patient left: in chair;with family/visitor present;with call bell/phone within reach(pad under patient, son in room; box not available. Nursing aware of patient position) Nurse Communication: Mobility status PT Visit Diagnosis: Muscle weakness (generalized) (M62.81);Difficulty in walking, not elsewhere classified (R26.2)     Time: [id number] PT Time Calculation (min) (ACUTE ONLY): 45 min  Charges:  $Therapeutic Activity: 38-52 mins                    Katelin Kutsch H. Manson PasseyBrown, PT, DPT, NCS 09/23/18, 1:40 PM [phone]

## 2018-09-23 NOTE — Progress Notes (Signed)
Please note, referral received on 7/1 for [facility], after multiple conversations with patient's family, they have decided to continue current care as patient is showing some improvement.  Writer spoke with patient's son Merry Proud today at bedside, he remains hopeful that patient will continue to improve. Patient's information has been sent to referral, if patient declines and family wishes to pursue hospice facility again or home with hospice please reconsult hospital liaison. Thank you Flo Shanks BSN, RN, [facility] Liaison [phone]

## 2018-09-23 NOTE — Progress Notes (Signed)
Speech Therapy Note: reviewed chart notes; consulted MD. Pt's medical and Cognitive status is slightly improved from yesterday. She remains on IV fluids and NCO2 at 2 liters for support. Per chart notes, she ate some at her lunch meal - Son was at bedside stating she is doing much better with drinking the Nectar consistency liquids. She complains of pain in her right jaw with trying to eat; tere is a large fluctuant mass which she states has been evaluated with recommendation for removal, however she has not done so d/t concerns of complications. Her voice is weak but clear when speaking w/ others.  Overall, pt appears to be tolerating the current recommended Dysphagia diet w/ No reported s/s of aspiration or decline in status noted. While pt remains hospitalized in such a weakened medical state, recommend continuation of the Dysphagia diet w/ general aspiration precautions and support w/ feeding at meals. Pt and family can have ST services f/u post Discharge for trials to upgrade diet consistency w/ pt's medical status AND Stamina have improved - of note, pt does have a baseline dx of Parkinson's Disease which can increase risk for aspiration thus Pulmonary decline. Recommend frequent oral care.     Orinda Kenner, [place], CCC-SLP

## 2018-09-23 NOTE — Progress Notes (Signed)
She has low urine output. 725 over 20 hours, and is being hydrated to improve her kidney function and treat her dehydration.  She is now 3rd spacing fluid.  Her dependent areas are swollen, her back, buttock, upper posterior thighs, left arm, and ankles and heels.She has been started on albumin.  Placed una boots are her feet for protection. Son is here, very optimistic about how she is doing.  He's pleased that her creatinine has come down and that she is more alert and oriented.  He doesn't seem to comprehend the complexity of her health status.  Wants to know how we can get the swelling down - expects the albumin to have rapid results.

## 2018-09-23 NOTE — Progress Notes (Signed)
[facility]  ROUNDING NOTE   Subjective:  Renal function is slowly improving. Creatinine down to 2.31. More awake and alert this a.m.   Objective:  Vital signs in last 24 hours:  Temp:  [97.5 F (36.4 C)-98 F (36.7 C)] 97.7 F (36.5 C) (07/02 0743) Pulse Rate:  [72-81] 76 (07/02 0743) Resp:  [16-20] 16 (07/02 0846) BP: (113-141)/(49-62) 141/60 (07/02 0743) SpO2:  [96 %-99 %] 99 % (07/02 0743)  Weight change:  Filed Weights   09/19/18 2009 09/21/18 0310  Weight: 72.6 kg 66.3 kg    Intake/Output: I/O last 3 completed shifts: In: 1548.2 [I.V.:1548.2] Out: 650 [Urine:650]   Intake/Output this shift:  Total I/O In: -  Out: 225 [Urine:225]  Physical Exam: General: No acute distress  Head: Normocephalic, atraumatic. Moist oral mucosal membranes  Eyes: Anicteric  Neck: Supple, trachea midline  Lungs:  Clear to auscultation, normal effort  Heart: S1S2 no rubs  Abdomen:  Soft, nontender, bowel sounds present  Extremities: trace peripheral edema.  Neurologic: Awake, alert, following commands  Skin: No lesions       Basic Metabolic Panel: Recent Labs  Lab 09/19/18 2023 09/20/18 0408 09/21/18 0512 09/22/18 0415 09/23/18 0651  NA 142 143 144 150* 143  K 4.2 4.2 4.5 4.3 3.6  CL 106 107 111 118* 113*  CO2 24 25 22  20* 21*  GLUCOSE 175* 120* 112* 95 130*  BUN 53* 58* 75* 84* 80*  CREATININE 2.80* 2.91* 4.33* 3.50* 2.31*  CALCIUM 9.3 9.0 8.1* 8.0* 7.6*    Liver Function Tests: Recent Labs  Lab 09/19/18 2023  AST 41  ALT 9  ALKPHOS 70  BILITOT 0.5  PROT 5.6*  ALBUMIN 2.8*   No results for input(s): LIPASE, AMYLASE in the last 168 hours. No results for input(s): AMMONIA in the last 168 hours.  CBC: Recent Labs  Lab 09/19/18 2023 09/20/18 0408 09/22/18 0415 09/23/18 0651  WBC 9.9 11.1* 7.1 6.4  HGB 12.6 12.7 10.6* 10.3*  HCT 38.5 39.2 33.5* 31.5*  MCV 91.9 92.7 93.6 91.8  PLT 128* 145* 182 185    Cardiac Enzymes: No results  for input(s): CKTOTAL, CKMB, CKMBINDEX, TROPONINI in the last 168 hours.  BNP: Invalid input(s): POCBNP  CBG: Recent Labs  Lab 09/22/18 1709 09/22/18 2108 09/23/18 0308 09/23/18 0745 09/23/18 1208  GLUCAP 111* 214* 139* 121* 144*    Microbiology: Results for orders placed or performed during the hospital encounter of 09/19/18  Blood culture (routine x 2)     Status: None (Preliminary result)   Collection Time: 09/19/18  8:23 PM   Specimen: BLOOD  Result Value Ref Range Status   Specimen Description BLOOD LEFT AC  Final   Special Requests   Final    BOTTLES DRAWN AEROBIC AND ANAEROBIC Blood Culture results may not be optimal due to an excessive volume of blood received in culture bottles   Culture   Final    NO GROWTH 4 DAYS Performed at [facility], [address] [place], [place] [address]    Report Status PENDING  Incomplete  Blood culture (routine x 2)     Status: None (Preliminary result)   Collection Time: 09/19/18  8:23 PM   Specimen: BLOOD  Result Value Ref Range Status   Specimen Description BLOOD LEFT HAND  Final   Special Requests   Final    BOTTLES DRAWN AEROBIC AND ANAEROBIC Blood Culture adequate volume   Culture   Final    NO  GROWTH 4 DAYS Performed at [facility], [address] [place], [place] [address]    Report Status PENDING  Incomplete  SARS Coronavirus 2 (CEPHEID - Performed in [facility] lab), Hosp Order     Status: None   Collection Time: 09/19/18  8:23 PM   Specimen: Nasopharyngeal Swab  Result Value Ref Range Status   SARS Coronavirus 2 NEGATIVE NEGATIVE Final    Comment: (NOTE) If result is NEGATIVE SARS-CoV-2 target nucleic acids are NOT DETECTED. The SARS-CoV-2 RNA is generally detectable in upper and lower  respiratory specimens during the acute phase of infection. The lowest  concentration of SARS-CoV-2 viral copies this assay can detect is 250  copies / mL. A negative result does not preclude  SARS-CoV-2 infection  and should not be used as the sole basis for treatment or other  patient management decisions.  A negative result may occur with  improper specimen collection / handling, submission of specimen other  than nasopharyngeal swab, presence of viral mutation(s) within the  areas targeted by this assay, and inadequate number of viral copies  (<250 copies / mL). A negative result must be combined with clinical  observations, patient history, and epidemiological information. If result is POSITIVE SARS-CoV-2 target nucleic acids are DETECTED. The SARS-CoV-2 RNA is generally detectable in upper and lower  respiratory specimens dur ing the acute phase of infection.  Positive  results are indicative of active infection with SARS-CoV-2.  Clinical  correlation with patient history and other diagnostic information is  necessary to determine patient infection status.  Positive results do  not rule out bacterial infection or co-infection with other viruses. If result is PRESUMPTIVE POSTIVE SARS-CoV-2 nucleic acids MAY BE PRESENT.   A presumptive positive result was obtained on the submitted specimen  and confirmed on repeat testing.  While 2019 novel coronavirus  (SARS-CoV-2) nucleic acids may be present in the submitted sample  additional confirmatory testing may be necessary for epidemiological  and / or clinical management purposes  to differentiate between  SARS-CoV-2 and other Sarbecovirus currently known to infect humans.  If clinically indicated additional testing with an alternate test  methodology [phone]([id number]) is advised. The SARS-CoV-2 RNA is generally  detectable in upper and lower respiratory sp ecimens during the acute  phase of infection. The expected result is Negative. Fact Sheet for Patients:  BoilerBrush.com.cyhttps://www.fda.gov/media/136312/download Fact Sheet for Healthcare Providers: https://pope.com/https://www.fda.gov/media/136313/download This test is not yet approved or cleared by the  [place] FDA and has been authorized for detection and/or diagnosis of SARS-CoV-2 by FDA under an Emergency Use Authorization (EUA).  This EUA will remain in effect (meaning this test can be used) for the duration of the COVID-19 declaration under Section 564(b)(1) of the Act, 21 U.S.C. section 360bbb-3(b)(1), unless the authorization is terminated or revoked sooner. Performed at [facility], [address] [place], [place] [address]   C difficile quick scan w PCR reflex     Status: Abnormal   Collection Time: 09/20/18  9:13 AM   Specimen: STOOL  Result Value Ref Range Status   C Diff antigen POSITIVE (A) NEGATIVE Final   C Diff toxin POSITIVE (A) NEGATIVE Final   C Diff interpretation Toxin producing C. difficile detected.  Final    Comment: CRITICAL RESULT CALLED TO, READ BACK BY AND VERIFIED WITH:  Hyman HopesAYLOR BECK AT 1052 09/20/2018 SDR Performed at [facility], [address] [place], [place] [address]   Gastrointestinal Panel by PCR , Stool  Status: None   Collection Time: 09/20/18  9:13 AM   Specimen: STOOL  Result Value Ref Range Status   Campylobacter species NOT DETECTED NOT DETECTED Final   Plesimonas shigelloides NOT DETECTED NOT DETECTED Final   Salmonella species NOT DETECTED NOT DETECTED Final   Yersinia enterocolitica NOT DETECTED NOT DETECTED Final   Vibrio species NOT DETECTED NOT DETECTED Final   Vibrio cholerae NOT DETECTED NOT DETECTED Final   Enteroaggregative E coli (EAEC) NOT DETECTED NOT DETECTED Final   Enteropathogenic E coli (EPEC) NOT DETECTED NOT DETECTED Final   Enterotoxigenic E coli (ETEC) NOT DETECTED NOT DETECTED Final   Shiga like toxin producing E coli (STEC) NOT DETECTED NOT DETECTED Final   Shigella/Enteroinvasive E coli (EIEC) NOT DETECTED NOT DETECTED Final   Cryptosporidium NOT DETECTED NOT DETECTED Final   Cyclospora cayetanensis NOT DETECTED NOT DETECTED Final   Entamoeba histolytica NOT DETECTED NOT DETECTED  Final   Giardia lamblia NOT DETECTED NOT DETECTED Final   Adenovirus F40/41 NOT DETECTED NOT DETECTED Final   Astrovirus NOT DETECTED NOT DETECTED Final   Norovirus GI/GII NOT DETECTED NOT DETECTED Final   Rotavirus A NOT DETECTED NOT DETECTED Final   Sapovirus (I, II, IV, and V) NOT DETECTED NOT DETECTED Final    Comment: Performed at [facility], [address] [place], [place] [address]    Coagulation Studies: No results for input(s): LABPROT, INR in the last 72 hours.  Urinalysis: No results for input(s): COLORURINE, LABSPEC, PHURINE, GLUCOSEU, HGBUR, BILIRUBINUR, KETONESUR, PROTEINUR, UROBILINOGEN, NITRITE, LEUKOCYTESUR in the last 72 hours.  Invalid input(s): APPERANCEUR    Imaging: No results found.   Medications:   . albumin human    . dextrose 100 mL/hr at 09/23/18 0521   . acidophilus  1 capsule Oral Daily  . Carbidopa-Levodopa ER  1 tablet Oral QID  . heparin injection (subcutaneous)  5,000 Units Subcutaneous Q8H  . insulin aspart  0-5 Units Subcutaneous QHS  . insulin aspart  0-9 Units Subcutaneous TID WC  . metoprolol tartrate  12.5 mg Oral Daily  . rOPINIRole  2 mg Oral TID  . sodium chloride flush  3 mL Intravenous Q12H  . vancomycin  125 mg Oral Q6H   acetaminophen **OR** acetaminophen, ondansetron **OR** ondansetron (ZOFRAN) IV, risperiDONE, sodium chloride flush  Assessment/ Plan:  85 y.o. female with a PMHx of osteoarthritis, GERD, hypertension, osteoporosis, Parkinson's, prior history of pneumonia, who was admitted to [facility] on 09/19/2018 for evaluation of loss of consciousness.  1.  Acute renal failure. 2.  Proteinuria. 3.  Hypernatremia. 4.  Metabolic acidosis.  Plan: Creatinine down to 2.3 at the moment.  In addition hypernatremia improved.  Continue D5W at 100 cc/h for 1 additional day.  No acute indication for dialysis as renal function is improving.  Patient remains on vancomycin for treatment of underlying C. difficile colitis.   Further plan as patient progresses.   LOS: 4 Markiah Janeway 7/2/20202:25 PM

## 2018-09-23 NOTE — Progress Notes (Signed)
[employer] [facility] [place] [facility]   PATIENT NAME: Michelle Beck    MR#:  [id number]  DATE OF BIRTH:  24-Sep-1929  SUBJECTIVE:  CHIEF COMPLAINT:   Chief Complaint  Patient presents with  . Loss of Consciousness   -Continues to have diarrhea.  Complains of back pain and wanting to get out of bed.  Appears very weak.  Poor oral intake  REVIEW OF SYSTEMS:  Review of Systems  Constitutional: Positive for malaise/fatigue. Negative for chills and fever.  HENT: Positive for hearing loss. Negative for congestion, ear discharge and nosebleeds.   Eyes: Negative for blurred vision and double vision.  Respiratory: Negative for cough, shortness of breath and wheezing.   Cardiovascular: Negative for chest pain and palpitations.  Gastrointestinal: Positive for abdominal pain and diarrhea. Negative for constipation, nausea and vomiting.  Genitourinary: Negative for dysuria.  Musculoskeletal: Negative for myalgias.  Neurological: Negative for dizziness, focal weakness, seizures, weakness and headaches.  Psychiatric/Behavioral: Negative for depression.    DRUG ALLERGIES:   Allergies  Allergen Reactions  . Fentanyl Anxiety and Shortness Of Breath  . Actonel [Risedronate Sodium] Other (See Comments)    Reaction unknown  . Hydrocodone Other (See Comments)    Reaction unknown  . Iodine Other (See Comments)    Unknown  . Shellfish Allergy Other (See Comments)    Reaction unknown  . Penicillins Rash and Other (See Comments)    Has patient had a PCN reaction causing immediate rash, facial/tongue/throat swelling, SOB or lightheadedness with hypotension: Yes Has patient had a PCN reaction causing severe rash involving mucus membranes or skin necrosis: No Has patient had a PCN reaction that required hospitalization: No Has patient had a PCN reaction occurring within the last 10 years: No If all of the above answers are "NO", then may proceed with Cephalosporin use.      VITALS:  Blood pressure (!) 141/60, pulse 76, temperature 97.7 F (36.5 C), resp. rate 16, height 5\' 4"  (1.626 m), weight 66.3 kg, SpO2 99 %.  PHYSICAL EXAMINATION:  Physical Exam  GENERAL:  85 y.o.-year-old elderly patient lying in the bed with no acute distress.  EYES: Pupils equal, round, reactive to light and accommodation. No scleral icterus. Extraocular muscles intact.  HEENT: Head atraumatic, normocephalic. Oropharynx and nasopharynx clear.  NECK:  Supple, no jugular venous distention. No thyroid enlargement, no tenderness.  LUNGS: Normal breath sounds bilaterally, no wheezing, rales,rhonchi or crepitation. No use of accessory muscles of respiration.  Decreased bibasilar breath sounds. CARDIOVASCULAR: S1, S2 normal. No  rubs, or gallops.  3/6 systolic murmur is present ABDOMEN: Soft, nontender, nondistended. Bowel sounds present. No organomegaly or mass.  EXTREMITIES: No pedal edema, cyanosis, or clubbing.  NEUROLOGIC: Alert and able to answer some simple questions.  Speech is not understandable. not following commands PSYCHIATRIC: The patient is ill-appearing.  Alert and following some simple commands SKIN: No obvious rash, lesion, or ulcer.    LABORATORY PANEL:   CBC Recent Labs  Lab 09/23/18 0651  WBC 6.4  HGB 10.3*  HCT 31.5*  PLT 185   ------------------------------------------------------------------------------------------------------------------  Chemistries  Recent Labs  Lab 09/19/18 2023  09/23/18 0651  NA 142   < > 143  K 4.2   < > 3.6  CL 106   < > 113*  CO2 24   < > 21*  GLUCOSE 175*   < > 130*  BUN 53*   < > 80*  CREATININE 2.80*   < > 2.31*  CALCIUM 9.3   < > 7.6*  AST 41  --   --   ALT 9  --   --   ALKPHOS 70  --   --   BILITOT 0.5  --   --    < > = values in this interval not displayed.   ------------------------------------------------------------------------------------------------------------------  Cardiac Enzymes No results for  input(s): TROPONINI in the last 168 hours. ------------------------------------------------------------------------------------------------------------------  RADIOLOGY:  US Renal  Result Date: 09/21/2018 CLINICAL DATA:  Acute renal failure. EXAM: RENAL / URINARY TRACT ULTRASOUND COMPLETE COMPARISON:  None. FINDINGS: Right Kidney: Renal measurements: 11.1 x 4.0 x 3.7 cm = volume: 89 mL . Echogenicity within normal limits. No mass or hydronephrosis visualized. Left Kidney: Renal measurements: 10.1 x 5.0 x 3.9 cm = volume: 102.3 mL. Echogenicity within normal limits. No mass or hydronephrosis visualized. Bladder: Appears normal for degree of bladder distention. IMPRESSION: No cause for acute renal failure identified. Electronically Signed   By: Dorise Bullion III M.D   On: 09/21/2018 11:05    EKG:   Orders placed or performed during the hospital encounter of 09/19/18  . EKG 12-Lead  . EKG 12-Lead    ASSESSMENT AND PLAN:   85 year old female with past medical history significant for arthritis, GERD, Parkinson's, osteoporosis, hypertension was brought in secondary to a syncopal episode.  1.  Acute renal failure-likely prerenal on admission due to diarrhea.  Now ATN possibly. -Avoid nephrotoxins.  Creatinine is improving slowly. -Continues to retain urine.  Foley catheter has been placed. - maintain strict input and output record -Renal ultrasound is completely normal. Continue IV fluids.  Nephrology has been consulted  2.  Elevated troponin-high-sensitivity troponin levels are stable, likely from demand ischemia.  Does not look like acute MI. - echocardiogram is normal, EF 55%, has LVH.  No significant wall motion abnormalities  3.  Acute C. difficile colitis-on oral vancomycin.  Also added probiotics.  Diarrhea persists  4.  Hypernatremia-change fluids to half-normal saline with good results  5.  Parkinson's disease-on Sinemet, slow to respond  6.  DVT prophylaxis- subcu heparin   7.  Malnutrition-extremely poor oral intake.  Family discussion with palliative care for further goals of care  Appreciate palliative care input.  Would benefit from hospice services at home.  Physical therapy consult requested   All the records are reviewed and case discussed with Care Management/Social Workerr. Management plans discussed with the patient, family and they are in agreement.  CODE STATUS: DNR  TOTAL TIME TAKING CARE OF THIS PATIENT: 37 minutes.   POSSIBLE D/C IN 1-2 DAYS, DEPENDING ON CLINICAL CONDITION.   Gladstone Lighter M.D on 09/23/2018 at 10:50 AM  Between 7am to 6pm - Pager - [phone]  After 6pm go to [email or web address] - password [email or web address] [facility]  Office  [phone]  CC: Primary care physician; Rusty Aus, MD

## 2018-09-23 NOTE — Plan of Care (Signed)
  Problem: Clinical Measurements: Goal: Ability to maintain clinical measurements within normal limits will improve Outcome: Progressing Goal: Diagnostic test results will improve Outcome: Progressing   Problem: Pain Managment: Goal: General experience of comfort will improve Outcome: Progressing   Problem: Safety: Goal: Ability to remain free from injury will improve Outcome: Progressing   Problem: Elimination: Goal: Will not experience complications related to bowel motility Outcome: Not Progressing Note: Patient continues to have loose stools.

## 2018-09-24 LAB — CULTURE, BLOOD (ROUTINE X 2)
Culture: NO GROWTH
Culture: NO GROWTH
Special Requests: ADEQUATE

## 2018-09-24 LAB — GLUCOSE, CAPILLARY
Glucose-Capillary: 143 mg/dL — ABNORMAL HIGH (ref 70–99)
Glucose-Capillary: 145 mg/dL — ABNORMAL HIGH (ref 70–99)
Glucose-Capillary: 96 mg/dL (ref 70–99)
Glucose-Capillary: 105 mg/dL — ABNORMAL HIGH (ref 70–99)

## 2018-09-24 LAB — BASIC METABOLIC PANEL
Anion gap: 6 (ref 5–15)
BUN: 62 mg/dL — ABNORMAL HIGH (ref 8–23)
CO2: 23 mmol/L (ref 22–32)
Calcium: 7.8 mg/dL — ABNORMAL LOW (ref 8.9–10.3)
Chloride: 109 mmol/L (ref 98–111)
Creatinine, Ser: 1.59 mg/dL — ABNORMAL HIGH (ref 0.44–1.00)
GFR calc Af Amer: 33 mL/min — ABNORMAL LOW (ref >60)
GFR calc non Af Amer: 29 mL/min — ABNORMAL LOW (ref >60)
Glucose, Bld: 141 mg/dL — ABNORMAL HIGH (ref 70–99)
Potassium: 3.5 mmol/L (ref 3.5–5.1)
Sodium: 138 mmol/L (ref 135–145)

## 2018-09-24 NOTE — TOC Progression Note (Addendum)
Transition of Care [facility]) - Progression Note    Patient Details  Name: Michelle Beck MRN: [id number] Date of Birth: 01-02-1930  Transition of Care [facility] CM/SW Contact  Ross Ludwig, [place] Phone Number: 09/24/2018, 5:09 PM  Clinical Narrative:     CSW spoke with patient's daughter, she is still planning on having patient go home with hospice.  Patient's daughter stated they will need a hospital bed.  CSW updated [place] at [place], she said the hospital bed will not be able to be delivered till Monday because the DME equipment is closed for the holiday weekend.  CSW updated physician, and also left a message on patient's daughter's phone.  Plan is for patient to discharge home with hospice services.  Patient's daughter said she will need the equipment delivered to [facility], [place], [place], [address].  CSW to continue to follow patient's progress throughout discharge planning.      Expected Discharge Plan and Services  Plan is to have patient go home with hospice, patient's daughter requested a hospital bed, CSW notified [facility] of patient's daughter's request.                    Social Determinants of Health (SDOH) Interventions    Readmission Risk Interventions No flowsheet data found.

## 2018-09-24 NOTE — NC FL2 (Signed)
[place] LEVEL OF CARE SCREENING TOOL     IDENTIFICATION  Patient Name: Michelle Beck Birthdate: 1929-06-28 Sex: female Admission Date (Current Location): 09/19/2018  [place] and [place] Number:  Engineering geologist and Address:  [facility], [address], [place], [place] [address]      Provider Number: [id number]  Attending Physician Name and Address:  Gladstone Lighter, MD  Relative Name and Phone Number:  Anaira, Seay Spouse [phone]  [phone]    Current Level of Care: Hospital Recommended Level of Care: [place] Prior Approval Number:    Date Approved/Denied:   PASRR Number: [id number]  Discharge Plan: SNF    Current Diagnoses: Patient Active Problem List   Diagnosis Date Noted  . Pressure injury of skin 09/20/2018  . HTN (hypertension) 09/19/2018  . GERD (gastroesophageal reflux disease) 09/19/2018  . NSTEMI (non-ST elevated myocardial infarction) ([place]) 09/19/2018  . Goals of care, counseling/discussion   . Palliative care by specialist   . DNR (do not resuscitate) discussion   . AMS (altered mental status) 09/14/2018  . Parkinson's disease ([place]) 08/24/2012    Orientation RESPIRATION BLADDER Height & Weight     Self  O2(2L) Continent Weight: 146 lb 3.2 oz (66.3 kg) Height:  5\' 4"  (162.6 cm)  BEHAVIORAL SYMPTOMS/MOOD NEUROLOGICAL BOWEL NUTRITION STATUS      Incontinent Diet(Dysphagia 1 diet)  AMBULATORY STATUS COMMUNICATION OF NEEDS Skin   Extensive Assist Verbally PU Stage and Appropriate Care PU Stage 1 Dressing: (Every 3 days) PU Stage 2 Dressing: (Every 3 days)                   Personal Care Assistance Level of Assistance  Bathing, Feeding, Dressing Bathing Assistance: Limited assistance Feeding assistance: Limited assistance Dressing Assistance: Limited assistance     Functional Limitations Info  Sight, Hearing, Speech Sight Info: Adequate Hearing Info:  Adequate Speech Info: Adequate    SPECIAL CARE FACTORS FREQUENCY  PT (By licensed PT), OT (By licensed OT)     PT Frequency: Minimum 5x a week OT Frequency: Minimum 5x a week            Contractures Contractures Info: Not present    Additional Factors Info  Code Status, Allergies, Insulin Sliding Scale, Isolation Precautions Code Status Info: DNR Allergies Info: Fentanyl Actonel (Risedronate Sodium)  Hydrocodone Iodine Shellfish Allergy Penicillins   Insulin Sliding Scale Info: insulin aspart (novoLOG) injection 0-9 Units 3x a day with meals Isolation Precautions Info: Enteric Precautions     Current Medications (09/24/2018):  This is the current hospital active medication list Current Facility-Administered Medications  Medication Dose Route Frequency Provider Last Rate Last Dose  . acetaminophen (TYLENOL) tablet 650 mg  650 mg Oral Q6H PRN Lance Coon, MD       Or  . acetaminophen (TYLENOL) suppository 650 mg  650 mg Rectal Q6H PRN Lance Coon, MD      . acidophilus (RISAQUAD) capsule 1 capsule  1 capsule Oral Daily Gladstone Lighter, MD   1 capsule at 09/24/18 [phone]  . albumin human 25 % solution 25 g  25 g Intravenous Mee Hives, MD 60 mL/hr at 09/24/18 0613 25 g at 09/24/18 0613  . Carbidopa-Levodopa ER (SINEMET CR) 25-100 MG tablet controlled release 1 tablet  1 tablet Oral QID Lance Coon, MD   1 tablet at 09/24/18 [phone]  . dextrose 5 % solution   Intravenous Continuous Lateef, Munso[place], MD 100 mL/hr at 09/24/18 0242    .  heparin injection 5,000 Units  5,000 Units Subcutaneous Q8H Enid BaasKalisetti, Radhika, MD   5,000 Units at 09/24/18 [phone]  . insulin aspart (novoLOG) injection 0-5 Units  0-5 Units Subcutaneous QHS Enid BaasKalisetti, Radhika, MD   2 Units at 09/22/18 2114  . insulin aspart (novoLOG) injection 0-9 Units  0-9 Units Subcutaneous TID WC Enid BaasKalisetti, Radhika, MD   1 Units at 09/24/18 0834  . metoprolol tartrate (LOPRESSOR) tablet 12.5 mg  12.5 mg Oral Daily  Oralia ManisWillis, David, MD   12.5 mg at 09/24/18 [id number]  . ondansetron (ZOFRAN) tablet 4 mg  4 mg Oral Q6H PRN Oralia ManisWillis, David, MD       Or  . ondansetron [facility](ZOFRAN) injection 4 mg  4 mg Intravenous Q6H PRN Oralia ManisWillis, David, MD      . risperiDONE (RISPERDAL) tablet 0.25 mg  0.25 mg Oral Q6H PRN Enid BaasKalisetti, Radhika, MD      . rOPINIRole (REQUIP) tablet 2 mg  2 mg Oral TID Oralia ManisWillis, David, MD   2 mg at 09/24/18 [id number]  . sodium chloride flush (NS) 0.9 % injection 3 mL  3 mL Intravenous PRN Enid BaasKalisetti, Radhika, MD      . sodium chloride flush (NS) 0.9 % injection 3 mL  3 mL Intravenous Q12H Enid BaasKalisetti, Radhika, MD   3 mL at 09/22/18 2115  . vancomycin (VANCOCIN) 50 mg/mL oral solution 125 mg  125 mg Oral Q6H Enid BaasKalisetti, Radhika, MD   125 mg at 09/24/18 [phone]     Discharge Medications: Please see discharge summary for a list of discharge medications.  Relevant Imaging Results:  Relevant Lab Results:   Additional Information SSN [id number]  Darleene Cleavernterhaus, Arliss Hepburn R, LCSW

## 2018-09-24 NOTE — Progress Notes (Signed)
[facility]  ROUNDING NOTE   Subjective:  Patient seen at bedside. Renal function does appear to be improving his creatinine down to 1.6.   Objective:  Vital signs in last 24 hours:  Temp:  [97.6 F (36.4 C)-98.5 F (36.9 C)] 98.2 F (36.8 C) (07/03 0742) Pulse Rate:  [74-87] 77 (07/03 0742) Resp:  [14-19] 19 (07/03 0742) BP: (113-148)/(44-87) 148/53 (07/03 0742) SpO2:  [98 %-100 %] 100 % (07/03 0742)  Weight change:  Filed Weights   09/19/18 2009 09/21/18 0310  Weight: 72.6 kg 66.3 kg    Intake/Output: I/O last 3 completed shifts: In: 4072.7 [P.O.:812; I.V.:3108.4; IV Piggyback:152.2] Out: 975 [Urine:975]   Intake/Output this shift:  No intake/output data recorded.  Physical Exam: General: No acute distress  Head: Normocephalic, atraumatic. Moist oral mucosal membranes  Eyes: Anicteric  Neck: Supple, trachea midline  Lungs:  Clear to auscultation, normal effort  Heart: S1S2 no rubs  Abdomen:  Soft, nontender, bowel sounds present  Extremities: trace peripheral edema.  Neurologic: Awake, alert, following commands  Skin: No lesions       Basic Metabolic Panel: Recent Labs  Lab 09/20/18 0408 09/21/18 0512 09/22/18 0415 09/23/18 0651 09/24/18 0709  NA 143 144 150* 143 138  K 4.2 4.5 4.3 3.6 3.5  CL 107 111 118* 113* 109  CO2 25 22 20* 21* 23  GLUCOSE 120* 112* 95 130* 141*  BUN 58* 75* 84* 80* 62*  CREATININE 2.91* 4.33* 3.50* 2.31* 1.59*  CALCIUM 9.0 8.1* 8.0* 7.6* 7.8*    Liver Function Tests: Recent Labs  Lab 09/19/18 2023  AST 41  ALT 9  ALKPHOS 70  BILITOT 0.5  PROT 5.6*  ALBUMIN 2.8*   No results for input(s): LIPASE, AMYLASE in the last 168 hours. No results for input(s): AMMONIA in the last 168 hours.  CBC: Recent Labs  Lab 09/19/18 2023 09/20/18 0408 09/22/18 0415 09/23/18 0651  WBC 9.9 11.1* 7.1 6.4  HGB 12.6 12.7 10.6* 10.3*  HCT 38.5 39.2 33.5* 31.5*  MCV 91.9 92.7 93.6 91.8  PLT 128* 145* 182 185     Cardiac Enzymes: No results for input(s): CKTOTAL, CKMB, CKMBINDEX, TROPONINI in the last 168 hours.  BNP: Invalid input(s): POCBNP  CBG: Recent Labs  Lab 09/23/18 1208 09/23/18 1701 09/23/18 2135 09/24/18 0739 09/24/18 1135  GLUCAP 144* 207* 171* 143* 145*    Microbiology: Results for orders placed or performed during the hospital encounter of 09/19/18  Blood culture (routine x 2)     Status: None   Collection Time: 09/19/18  8:23 PM   Specimen: BLOOD  Result Value Ref Range Status   Specimen Description BLOOD LEFT AC  Final   Special Requests   Final    BOTTLES DRAWN AEROBIC AND ANAEROBIC Blood Culture results may not be optimal due to an excessive volume of blood received in culture bottles   Culture   Final    NO GROWTH 5 DAYS Performed at [facility], [address] [place], [place] [address]    Report Status 09/24/2018 FINAL  Final  Blood culture (routine x 2)     Status: None   Collection Time: 09/19/18  8:23 PM   Specimen: BLOOD  Result Value Ref Range Status   Specimen Description BLOOD LEFT HAND  Final   Special Requests   Final    BOTTLES DRAWN AEROBIC AND ANAEROBIC Blood Culture adequate volume   Culture   Final    NO GROWTH 5 DAYS  Performed at [facility], [address] [place], [place] [address]    Report Status 09/24/2018 FINAL  Final  SARS Coronavirus 2 (CEPHEID - Performed in [place] hospital lab), Hosp Order     Status: None   Collection Time: 09/19/18  8:23 PM   Specimen: Nasopharyngeal Swab  Result Value Ref Range Status   SARS Coronavirus 2 NEGATIVE NEGATIVE Final    Comment: (NOTE) If result is NEGATIVE SARS-CoV-2 target nucleic acids are NOT DETECTED. The SARS-CoV-2 RNA is generally detectable in upper and lower  respiratory specimens during the acute phase of infection. The lowest  concentration of SARS-CoV-2 viral copies this assay can detect is 250  copies / mL. A negative result does not preclude  SARS-CoV-2 infection  and should not be used as the sole basis for treatment or other  patient management decisions.  A negative result may occur with  improper specimen collection / handling, submission of specimen other  than nasopharyngeal swab, presence of viral mutation(s) within the  areas targeted by this assay, and inadequate number of viral copies  (<250 copies / mL). A negative result must be combined with clinical  observations, patient history, and epidemiological information. If result is POSITIVE SARS-CoV-2 target nucleic acids are DETECTED. The SARS-CoV-2 RNA is generally detectable in upper and lower  respiratory specimens dur ing the acute phase of infection.  Positive  results are indicative of active infection with SARS-CoV-2.  Clinical  correlation with patient history and other diagnostic information is  necessary to determine patient infection status.  Positive results do  not rule out bacterial infection or co-infection with other viruses. If result is PRESUMPTIVE POSTIVE SARS-CoV-2 nucleic acids MAY BE PRESENT.   A presumptive positive result was obtained on the submitted specimen  and confirmed on repeat testing.  While 2019 novel coronavirus  (SARS-CoV-2) nucleic acids may be present in the submitted sample  additional confirmatory testing may be necessary for epidemiological  and / or clinical management purposes  to differentiate between  SARS-CoV-2 and other Sarbecovirus currently known to infect humans.  If clinically indicated additional testing with an alternate test  methodology [phone]) is advised. The SARS-CoV-2 RNA is generally  detectable in upper and lower respiratory sp ecimens during the acute  phase of infection. The expected result is Negative. Fact Sheet for Patients:  StrictlyIdeas.no Fact Sheet for Healthcare Providers: BankingDealers.co.za This test is not yet approved or cleared by the  [place] FDA and has been authorized for detection and/or diagnosis of SARS-CoV-2 by FDA under an Emergency Use Authorization (EUA).  This EUA will remain in effect (meaning this test can be used) for the duration of the COVID-19 declaration under Section 564(b)(1) of the Act, 21 U.S.C. section 360bbb-3(b)(1), unless the authorization is terminated or revoked sooner. Performed at [facility], [address] [place], [place] [address]   C difficile quick scan w PCR reflex     Status: Abnormal   Collection Time: 09/20/18  9:13 AM   Specimen: STOOL  Result Value Ref Range Status   C Diff antigen POSITIVE (A) NEGATIVE Final   C Diff toxin POSITIVE (A) NEGATIVE Final   C Diff interpretation Toxin producing C. difficile detected.  Final    Comment: CRITICAL RESULT CALLED TO, READ BACK BY AND VERIFIED WITH:  Caleen Jobs AT 6195 09/20/2018 SDR Performed at [facility], [address], [place], [place] [address]   Gastrointestinal Panel by PCR , Stool  Status: None   Collection Time: 09/20/18  9:13 AM   Specimen: STOOL  Result Value Ref Range Status   Campylobacter species NOT DETECTED NOT DETECTED Final   Plesimonas shigelloides NOT DETECTED NOT DETECTED Final   Salmonella species NOT DETECTED NOT DETECTED Final   Yersinia enterocolitica NOT DETECTED NOT DETECTED Final   Vibrio species NOT DETECTED NOT DETECTED Final   Vibrio cholerae NOT DETECTED NOT DETECTED Final   Enteroaggregative E coli (EAEC) NOT DETECTED NOT DETECTED Final   Enteropathogenic E coli (EPEC) NOT DETECTED NOT DETECTED Final   Enterotoxigenic E coli (ETEC) NOT DETECTED NOT DETECTED Final   Shiga like toxin producing E coli (STEC) NOT DETECTED NOT DETECTED Final   Shigella/Enteroinvasive E coli (EIEC) NOT DETECTED NOT DETECTED Final   Cryptosporidium NOT DETECTED NOT DETECTED Final   Cyclospora cayetanensis NOT DETECTED NOT DETECTED Final   Entamoeba histolytica NOT DETECTED NOT DETECTED  Final   Giardia lamblia NOT DETECTED NOT DETECTED Final   Adenovirus F40/41 NOT DETECTED NOT DETECTED Final   Astrovirus NOT DETECTED NOT DETECTED Final   Norovirus GI/GII NOT DETECTED NOT DETECTED Final   Rotavirus A NOT DETECTED NOT DETECTED Final   Sapovirus (I, II, IV, and V) NOT DETECTED NOT DETECTED Final    Comment: Performed at [facility], [address] [place], [place] [address]    Coagulation Studies: No results for input(s): LABPROT, INR in the last 72 hours.  Urinalysis: No results for input(s): COLORURINE, LABSPEC, PHURINE, GLUCOSEU, HGBUR, BILIRUBINUR, KETONESUR, PROTEINUR, UROBILINOGEN, NITRITE, LEUKOCYTESUR in the last 72 hours.  Invalid input(s): APPERANCEUR    Imaging: No results found.   Medications:   . albumin human 25 g (09/24/18 [id number])   . acidophilus  1 capsule Oral Daily  . Carbidopa-Levodopa ER  1 tablet Oral QID  . heparin injection (subcutaneous)  5,000 Units Subcutaneous Q8H  . insulin aspart  0-5 Units Subcutaneous QHS  . insulin aspart  0-9 Units Subcutaneous TID WC  . metoprolol tartrate  12.5 mg Oral Daily  . rOPINIRole  2 mg Oral TID  . sodium chloride flush  3 mL Intravenous Q12H  . vancomycin  125 mg Oral Q6H   acetaminophen **OR** acetaminophen, ondansetron **OR** ondansetron (ZOFRAN) IV, risperiDONE, sodium chloride flush  Assessment/ Plan:  85 y.o. female with a PMHx of osteoarthritis, GERD, hypertension, osteoporosis, Parkinson's, prior history of pneumonia, who was [facility] to [facility] 09/19/2018 for evaluation of loss of consciousness.  1.  Acute renal failure. 2.  Proteinuria. 3.  Hypernatremia. 4.  Metabolic acidosis.  Plan: IV fluid hydration has been stopped.  BUN currently 62 with a creatinine of 1.59.  Continue to monitor renal parameters daily for now.  Patient to be continued on vancomycin for treatment of C. difficile colitis.  Otherwise management as per hospitalist.   LOS: 5 Kamin Niblack 7/3/20202:13 PM

## 2018-09-24 NOTE — Progress Notes (Signed)
[employer] [facility] [place] [facility]   PATIENT NAME: Michelle Beck    MR#:  [id number]  DATE OF BIRTH:  13-Mar-1930  SUBJECTIVE:  CHIEF COMPLAINT:   Chief Complaint  Patient presents with  . Loss of Consciousness   -More alert and communicating today.  Slow to respond given underlying Parkinson's disease.  Daughter at bedside -Diarrhea is improved  REVIEW OF SYSTEMS:  Review of Systems  Constitutional: Positive for malaise/fatigue. Negative for chills and fever.  HENT: Positive for hearing loss. Negative for congestion, ear discharge and nosebleeds.   Eyes: Negative for blurred vision and double vision.  Respiratory: Negative for cough, shortness of breath and wheezing.   Cardiovascular: Negative for chest pain and palpitations.  Gastrointestinal: Positive for diarrhea. Negative for abdominal pain, constipation, nausea and vomiting.  Genitourinary: Negative for dysuria.  Musculoskeletal: Negative for myalgias.  Neurological: Negative for dizziness, focal weakness, seizures, weakness and headaches.  Psychiatric/Behavioral: Negative for depression.    DRUG ALLERGIES:   Allergies  Allergen Reactions  . Fentanyl Anxiety and Shortness Of Breath  . Actonel [Risedronate Sodium] Other (See Comments)    Reaction unknown  . Hydrocodone Other (See Comments)    Reaction unknown  . Iodine Other (See Comments)    Unknown  . Shellfish Allergy Other (See Comments)    Reaction unknown  . Penicillins Rash and Other (See Comments)    Has patient had a PCN reaction causing immediate rash, facial/tongue/throat swelling, SOB or lightheadedness with hypotension: Yes Has patient had a PCN reaction causing severe rash involving mucus membranes or skin necrosis: No Has patient had a PCN reaction that required hospitalization: No Has patient had a PCN reaction occurring within the last 10 years: No If all of the above answers are "NO", then may proceed with Cephalosporin use.      VITALS:  Blood pressure (!) 148/53, pulse 77, temperature 98.2 F (36.8 C), resp. rate 19, height 5\' 4"  (1.626 m), weight 66.3 kg, SpO2 100 %.  PHYSICAL EXAMINATION:  Physical Exam  GENERAL:  85 y.o.-year-old elderly frail-appearing patient lying in the bed with no acute distress.  EYES: Pupils equal, round, reactive to light and accommodation. No scleral icterus. Extraocular muscles intact.  HEENT: Head atraumatic, normocephalic. Oropharynx and nasopharynx clear.  NECK:  Supple, no jugular venous distention. No thyroid enlargement, no tenderness.  LUNGS: Normal breath sounds bilaterally, no wheezing, rales,rhonchi or crepitation. No use of accessory muscles of respiration.  Decreased bibasilar breath sounds. CARDIOVASCULAR: S1, S2 normal. No  rubs, or gallops.  3/6 systolic murmur is present ABDOMEN: Soft, nontender, nondistended. Bowel sounds present. No organomegaly or mass.  EXTREMITIES: No pedal edema, cyanosis, or clubbing.  Heel boots in place given pressure ulcers. NEUROLOGIC: Alert and able to answer some simple questions.  Communicating, following simple commands today.  Global weakness noted.  Sensation is intact PSYCHIATRIC: The patient is alert and oriented SKIN: No obvious rash, lesion, or ulcer.    LABORATORY PANEL:   CBC Recent Labs  Lab 09/23/18 0651  WBC 6.4  HGB 10.3*  HCT 31.5*  PLT 185   ------------------------------------------------------------------------------------------------------------------  Chemistries  Recent Labs  Lab 09/19/18 2023  09/24/18 0709  NA 142   < > 138  K 4.2   < > 3.5  CL 106   < > 109  CO2 24   < > 23  GLUCOSE 175*   < > 141*  BUN 53*   < > 62*  CREATININE 2.80*   < >  1.59*  CALCIUM 9.3   < > 7.8*  AST 41  --   --   ALT 9  --   --   ALKPHOS 70  --   --   BILITOT 0.5  --   --    < > = values in this interval not displayed.    ------------------------------------------------------------------------------------------------------------------  Cardiac Enzymes No results for input(s): TROPONINI in the last 168 hours. ------------------------------------------------------------------------------------------------------------------  RADIOLOGY:  No results found.  EKG:   Orders placed or performed during the hospital encounter of 09/19/18  . EKG 12-Lead  . EKG 12-Lead    ASSESSMENT AND PLAN:   85 year old female with past medical history significant for arthritis, GERD, Parkinson's, osteoporosis, hypertension was brought in secondary to a syncopal episode.  1.  Acute renal failure-likely prerenal on admission due to diarrhea.  And ATN possibly. -Avoid nephrotoxins.  Creatinine is improving slowly. -has a Foley catheter  Placed for acute urinary retention - maintain strict input and output record -Renal ultrasound is completely normal. -Appreciate nephrology consult.  Received IV fluids, will discontinue today given third spacing and edema. -Encourage oral intake  2.  Acute C. difficile colitis-on oral vancomycin.  Also added probiotics.  Diarrhea improving  3.  Elevated troponin-high-sensitivity troponin levels are stable, likely from demand ischemia.  Does not look like acute MI. - echocardiogram is normal, EF 55%, has LVH.  No significant wall motion abnormalities  4.  Malnutrition and failure to thrive.  Poor oral intake.  Appreciate palliative care input.  Also dietary input appreciated.  On dysphagia diet with nectar thick liquids at this time. -Hypoalbuminemia also causing third spacing.  On IV albumin at this time. -Recommend hospice services at home to avoid recurrent hospitalizations.  5.  Parkinson's disease-on Sinemet, slow to respond  6.  DVT prophylaxis- subcu heparin   Appreciate palliative care input.  Would benefit from hospice services at home.  Physical therapy consult requested  Physical therapy recommended SNF.  But family open to taking her home as they have care services at home  Updated daughter at bedside   All the records are reviewed and case discussed with Care Management/Social Workerr. Management plans discussed with the patient, family and they are in agreement.  CODE STATUS: DNR  TOTAL TIME TAKING CARE OF THIS PATIENT: 39 minutes.   POSSIBLE D/C IN 1-2 DAYS, DEPENDING ON CLINICAL CONDITION.   Gladstone Lighter M.D on 09/24/2018 at 12:59 PM  Between 7am to 6pm - Pager - [phone]  After 6pm go to [email or web address] - password [email or web address] [facility]  Office  [phone]  CC: Primary care physician; Rusty Aus, MD

## 2018-09-24 NOTE — Plan of Care (Signed)
  Problem: Safety: Goal: Ability to remain free from injury will improve Outcome: Progressing   

## 2018-09-25 LAB — BASIC METABOLIC PANEL
Anion gap: 8 (ref 5–15)
BUN: 47 mg/dL — ABNORMAL HIGH (ref 8–23)
CO2: 24 mmol/L (ref 22–32)
Calcium: 8.1 mg/dL — ABNORMAL LOW (ref 8.9–10.3)
Chloride: 111 mmol/L (ref 98–111)
Creatinine, Ser: 1.14 mg/dL — ABNORMAL HIGH (ref 0.44–1.00)
GFR calc Af Amer: 50 mL/min — ABNORMAL LOW (ref >60)
GFR calc non Af Amer: 43 mL/min — ABNORMAL LOW (ref >60)
Glucose, Bld: 107 mg/dL — ABNORMAL HIGH (ref 70–99)
Potassium: 3.4 mmol/L — ABNORMAL LOW (ref 3.5–5.1)
Sodium: 143 mmol/L (ref 135–145)

## 2018-09-25 LAB — GLUCOSE, CAPILLARY
Glucose-Capillary: 101 mg/dL — ABNORMAL HIGH (ref 70–99)
Glucose-Capillary: 126 mg/dL — ABNORMAL HIGH (ref 70–99)
Glucose-Capillary: 128 mg/dL — ABNORMAL HIGH (ref 70–99)
Glucose-Capillary: 113 mg/dL — ABNORMAL HIGH (ref 70–99)

## 2018-09-25 MED ORDER — POTASSIUM CHLORIDE CRYS ER 20 MEQ PO TBCR
40.0000 meq | EXTENDED_RELEASE_TABLET | Freq: Once | ORAL | Status: AC
  Administered 2018-09-25: 40 meq via ORAL
  Filled 2018-09-25: qty 2

## 2018-09-25 MED ORDER — ALBUMIN HUMAN 25 % IV SOLN
25.0000 g | Freq: Three times a day (TID) | INTRAVENOUS | Status: AC
  Administered 2018-09-25 – 2018-09-26 (×4): 25 g via INTRAVENOUS
  Filled 2018-09-25 (×4): qty 100

## 2018-09-25 NOTE — Progress Notes (Signed)
Physical Therapy Treatment Patient Details Name: Michelle Beck MRN: [id number] DOB: 01-Feb-1930 Today's Date: 09/25/2018    History of Present Illness 85 y.o. female with a known history of Parkinson's disease, hypertension, asthma, arthritis, GERD.  She arrived to the emergency room via EMS services for altered mental status, syncopal episode.    PT Comments    Pt seen per MD request this am.  Recommendation for SNF but family plans to take pt home with Hospice services Monday.    Pt in bed with RN and daughter in room.  Upon initiation of sitting EOB, it was noted pt was inc of another large loose BM.  Pt had recently been attended to by staff.  Returned to supine and care was provided.  Mod/max a for rolling left/right and for care.  Pt continued to have BM during care.  To edge of bed with mod/max a x 1.  Once sitting she is able to sit unassisted but requires hands on assist at all times due to inability to self correct any LOB's.  She is able to stand x 2 at EOB with mod a x 2 to stand and min a x 2 once up.  She has post lean and difficulty correcting despite verbal and tactile cues.  While standing BM continues and care is again provided.  She is unable to take effective steps in place and only slightly shifts weight from side to side.  Transfers to recliner are deferred at this time due to continued loose stool.  Anticipate pt would still require max a x 2 stand pivot transfer as in previous session.    Discussed discharge plan with pt and daughter.  They anticipate a hospital bed to be delivered Monday but daughter would also like a lift for home.  Will update recommendations.  Stated they will have 24 hour nursing care in the home. Will also benefit from HHPT as pt was able to walk with a walker prior to decline a few weeks ago.    Of note, on white board in room it stated to be careful of L arm due to fractures.  No note of fractures observed in chart and no recent imaging done.  Daughter  stated she had shoulder fracture "years ago" and did not know why that was on the board.  Stated it does bother her at times but no acute injury or restrictions that they are aware of.  Pt reports no pain with mobility or activity.  Caution given but she was able to stand with walker without pain or complaint.   Follow Up Recommendations  SNF     Equipment Recommendations  Rolling walker with 5" wheels;Other (comment);Hospital bed;Wheelchair (measurements PT);Wheelchair cushion (measurements PT)    Recommendations for Other Services       Precautions / Restrictions Precautions Precautions: Fall Precaution Comments: Nectar Restrictions Weight Bearing Restrictions: No    Mobility  Bed Mobility Overal bed mobility: Needs Assistance Bed Mobility: Rolling;Supine to Sit;Sit to Supine Rolling: Max assist   Supine to sit: Mod assist;Max assist Sit to supine: Max assist;+2 for physical assistance   General bed mobility comments: generally rigid with limited ROM, poor ability to dissociate trunk and extremities  Transfers Overall transfer level: Needs assistance Equipment used: Rolling walker (2 wheeled) Transfers: Sit to/from Stand Sit to Stand: Mod assist;+2 physical assistance         General transfer comment: deferred due to continued loose BM with c-diff during activity  Ambulation/Gait  General Gait Details: unsafe/unable   Social research officer, governmenttairs             Wheelchair Mobility    Modified Rankin (Stroke Patients Only)       Balance Overall balance assessment: Needs assistance Sitting-balance support: No upper extremity supported;Feet supported Sitting balance-Leahy Scale: Poor Sitting balance - Comments: able to sit unsupported today but requires hand on assist at all times for safety   Standing balance support: Bilateral upper extremity supported Standing balance-Leahy Scale: Poor Standing balance comment: improved standing but requires outside  assist due to post lean                            Cognition Arousal/Alertness: Awake/alert Behavior During Therapy: WFL for tasks assessed/performed Overall Cognitive Status: Within Functional Limits for tasks assessed                                        Exercises Other Exercises Other Exercises: inc large loose BM and requires full assist for care and bathing.    General Comments        Pertinent Vitals/Pain Pain Assessment: Faces Faces Pain Scale: Hurts little more Pain Location: general discomfort with mobility Pain Descriptors / Indicators: Aching;Grimacing Pain Intervention(s): Limited activity within patient's tolerance;Monitored during session    Home Living                      Prior Function            PT Goals (current goals can now be found in the care plan section) Progress towards PT goals: Progressing toward goals    Frequency    Min 2X/week      PT Plan Current plan remains appropriate    Co-evaluation              AM-PAC PT "6 Clicks" Mobility   Outcome Measure  Help needed turning from your back to your side while in a flat bed without using bedrails?: Total Help needed moving from lying on your back to sitting on the side of a flat bed without using bedrails?: Total Help needed moving to and from a bed to a chair (including a wheelchair)?: Total Help needed standing up from a chair using your arms (e.g., wheelchair or bedside chair)?: A Lot Help needed to walk in hospital room?: Total Help needed climbing 3-5 steps with a railing? : Total 6 Click Score: 7    End of Session Equipment Utilized During Treatment: Gait belt Activity Tolerance: Patient tolerated treatment well Patient left: in bed;with call bell/phone within reach;with bed alarm set;with family/visitor present Nurse Communication: Other (comment)       Time: 1610-9604: 1109-1147 PT Time Calculation (min) (ACUTE ONLY): 38 min  Charges:   $Therapeutic Activity: 38-52 mins                     Michelle Beck, PTA 09/25/18, 2:28 PM

## 2018-09-25 NOTE — Progress Notes (Signed)
[facility] [place] NAME: Jesenia Spera    MR#:  [id number]  [place]:  1930-01-17  SUBJECTIVE:  CHIEF COMPLAINT:   Chief Complaint  Patient presents with  . Loss of Consciousness   -Diarrhea is improving -Feels stronger.  Still total assist.   REVIEW OF SYSTEMS:  Review of Systems  Constitutional: Positive for malaise/fatigue. Negative for chills and fever.  HENT: Positive for hearing loss. Negative for congestion, ear discharge and nosebleeds.   Eyes: Negative for blurred vision and double vision.  Respiratory: Negative for cough, shortness of breath and wheezing.   Cardiovascular: Negative for chest pain and palpitations.  Gastrointestinal: Positive for diarrhea. Negative for abdominal pain, constipation, nausea and vomiting.  Genitourinary: Negative for dysuria.  Musculoskeletal: Negative for myalgias.  Neurological: Negative for dizziness, focal weakness, seizures, weakness and headaches.  Psychiatric/Behavioral: Negative for depression.    DRUG ALLERGIES:   Allergies  Allergen Reactions  . Fentanyl Anxiety and Shortness Of Breath  . Actonel [Risedronate Sodium] Other (See Comments)    Reaction unknown  . Hydrocodone Other (See Comments)    Reaction unknown  . Iodine Other (See Comments)    Unknown  . Shellfish Allergy Other (See Comments)    Reaction unknown  . Penicillins Rash and Other (See Comments)    Has patient had a PCN reaction causing immediate rash, facial/tongue/throat swelling, SOB or lightheadedness with hypotension: Yes Has patient had a PCN reaction causing severe rash involving mucus membranes or skin necrosis: No Has patient had a PCN reaction that required hospitalization: No Has patient had a PCN reaction occurring within the last 10 years: No If all of the above answers are "NO", then may proceed with Cephalosporin use.     VITALS:  Blood pressure (!) 159/63, pulse 66, temperature (!) 97.5  F (36.4 C), temperature source Oral, resp. rate 18, height 5\' 4"  (1.626 m), weight 72.5 kg, SpO2 99 %.  PHYSICAL EXAMINATION:  Physical Exam  GENERAL:  85 y.o.-year-old elderly frail-appearing patient lying in the bed with no acute distress.  EYES: Pupils equal, round, reactive to light and accommodation. No scleral icterus. Extraocular muscles intact.  HEENT: Head atraumatic, normocephalic. Oropharynx and nasopharynx clear.  NECK:  Supple, no jugular venous distention. No thyroid enlargement, no tenderness.  LUNGS: Normal breath sounds bilaterally, no wheezing, rales,rhonchi or crepitation. No use of accessory muscles of respiration.  Decreased bibasilar breath sounds. CARDIOVASCULAR: S1, S2 normal. No  rubs, or gallops.  3/6 systolic murmur is present ABDOMEN: Soft, nontender, nondistended. Bowel sounds present. No organomegaly or mass.  EXTREMITIES: No pedal edema, cyanosis, or clubbing.  Heel boots in place given pressure ulcers. NEUROLOGIC: Alert and able to answer some simple questions.  Communicating, following simple commands today.  Global weakness noted.  Sensation is intact PSYCHIATRIC: The patient is alert and oriented x2-3, slow to respond SKIN: No obvious rash, lesion, or ulcer.    LABORATORY PANEL:   CBC Recent Labs  Lab 09/23/18 0651  WBC 6.4  HGB 10.3*  HCT 31.5*  PLT 185   ------------------------------------------------------------------------------------------------------------------  Chemistries  Recent Labs  Lab 09/19/18 2023  09/25/18 0537  NA 142   < > 143  K 4.2   < > 3.4*  CL 106   < > 111  CO2 24   < > 24  GLUCOSE 175*   < > 107*  BUN 53*   < > 47*  CREATININE 2.80*   < >  1.14*  CALCIUM 9.3   < > 8.1*  AST 41  --   --   ALT 9  --   --   ALKPHOS 70  --   --   BILITOT 0.5  --   --    < > = values in this interval not displayed.    ------------------------------------------------------------------------------------------------------------------  Cardiac Enzymes No results for input(s): TROPONINI in the last 168 hours. ------------------------------------------------------------------------------------------------------------------  RADIOLOGY:  No results found.  EKG:   Orders placed or performed during the hospital encounter of 09/19/18  . EKG 12-Lead  . EKG 12-Lead    ASSESSMENT AND PLAN:   85 year old female with past medical history significant for arthritis, GERD, Parkinson's, osteoporosis, hypertension was brought in secondary to a syncopal episode.  1.  Acute renal failure-likely prerenal on admission due to diarrhea.  And ATN possibly. -Avoid nephrotoxins.  Creatinine is improving slowly. -has a Foley catheter  Placed for acute urinary retention - maintain strict input and output record -Renal ultrasound is completely normal. -Appreciate nephrology consult.  Improving renal function.  Discontinue IV fluids due to third spacing at this time -Encourage oral intake  2.  Acute C. difficile colitis-on oral vancomycin.  Also added probiotics.  Diarrhea improving  3.  Elevated troponin-high-sensitivity troponin levels are stable, likely from demand ischemia.  Does not look like acute MI. - echocardiogram is normal, EF 55%, has LVH.  No significant wall motion abnormalities  4.  Malnutrition and failure to thrive.  Poor oral intake.  Appreciate palliative care input.  Also dietary input appreciated.  On dysphagia diet with nectar thick liquids at this time. -Hypoalbuminemia also causing third spacing.  On IV albumin at this time.  Can be discontinued by Monday -Recommend hospice services at home to avoid recurrent hospitalizations.  5.  Parkinson's disease-on Sinemet, slow to respond  6.  DVT prophylaxis- subcu heparin   Appreciate palliative care input.  Physical therapy recommended rehab.  Family  interested in taking her home as they have care services at home Encourage hospice follow-up after discharge.  Updated daughter over the phone this morning -Plan to discharge on Monday after the hospital bed is delivered.   All the records are reviewed and case discussed with Care Management/Social Workerr. Management plans discussed with the patient, family and they are in agreement.  CODE STATUS: DNR  TOTAL TIME TAKING CARE OF THIS PATIENT: 37 minutes.   POSSIBLE D/C IN 1-2 DAYS, DEPENDING ON CLINICAL CONDITION.   Enid Baasadhika Jimmey Hengel M.D on 09/25/2018 at 9:01 AM  Between 7am to 6pm - Pager - [phone]  After 6pm go to [email or web address] - Social research officer, governmentpassword EPAS [facility]  [facility]  Office  [phone]  CC: Primary care physician; Danella PentonMiller, Mark F, MD

## 2018-09-25 NOTE — Plan of Care (Signed)
  Problem: Clinical Measurements: Goal: Respiratory complications will improve Outcome: Progressing   Problem: Safety: Goal: Ability to remain free from injury will improve Outcome: Progressing   

## 2018-09-26 LAB — BASIC METABOLIC PANEL
Anion gap: 8 (ref 5–15)
BUN: 34 mg/dL — ABNORMAL HIGH (ref 8–23)
CO2: 24 mmol/L (ref 22–32)
Calcium: 8.7 mg/dL — ABNORMAL LOW (ref 8.9–10.3)
Chloride: 115 mmol/L — ABNORMAL HIGH (ref 98–111)
Creatinine, Ser: 0.84 mg/dL (ref 0.44–1.00)
GFR calc Af Amer: >60 mL/min (ref >60)
GFR calc non Af Amer: >60 mL/min (ref >60)
Glucose, Bld: 114 mg/dL — ABNORMAL HIGH (ref 70–99)
Potassium: 3.8 mmol/L (ref 3.5–5.1)
Sodium: 147 mmol/L — ABNORMAL HIGH (ref 135–145)

## 2018-09-26 LAB — GLUCOSE, CAPILLARY
Glucose-Capillary: 102 mg/dL — ABNORMAL HIGH (ref 70–99)
Glucose-Capillary: 103 mg/dL — ABNORMAL HIGH (ref 70–99)
Glucose-Capillary: 119 mg/dL — ABNORMAL HIGH (ref 70–99)

## 2018-09-26 NOTE — Progress Notes (Signed)
Foley catheter removed and external catheter applied.

## 2018-09-26 NOTE — Plan of Care (Signed)

## 2018-09-26 NOTE — Progress Notes (Signed)
[facility] [place] NAME: Michelle Beck    MR#:  [id number]  [place]:  1929/04/06  SUBJECTIVE:  CHIEF COMPLAINT:   Chief Complaint  Patient presents with  . Loss of Consciousness   -Had several loose stools yesterday - remains alert, renal function is normal - daughter at bedside  REVIEW OF SYSTEMS:  Review of Systems  Constitutional: Positive for malaise/fatigue. Negative for chills and fever.  HENT: Positive for hearing loss. Negative for congestion, ear discharge and nosebleeds.   Eyes: Negative for blurred vision and double vision.  Respiratory: Negative for cough, shortness of breath and wheezing.   Cardiovascular: Negative for chest pain and palpitations.  Gastrointestinal: Positive for diarrhea. Negative for abdominal pain, constipation, nausea and vomiting.  Genitourinary: Negative for dysuria.  Musculoskeletal: Negative for myalgias.  Neurological: Negative for dizziness, focal weakness, seizures, weakness and headaches.  Psychiatric/Behavioral: Negative for depression.    DRUG ALLERGIES:   Allergies  Allergen Reactions  . Fentanyl Anxiety and Shortness Of Breath  . Actonel [Risedronate Sodium] Other (See Comments)    Reaction unknown  . Hydrocodone Other (See Comments)    Reaction unknown  . Iodine Other (See Comments)    Unknown  . Shellfish Allergy Other (See Comments)    Reaction unknown  . Penicillins Rash and Other (See Comments)    Has patient had a PCN reaction causing immediate rash, facial/tongue/throat swelling, SOB or lightheadedness with hypotension: Yes Has patient had a PCN reaction causing severe rash involving mucus membranes or skin necrosis: No Has patient had a PCN reaction that required hospitalization: No Has patient had a PCN reaction occurring within the last 10 years: No If all of the above answers are "NO", then may proceed with Cephalosporin use.     VITALS:  Blood pressure (!)  167/73, pulse 91, temperature 98.2 F (36.8 C), temperature source Oral, resp. rate 19, height 5\' 4"  (1.626 m), weight 69 kg, SpO2 95 %.  PHYSICAL EXAMINATION:  Physical Exam  GENERAL:  85 y.o.-year-old elderly frail-appearing patient lying in the bed with no acute distress.  EYES: Pupils equal, round, reactive to light and accommodation. No scleral icterus. Extraocular muscles intact.  HEENT: Head atraumatic, normocephalic. Oropharynx and nasopharynx clear.  NECK:  Supple, no jugular venous distention. No thyroid enlargement, no tenderness.  LUNGS: Normal breath sounds bilaterally, no wheezing, rales,rhonchi or crepitation. No use of accessory muscles of respiration.  Decreased bibasilar breath sounds. CARDIOVASCULAR: S1, S2 normal. No  rubs, or gallops.  3/6 systolic murmur is present ABDOMEN: Soft, nontender, nondistended. Bowel sounds present. No organomegaly or mass.  EXTREMITIES: No pedal edema, cyanosis, or clubbing.  Heel boots in place given pressure ulcers. NEUROLOGIC: Alert and able to answer some simple questions.  Communicating, following simple commands today.  Global weakness noted.  Sensation is intact PSYCHIATRIC: The patient is alert and oriented x 3, slow to respond. Occasional confusion SKIN: No obvious rash, lesion, or ulcer.    LABORATORY PANEL:   CBC Recent Labs  Lab 09/23/18 0651  WBC 6.4  HGB 10.3*  HCT 31.5*  PLT 185   ------------------------------------------------------------------------------------------------------------------  Chemistries  Recent Labs  Lab 09/19/18 2023  09/26/18 0513  NA 142   < > 147*  K 4.2   < > 3.8  CL 106   < > 115*  CO2 24   < > 24  GLUCOSE 175*   < > 114*  BUN 53*   < >  34*  CREATININE 2.80*   < > 0.84  CALCIUM 9.3   < > 8.7*  AST 41  --   --   ALT 9  --   --   ALKPHOS 70  --   --   BILITOT 0.5  --   --    < > = values in this interval not displayed.    ------------------------------------------------------------------------------------------------------------------  Cardiac Enzymes No results for input(s): TROPONINI in the last 168 hours. ------------------------------------------------------------------------------------------------------------------  RADIOLOGY:  No results found.  EKG:   Orders placed or performed during the hospital encounter of 09/19/18  . EKG 12-Lead  . EKG 12-Lead    ASSESSMENT AND PLAN:   85 year old female with past medical history significant for arthritis, GERD, Parkinson's, osteoporosis, hypertension was brought in secondary to a syncopal episode.  1.  Acute renal failure-likely prerenal on admission due to diarrhea.  And ATN possibly. -Avoid nephrotoxins.  Creatinine is improving slowly. -has a Foley catheter  Placed for acute urinary retention- dc foley today for a voiding trial. - maintain strict input and output record -Renal ultrasound is completely normal. -Appreciate nephrology consult.  Improving renal function.  Discontinued IV fluids due to third spacing at this time -Encourage oral intake  2.  Acute C. difficile colitis-on oral vancomycin.  Also added probiotics.  Diarrhea improving  3.  Elevated troponin-high-sensitivity troponin levels are stable, likely from demand ischemia.  Does not look like acute MI. - echocardiogram is normal, EF 55%, has LVH.  No significant wall motion abnormalities  4.  Malnutrition and failure to thrive.  Poor oral intake.  Appreciate palliative care input.  Also dietary input appreciated.  On dysphagia diet with nectar thick liquids at this time. -Hypoalbuminemia also causing third spacing.  Received IV albumin as well -Recommend hospice services at home to avoid recurrent hospitalizations.  5.  Parkinson's disease-on Sinemet, slow to respond  6.  DVT prophylaxis- subcu heparin   Appreciate palliative care input.  Physical therapy recommended rehab.   Family interested in taking her home as they have care services at home Encourage hospice follow-up after discharge.  Updated daughter at bedside this morning -Plan to discharge on Monday after the hospital bed is delivered.   All the records are reviewed and case discussed with Care Management/Social Workerr. Management plans discussed with the patient, family and they are in agreement.  CODE STATUS: DNR  TOTAL TIME TAKING CARE OF THIS PATIENT: 37 minutes.   POSSIBLE D/C IN 1-2 DAYS, DEPENDING ON CLINICAL CONDITION.   Enid Baasadhika Braden Deloach M.D on 09/26/2018 at 9:42 AM  Between 7am to 6pm - Pager - [phone]  After 6pm go to [email or web address] - Social research officer, governmentpassword EPAS [facility]  [facility]  Office  [phone]  CC: Primary care physician; Danella PentonMiller, Mark F, MD

## 2018-09-27 ENCOUNTER — Other Ambulatory Visit: Payer: Self-pay | Admitting: Neurology

## 2018-09-27 DIAGNOSIS — G20 Parkinson's disease: Secondary | ICD-10-CM

## 2018-09-27 LAB — BASIC METABOLIC PANEL
Anion gap: 9 (ref 5–15)
BUN: 27 mg/dL — ABNORMAL HIGH (ref 8–23)
CO2: 27 mmol/L (ref 22–32)
Calcium: 9 mg/dL (ref 8.9–10.3)
Chloride: 113 mmol/L — ABNORMAL HIGH (ref 98–111)
Creatinine, Ser: 0.79 mg/dL (ref 0.44–1.00)
GFR calc Af Amer: >60 mL/min (ref >60)
GFR calc non Af Amer: >60 mL/min (ref >60)
Glucose, Bld: 111 mg/dL — ABNORMAL HIGH (ref 70–99)
Potassium: 3.7 mmol/L (ref 3.5–5.1)
Sodium: 149 mmol/L — ABNORMAL HIGH (ref 135–145)

## 2018-09-27 LAB — GLUCOSE, CAPILLARY
Glucose-Capillary: 104 mg/dL — ABNORMAL HIGH (ref 70–99)
Glucose-Capillary: 123 mg/dL — ABNORMAL HIGH (ref 70–99)

## 2018-09-27 MED ORDER — VANCOMYCIN 50 MG/ML ORAL SOLUTION: 125.0000 mg | Freq: Four times a day (QID) | ORAL | 0 refills | Status: AC

## 2018-09-27 MED ORDER — VANCOMYCIN HCL 125 MG PO CAPS: 125.0000 mg | ORAL_CAPSULE | Freq: Four times a day (QID) | ORAL | 0 refills | Status: AC

## 2018-09-27 NOTE — Plan of Care (Signed)
  Problem: Clinical Measurements: Goal: Ability to maintain clinical measurements within normal limits will improve Outcome: Adequate for Discharge   Problem: Activity: Goal: Risk for activity intolerance will decrease Outcome: Adequate for Discharge   Problem: Nutrition: Goal: Adequate nutrition will be maintained Outcome: Adequate for Discharge   

## 2018-09-27 NOTE — Progress Notes (Signed)
  Speech Language Pathology Treatment: Dysphagia  Patient Details Name: Michelle Beck MRN: [id number] DOB: 15-May-1929 Today's Date: 09/27/2018 Time: 1191-4782 SLP Time Calculation (min) (ACUTE ONLY): 31 min  Assessment / Plan / Recommendation Clinical Impression  Upon entering room, nsg was assisting pt with her breakfast, pt was sitting up, alert and conversing freely with nsg and SLP. Pt was feeding herself. Pt is remarkably more alert and engaging since last seen by this SLP a week ago.  Pt tolerated 3 tsp's and approximately 3 oz thin liquid via cup given verbal cues for SINGLE SMALL SIPS with no apparent overt s/s aspiration or change in respiratory status. Pt also observed to tolerate tsp's puree well, with mild A-P transit delay due to mild decreased coordination and strength. Pt consumed approximately 25% of her breakfast tray when she repeatedly stated she couldn't eat anymore, that she's "just not hungry." Rec. Upgrade Dysphagia I (puree) diet to include thin liquids. Rec. SINGLE SMALL SIPS, position pt fully upright for all PO intake,  give meds crushed in puree for safer swallowing  and provide frequent oral care to decrease risk of pneumonia.   HPI HPI: Per admitting H&P: "Michelle Beck  is a 85 y.o. female who presents with chief complaint as above.  Patient presents the ED after syncopal episode.  Here she is found to have a significantly elevated troponin.  Her contribution to HPI is limited due to dementia.  EKG has Q waves in inferior leads, with most recent EKG for comparison more than 83 years old it is unclear when these changes developed.  Heparin drip was started and hospitalist were called for admission"      SLP Plan  Continue with current plan of care       Recommendations  Diet recommendations: Dysphagia 1 (puree);Thin liquid Liquids provided via: Cup;No straw Medication Administration: Crushed with puree Supervision: Staff to assist with self feeding Compensations:  Minimize environmental distractions;Slow rate;Small sips/bites Postural Changes and/or Swallow Maneuvers: Seated upright 90 degrees;Upright 30-60 min after meal                Oral Care Recommendations: Oral care QID SLP Visit Diagnosis: Dysphagia, oropharyngeal phase (R13.12) Plan: Continue with current plan of care       GO                Mazen Marcin, MA, CCC-SLP 09/27/2018, 10:04 AM

## 2018-09-27 NOTE — Telephone Encounter (Signed)
I called pt, spoke with pt's husband. Pt's husband will ask pt's daughter to call us back to set up a virtual visit.

## 2018-09-27 NOTE — Progress Notes (Signed)
Made MD aware of pt not wanting to keep cardiac monitor on. Notifed CCMD, pt's heart rate WNL. Order to discontinue telemetry. Will continue to monitor.

## 2018-09-27 NOTE — Plan of Care (Signed)
Problem: Education: Goal: Knowledge of General Education information will improve Description: Including pain rating scale, medication(s)/side effects and non-pharmacologic comfort measures 09/27/2018 1445 by Alen Blew, RN Outcome: Adequate for Discharge 09/27/2018 1444 by Alen Blew, RN Outcome: Adequate for Discharge   Problem: Health Behavior/Discharge Planning: Goal: Ability to manage health-related needs will improve 09/27/2018 1445 by Alen Blew, RN Outcome: Adequate for Discharge 09/27/2018 1444 by Alen Blew, RN Outcome: Adequate for Discharge   Problem: Clinical Measurements: Goal: Ability to maintain clinical measurements within normal limits will improve 09/27/2018 1445 by Alen Blew, RN Outcome: Adequate for Discharge 09/27/2018 1444 by Alen Blew, RN Outcome: Adequate for Discharge Goal: Will remain free from infection 09/27/2018 1445 by Alen Blew, RN Outcome: Adequate for Discharge 09/27/2018 1444 by Alen Blew, RN Outcome: Adequate for Discharge Goal: Diagnostic test results will improve 09/27/2018 1445 by Alen Blew, RN Outcome: Adequate for Discharge 09/27/2018 1444 by Alen Blew, RN Outcome: Adequate for Discharge Goal: Respiratory complications will improve 09/27/2018 1445 by Alen Blew, RN Outcome: Adequate for Discharge 09/27/2018 1444 by Alen Blew, RN Outcome: Adequate for Discharge Goal: Cardiovascular complication will be avoided 09/27/2018 1445 by Alen Blew, RN Outcome: Adequate for Discharge 09/27/2018 1444 by Alen Blew, RN Outcome: Adequate for Discharge   Problem: Activity: Goal: Risk for activity intolerance will decrease 09/27/2018 1445 by Alen Blew, RN Outcome: Adequate for Discharge 09/27/2018 1444 by Alen Blew, RN Outcome: Adequate for Discharge   Problem: Nutrition: Goal: Adequate nutrition will be maintained 09/27/2018 1445 by Alen Blew, RN Outcome:  Adequate for Discharge 09/27/2018 1444 by Alen Blew, RN Outcome: Adequate for Discharge   Problem: Coping: Goal: Level of anxiety will decrease 09/27/2018 1445 by Alen Blew, RN Outcome: Adequate for Discharge 09/27/2018 1444 by Alen Blew, RN Outcome: Adequate for Discharge   Problem: Elimination: Goal: Will not experience complications related to bowel motility 09/27/2018 1445 by Alen Blew, RN Outcome: Adequate for Discharge 09/27/2018 1444 by Alen Blew, RN Outcome: Adequate for Discharge Goal: Will not experience complications related to urinary retention 09/27/2018 1445 by Alen Blew, RN Outcome: Adequate for Discharge 09/27/2018 1444 by Alen Blew, RN Outcome: Adequate for Discharge   Problem: Pain Managment: Goal: General experience of comfort will improve 09/27/2018 1445 by Alen Blew, RN Outcome: Adequate for Discharge 09/27/2018 1444 by Alen Blew, RN Outcome: Adequate for Discharge   Problem: Safety: Goal: Ability to remain free from injury will improve 09/27/2018 1445 by Alen Blew, RN Outcome: Adequate for Discharge 09/27/2018 1444 by Alen Blew, RN Outcome: Adequate for Discharge   Problem: Skin Integrity: Goal: Risk for impaired skin integrity will decrease 09/27/2018 1445 by Alen Blew, RN Outcome: Adequate for Discharge 09/27/2018 1444 by Alen Blew, RN Outcome: Adequate for Discharge   Problem: Education: Goal: Knowledge of disease and its progression will improve 09/27/2018 1445 by Alen Blew, RN Outcome: Adequate for Discharge 09/27/2018 1444 by Alen Blew, RN Outcome: Adequate for Discharge   Problem: Health Behavior/Discharge Planning: Goal: Ability to manage health-related needs will improve 09/27/2018 1445 by Alen Blew, RN Outcome: Adequate for Discharge 09/27/2018 1444 by Alen Blew, RN Outcome: Adequate for Discharge   Problem: Clinical Measurements: Goal:  Complications related to the disease process or treatment will be avoided or minimized 09/27/2018 1445 by Alen Blew, RN Outcome: Adequate for Discharge 09/27/2018 1444  by Jerene DillingJackson, Joshalyn Ancheta D, RN Outcome: Adequate for Discharge Goal: Dialysis access will remain free of complications 09/27/2018 1445 by Jerene DillingJackson, Lydian Chavous D, RN Outcome: Adequate for Discharge 09/27/2018 1444 by Jerene DillingJackson, Simren Popson D, RN Outcome: Adequate for Discharge   Problem: Activity: Goal: Activity intolerance will improve 09/27/2018 1445 by Jerene DillingJackson, Deem Marmol D, RN Outcome: Adequate for Discharge 09/27/2018 1444 by Jerene DillingJackson, Lawson Isabell D, RN Outcome: Adequate for Discharge   Problem: Fluid Volume: Goal: Fluid volume balance will be maintained or improved 09/27/2018 1445 by Jerene DillingJackson, Kaleigha Chamberlin D, RN Outcome: Adequate for Discharge 09/27/2018 1444 by Jerene DillingJackson, Arlyss Weathersby D, RN Outcome: Adequate for Discharge   Problem: Nutritional: Goal: Ability to make appropriate dietary choices will improve 09/27/2018 1445 by Jerene DillingJackson, Kayon Dozier D, RN Outcome: Adequate for Discharge 09/27/2018 1444 by Jerene DillingJackson, Inessa Wardrop D, RN Outcome: Adequate for Discharge   Problem: Respiratory: Goal: Respiratory symptoms related to disease process will be avoided 09/27/2018 1445 by Jerene DillingJackson, Tanaysha Alkins D, RN Outcome: Adequate for Discharge 09/27/2018 1444 by Jerene DillingJackson, Quayshaun Hubbert D, RN Outcome: Adequate for Discharge   Problem: Self-Concept: Goal: Body image disturbance will be avoided or minimized 09/27/2018 1445 by Jerene DillingJackson, Morrison Masser D, RN Outcome: Adequate for Discharge 09/27/2018 1444 by Jerene DillingJackson, Mattox Schorr D, RN Outcome: Adequate for Discharge   Problem: Urinary Elimination: Goal: Progression of disease will be identified and treated 09/27/2018 1445 by Jerene DillingJackson, Rosalind Guido D, RN Outcome: Adequate for Discharge 09/27/2018 1444 by Jerene DillingJackson, Amaury Kuzel D, RN Outcome: Adequate for Discharge   Problem: Acute Rehab PT Goals(only PT should resolve) Goal: Pt Will Go Supine/Side To Sit Outcome: Adequate for Discharge Goal:  Pt Will Go Sit To Supine/Side Outcome: Adequate for Discharge Goal: Patient Will Transfer Sit To/From Stand Outcome: Adequate for Discharge Goal: Pt Will Transfer Bed To Chair/Chair To Bed Outcome: Adequate for Discharge Goal: Pt Will Ambulate Outcome: Adequate for Discharge Goal: Pt/caregiver will Perform Home Exercise Program Outcome: Adequate for Discharge

## 2018-09-27 NOTE — Discharge Summary (Signed)
[facility] NAME: Michelle Beck    MR#:  [id number]  DATE OF BIRTH:  [birth year]  DATE OF ADMISSION:  09/19/2018   ADMITTING PHYSICIAN: Lance Coon, MD  DATE OF DISCHARGE: 09/27/18  PRIMARY CARE PHYSICIAN: Rusty Aus, MD   ADMISSION DIAGNOSIS:  Non-ST elevated myocardial infarction [facility]) [I21.4] Acute renal failure, unspecified acute renal failure type ([place]) [N17.9] Altered mental status, unspecified altered mental status type [R41.82] DISCHARGE DIAGNOSIS:  Principal Problem:   NSTEMI (non-ST elevated myocardial infarction) ([place]) Active Problems:   Parkinson's disease ([place])   HTN (hypertension)   GERD (gastroesophageal reflux disease)   Pressure injury of skin  SECONDARY DIAGNOSIS:   Past Medical History:  Diagnosis Date   Arthritis    Asthma    Closed fracture of three ribs    Closed fracture of unspecified part of neck of femur    Closed fracture of unspecified part of vertebral column without mention of spinal cord injury    Disturbance of salivary secretion    Dysrhythmia    Fall    GERD (gastroesophageal reflux disease)    Hypertension    Osteoporosis, unspecified    Pain in thoracic spine    Paralysis agitans (HCC)    Parkinson's disease (HCC)    Pneumonia    Scoliosis (and kyphoscoliosis), idiopathic    Unspecified adverse effect of unspecified drug, medicinal and biological substance    Unspecified disorder of plasma protein metabolism    HOSPITAL COURSE:   Michelle Beck is an 85 year old female who presented to the ED with a syncopal episode and worsening diarrhea.  In the ED, she had a significantly elevated troponin and EKG showed new Q waves in the inferior leads.  She was admitted for further management.  Elevated troponin- felt to be due to demand ischemia. -ECHO was normal, EF 55%, has LVH.  No significant wall motion abnormalities.  Acute C. difficile colitis- diarrhea greatly  improved -Treated with oral vancomycin x 10 days  Acute renal failure- likely prerenal due to diarrhea.  -Creatinine returned to baseline with IV fluids -Ultrasound was unremarkable  Moderate malnutrition and failure to thrive- patient with poor p.o. intake at home. -Seen by palliative care and the decision was made for patient to be discharged home with hospice  Parkinson's disease-stable -Continue Sinemet  DISCHARGE CONDITIONS:  Acute C. difficile colitis Malnutrition and failure to thrive Parkinson's disease CONSULTS OBTAINED:  Treatment Team:  Anthonette Legato, MD DRUG ALLERGIES:   Allergies  Allergen Reactions   Fentanyl Anxiety and Shortness Of Breath   Actonel [Risedronate Sodium] Other (See Comments)    Reaction unknown   Hydrocodone Other (See Comments)    Reaction unknown   Iodine Other (See Comments)    Unknown   Shellfish Allergy Other (See Comments)    Reaction unknown   Penicillins Rash and Other (See Comments)    Has patient had a PCN reaction causing immediate rash, facial/tongue/throat swelling, SOB or lightheadedness with hypotension: Yes Has patient had a PCN reaction causing severe rash involving mucus membranes or skin necrosis: No Has patient had a PCN reaction that required hospitalization: No Has patient had a PCN reaction occurring within the last 10 years: No If all of the above answers are "NO", then may proceed with Cephalosporin use.    DISCHARGE MEDICATIONS:   Allergies as of 09/27/2018      Reactions   Fentanyl Anxiety, Shortness Of Breath   Actonel [risedronate Sodium] Other (  See Comments)   Reaction unknown   Hydrocodone Other (See Comments)   Reaction unknown   Iodine Other (See Comments)   Unknown   Shellfish Allergy Other (See Comments)   Reaction unknown   Penicillins Rash, Other (See Comments)   Has patient had a PCN reaction causing immediate rash, facial/tongue/throat swelling, SOB or lightheadedness with hypotension:  Yes Has patient had a PCN reaction causing severe rash involving mucus membranes or skin necrosis: No Has patient had a PCN reaction that required hospitalization: No Has patient had a PCN reaction occurring within the last 10 years: No If all of the above answers are "NO", then may proceed with Cephalosporin use.      Medication List    TAKE these medications   Carbidopa-Levodopa ER 25-100 MG tablet controlled release Commonly known as: SINEMET CR Take 1 tablet by mouth 4 (four) times daily. 9:30 AM, 1:30 PM, 5:30 PM, 9:30 PM   Cholecalciferol 25 MCG (1000 UT) capsule Take 1,000 Units by mouth daily.   denosumab 60 MG/ML Soln injection Commonly known as: PROLIA Inject 60 mg into the skin every 6 (six) months. Administer in upper arm, thigh, or abdomen   docusate sodium 50 MG capsule Commonly known as: COLACE Take 50 mg by mouth daily.   ibuprofen 200 MG tablet Commonly known as: ADVIL Take 1 tablet (200 mg total) by mouth every 8 (eight) hours as needed for moderate pain (with food).   metoprolol tartrate 25 MG tablet Commonly known as: LOPRESSOR Take 12.5 mg by mouth daily.   REFRESH OP Place 1 drop into both eyes 2 (two) times daily as needed (for dry eyes).   risperiDONE 1 MG tablet Commonly known as: RisperDAL Take 1 tablet (1 mg total) by mouth at bedtime as needed (agitaiton delirium).   rOPINIRole 2 MG tablet Commonly known as: REQUIP Take 1 tablet (2 mg total) by mouth 3 (three) times daily.   vancomycin 50 mg/mL  oral solution Commonly known as: VANCOCIN Take 2.5 mLs (125 mg total) by mouth every 6 (six) hours for 3 days.            Durable Medical Equipment  (From admission, onward)         Start     Ordered   09/24/18 1257  For home use only DME Hospital bed  Once    Question Answer Comment  Length of Need Lifetime   The above medical condition requires: Patient requires the ability to reposition frequently   Head must be elevated greater  than: 30 degrees   Bed type Semi-electric   Support Surface: Gel Overlay      09/24/18 1256           DISCHARGE INSTRUCTIONS:  1.  Follow-up with PCP in 5 days 2.  Take p.o. vancomycin 4 times daily for 2 more days DIET:  Dysphagia 1 diet DISCHARGE CONDITION:  Stable ACTIVITY:  Activity as tolerated OXYGEN:  Home Oxygen: No.  Oxygen Delivery: room air DISCHARGE LOCATION:  home   If you experience worsening of your admission symptoms, develop shortness of breath, life threatening emergency, suicidal or homicidal thoughts you must seek medical attention immediately by calling 911 or calling your MD immediately  if symptoms less severe.  You Must read complete instructions/literature along with all the possible adverse reactions/side effects for all the Medicines you take and that have been prescribed to you. Take any new Medicines after you have completely understood and accpet all the possible adverse  reactions/side effects.   Please note  You were cared for by a hospitalist during your hospital stay. If you have any questions about your discharge medications or the care you received while you were in the hospital after you are discharged, you can call the unit and asked to speak with the hospitalist on call if the hospitalist that took care of you is not available. Once you are discharged, your primary care physician will handle any further medical issues. Please note that NO REFILLS for any discharge medications will be authorized once you are discharged, as it is imperative that you return to your primary care physician (or establish a relationship with a primary care physician if you do not have one) for your aftercare needs so that they can reassess your need for medications and monitor your lab values.    On the day of Discharge:  VITAL SIGNS:  Blood pressure (!) 179/78, pulse 89, temperature 97.8 F (36.6 C), resp. rate 18, height  (1.626 m), weight 69 kg, SpO2 96  %. PHYSICAL EXAMINATION:  GENERAL:  85 y.o.-year-old patient lying in the bed with no acute distress.  EYES: Pupils equal, round, reactive to light and accommodation. No scleral icterus. Extraocular muscles intact.  HEENT: Head atraumatic, normocephalic. Oropharynx and nasopharynx clear.  NECK:  Supple, no jugular venous distention. No thyroid enlargement, no tenderness.  LUNGS: Normal breath sounds bilaterally, no wheezing, rales,rhonchi or crepitation. No use of accessory muscles of respiration.  CARDIOVASCULAR: S1, S2 normal. No murmurs, rubs, or gallops.  ABDOMEN: Soft, non-tender, non-distended. Bowel sounds present. No organomegaly or mass.  EXTREMITIES: No pedal edema, cyanosis, or clubbing.  NEUROLOGIC: Cranial nerves II through XII are intact. +global weakness. Sensation intact. Gait not checked.  PSYCHIATRIC: The patient is alert and oriented x 2.  SKIN: No obvious rash, lesion, or ulcer.  DATA REVIEW:   CBC Recent Labs  Lab 09/23/18 0651  WBC 6.4  HGB 10.3*  HCT 31.5*  PLT 185    Chemistries  Recent Labs  Lab 09/27/18 0510  NA 149*  K 3.7  CL 113*  CO2 27  GLUCOSE 111*  BUN 27*  CREATININE 0.79  CALCIUM 9.0     Microbiology Results  Results for orders placed or performed during the hospital encounter of 09/19/18  Blood culture (routine x 2)     Status: None   Collection Time: 09/19/18  8:23 PM   Specimen: BLOOD  Result Value Ref Range Status   Specimen Description BLOOD LEFT AC  Final   Special Requests   Final    BOTTLES DRAWN AEROBIC AND ANAEROBIC Blood Culture results may not be optimal due to an excessive volume of blood received in culture bottles   Culture   Final    NO GROWTH 5 DAYS Performed at [facility], [address] [place], [place] [address]    Report Status 09/24/2018 FINAL  Final  Blood culture (routine x 2)     Status: None   Collection Time: 09/19/18  8:23 PM   Specimen: BLOOD  Result Value Ref Range Status   Specimen  Description BLOOD LEFT HAND  Final   Special Requests   Final    BOTTLES DRAWN AEROBIC AND ANAEROBIC Blood Culture adequate volume   Culture   Final    NO GROWTH 5 DAYS Performed at [facility], [address] [place], [place] [address]    Report Status 09/24/2018 FINAL  Final  SARS Coronavirus 2 (CEPHEID - Performed  in [facility] hospital lab), Hosp Order     Status: None   Collection Time: 09/19/18  8:23 PM   Specimen: Nasopharyngeal Swab  Result Value Ref Range Status   SARS Coronavirus 2 NEGATIVE NEGATIVE Final    Comment: (NOTE) If result is NEGATIVE SARS-CoV-2 target nucleic acids are NOT DETECTED. The SARS-CoV-2 RNA is generally detectable in upper and lower  respiratory specimens during the acute phase of infection. The lowest  concentration of SARS-CoV-2 viral copies this assay can detect is 250  copies / mL. A negative result does not preclude SARS-CoV-2 infection  and should not be used as the sole basis for treatment or other  patient management decisions.  A negative result may occur with  improper specimen collection / handling, submission of specimen other  than nasopharyngeal swab, presence of viral mutation(s) within the  areas targeted by this assay, and inadequate number of viral copies  (<250 copies / mL). A negative result must be combined with clinical  observations, patient history, and epidemiological information. If result is POSITIVE SARS-CoV-2 target nucleic acids are DETECTED. The SARS-CoV-2 RNA is generally detectable in upper and lower  respiratory specimens dur ing the acute phase of infection.  Positive  results are indicative of active infection with SARS-CoV-2.  Clinical  correlation with patient history and other diagnostic information is  necessary to determine patient infection status.  Positive results do  not rule out bacterial infection or co-infection with other viruses. If result is PRESUMPTIVE POSTIVE SARS-CoV-2 nucleic  acids MAY BE PRESENT.   A presumptive positive result was obtained on the submitted specimen  and confirmed on repeat testing.  While 2019 novel coronavirus  (SARS-CoV-2) nucleic acids may be present in the submitted sample  additional confirmatory testing may be necessary for epidemiological  and / or clinical management purposes  to differentiate between  SARS-CoV-2 and other Sarbecovirus currently known to infect humans.  If clinically indicated additional testing with an alternate test  methodology [phone]([id number]) is advised. The SARS-CoV-2 RNA is generally  detectable in upper and lower respiratory sp ecimens during the acute  phase of infection. The expected result is Negative. Fact Sheet for Patients:  BoilerBrush.com.cyhttps://www.fda.gov/media/136312/download Fact Sheet for Healthcare Providers: https://pope.com/https://www.fda.gov/media/136313/download This test is not yet approved or cleared by the [place] FDA and has been authorized for detection and/or diagnosis of SARS-CoV-2 by FDA under an Emergency Use Authorization (EUA).  This EUA will remain in effect (meaning this test can be used) for the duration of the COVID-19 declaration under Section 564(b)(1) of the Act, 21 U.S.C. section 360bbb-3(b)(1), unless the authorization is terminated or revoked sooner. Performed at [facility], [address] [place], [place] [address]   C difficile quick scan w PCR reflex     Status: Abnormal   Collection Time: 09/20/18  9:13 AM   Specimen: STOOL  Result Value Ref Range Status   C Diff antigen POSITIVE (A) NEGATIVE Final   C Diff toxin POSITIVE (A) NEGATIVE Final   C Diff interpretation Toxin producing C. difficile detected.  Final    Comment: CRITICAL RESULT CALLED TO, READ BACK BY AND VERIFIED WITH:  Hyman HopesAYLOR BECK AT 1052 09/20/2018 SDR Performed at [facility], [address] [place], [place] [address]   Gastrointestinal Panel by PCR , Stool     Status: None   Collection Time: 09/20/18   9:13 AM   Specimen: STOOL  Result Value Ref Range Status   Campylobacter species NOT DETECTED NOT DETECTED  Final   Plesimonas shigelloides NOT DETECTED NOT DETECTED Final   Salmonella species NOT DETECTED NOT DETECTED Final   Yersinia enterocolitica NOT DETECTED NOT DETECTED Final   Vibrio species NOT DETECTED NOT DETECTED Final   Vibrio cholerae NOT DETECTED NOT DETECTED Final   Enteroaggregative E coli (EAEC) NOT DETECTED NOT DETECTED Final   Enteropathogenic E coli (EPEC) NOT DETECTED NOT DETECTED Final   Enterotoxigenic E coli (ETEC) NOT DETECTED NOT DETECTED Final   Shiga like toxin producing E coli (STEC) NOT DETECTED NOT DETECTED Final   Shigella/Enteroinvasive E coli (EIEC) NOT DETECTED NOT DETECTED Final   Cryptosporidium NOT DETECTED NOT DETECTED Final   Cyclospora cayetanensis NOT DETECTED NOT DETECTED Final   Entamoeba histolytica NOT DETECTED NOT DETECTED Final   Giardia lamblia NOT DETECTED NOT DETECTED Final   Adenovirus F40/41 NOT DETECTED NOT DETECTED Final   Astrovirus NOT DETECTED NOT DETECTED Final   Norovirus GI/GII NOT DETECTED NOT DETECTED Final   Rotavirus A NOT DETECTED NOT DETECTED Final   Sapovirus (I, II, IV, and V) NOT DETECTED NOT DETECTED Final    Comment: Performed at [facility], [address] [place], [place] [address]    RADIOLOGY:  No results found.   Management plans discussed with the patient, family and they are in agreement.  CODE STATUS: DNR   TOTAL TIME TAKING CARE OF THIS PATIENT: 40 minutes.    Jinny BlossomKaty D Jyssica Rief M.D on 09/27/2018 at 9:17 AM  Between 7am to 6pm - Pager - [phone]  After 6pm go to [email or web address] - Social research officer, governmentpassword EPAS [email or web address]  [facility]  Office  [phone]  CC: Primary care physician; Danella PentonMiller, Mark F, MD   Note: This dictation was prepared with Dragon dictation along with smaller phrase technology. Any transcriptional errors that result from this process are unintentional.

## 2018-09-27 NOTE — Discharge Instructions (Signed)
It was so nice to meet you during this hospitalization!  You have a bacterial infection of your colon called C. Diff. We are treating you with an antibiotic called Vancomycin to treat this infection. Please take Vancomycin 125mg  4 times a day, for 2 doses today and then continuing for the next 2 days.  Take care, Dr. Brett Albino

## 2018-09-27 NOTE — Progress Notes (Signed)
Patient discharged home via EMS, daughter Gregary Signs called and notified of patient departure, as well as husband. AVS in packet.

## 2018-09-27 NOTE — TOC Transition Note (Signed)
Transition of Care [facility]) - CM/SW Discharge Note   Patient Details  Name: Michelle Beck MRN: [id number] Date of Birth: December 23, 1929  [facility] CM/SW Contact:  Ross Ludwig, LCSW Phone Number: 09/27/2018, 5:28 PM   Clinical Narrative:     Patient discharged home with hospice services through [facility].   Final next level of care: Home w Hospice Care Barriers to Discharge: No Barriers Identified   Patient Goals and CMS Choice Patient states their goals for this hospitalization and ongoing recovery are:: To return back home with hospice. CMS Medicare.gov Compare Post Acute Care list provided to:: Patient Represenative (must comment) Choice offered to / list presented to : Adult Children  Discharge Placement  Discharge back home with hospice services.                     Discharge Plan and Services                DME Arranged: Hospital bed DME Agency: Other - Comment([facility]) Date DME Agency Contacted: 09/27/18 Time DME Agency Contacted: 1100 Representative spoke with at DME Agency: [place]: RN, Nurse's Aide [place] Agency: [facility] Date [place]: 09/27/18 Time [place]: 77 Representative spoke with at [place]: [place] ([place]) Interventions     Readmission Risk Interventions No flowsheet data found.

## 2018-09-29 NOTE — Telephone Encounter (Signed)
I called pt, spoke with pt's husband. He reports that they will call us to set up a VV. He also declined starting nuplazid at this time.

## 2018-09-30 ENCOUNTER — Other Ambulatory Visit: Payer: Self-pay | Admitting: Neurology

## 2018-09-30 DIAGNOSIS — G20 Parkinson's disease: Secondary | ICD-10-CM

## 2018-09-30 DIAGNOSIS — G20A1 Parkinson's disease without dyskinesia, without mention of fluctuations: Secondary | ICD-10-CM

## 2019-01-23 DEATH — deceased

## 2020-12-28 IMAGING — CT CT HEAD WITHOUT CONTRAST
3 series · 16 of 47 positions shown, 19 images · non-contrast
Comparison: MRI 09/15/2018, CT 09/14/2018

CLINICAL DATA: Syncope, altered level of consciousness.

EXAM:
CT HEAD WITHOUT CONTRAST
TECHNIQUE: Contiguous axial images were obtained from the base of the skull
through the vertex without intravenous contrast.

[Series 3: head wo · axial · 0.39mm/px · z∈[-115,+10]mm · 10 of 30 slices shown, 13 images]
[im 3/30  brain]
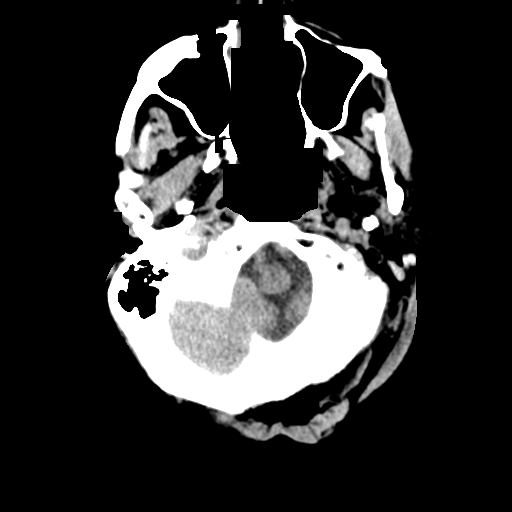
[im 3/30  bone]
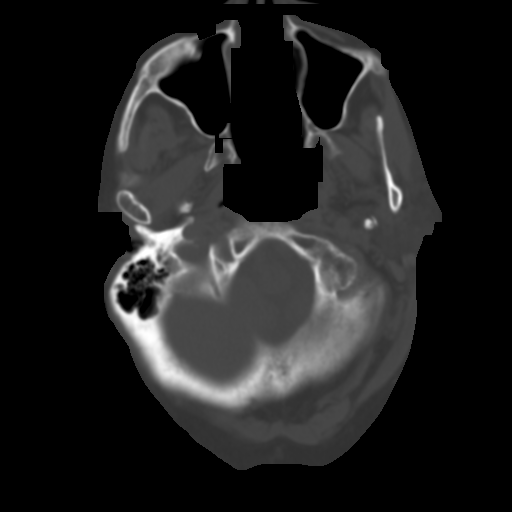
[im 6/30  brain]
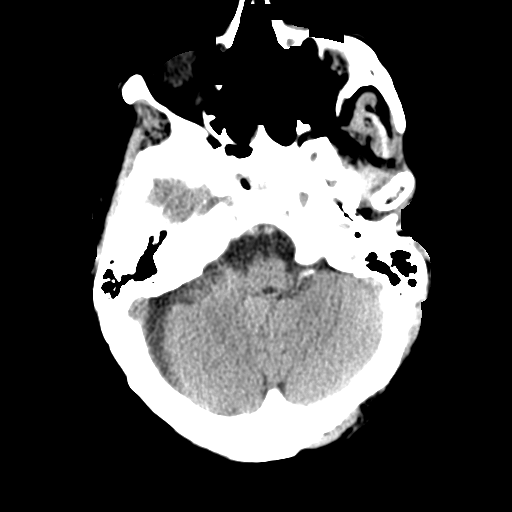
[im 9/30  brain]
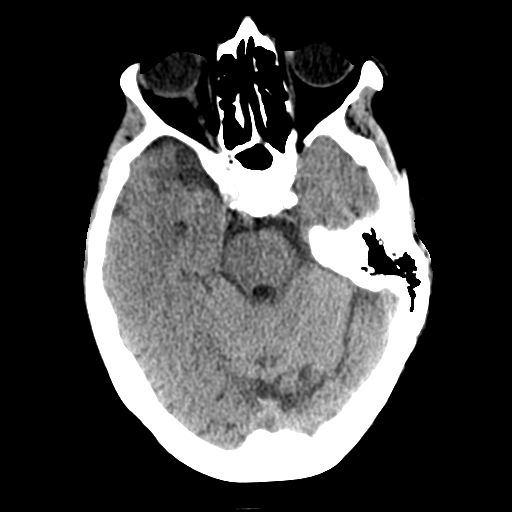
[im 11/30  brain]
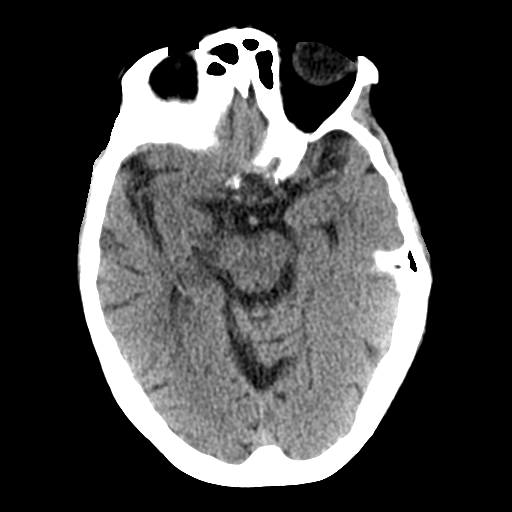
[im 14/30  brain]
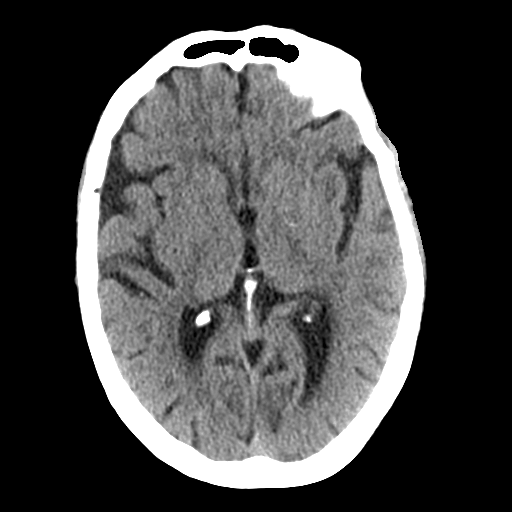
[im 14/30  bone]
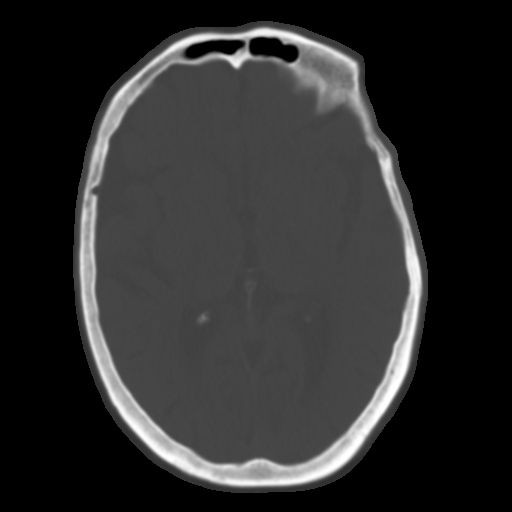
[im 17/30  brain]
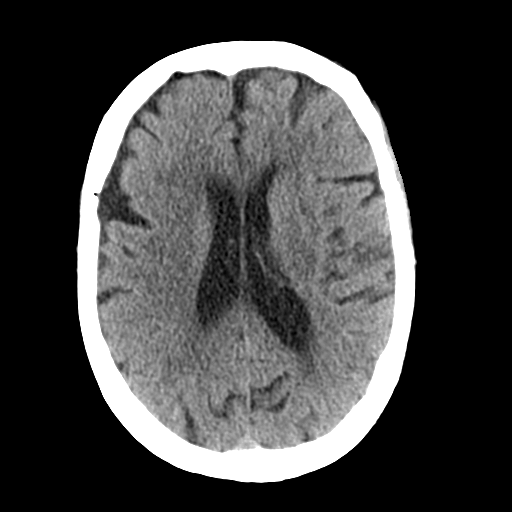
[im 20/30  brain]
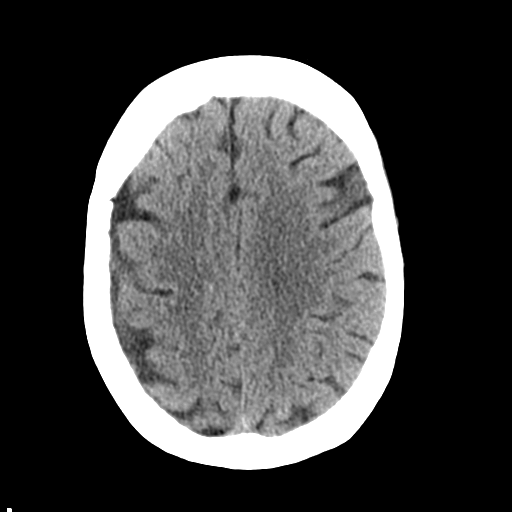
[im 23/30  brain]
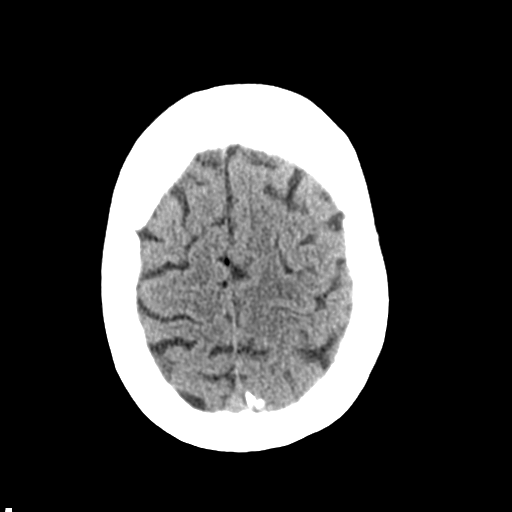
[im 25/30  brain]
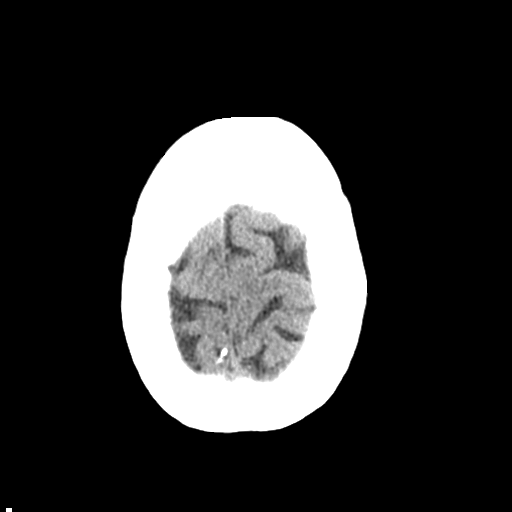
[im 25/30  bone]
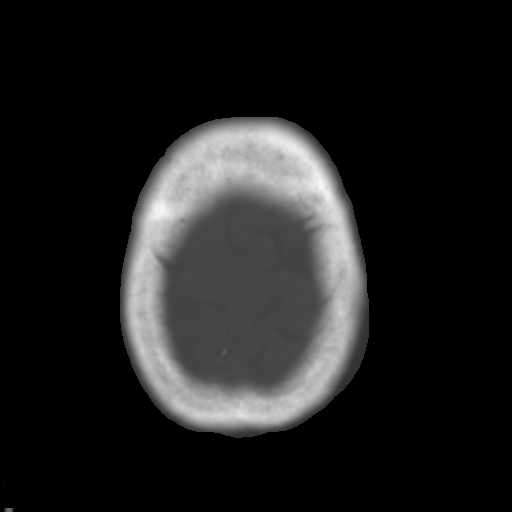
[im 28/30  brain]
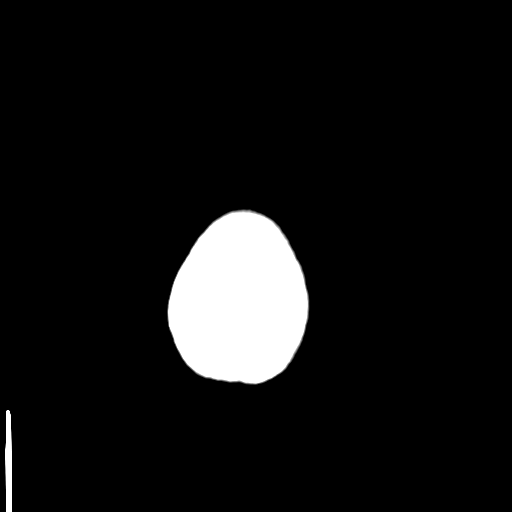

[Series 4: coronal soft tissue · coronal · 0.28mm/px · 3 of 64 slices shown]
[im 24/64  brain]
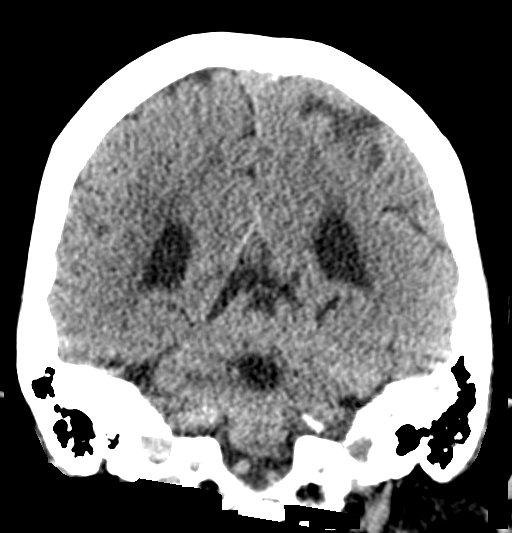
[im 29/64  brain]
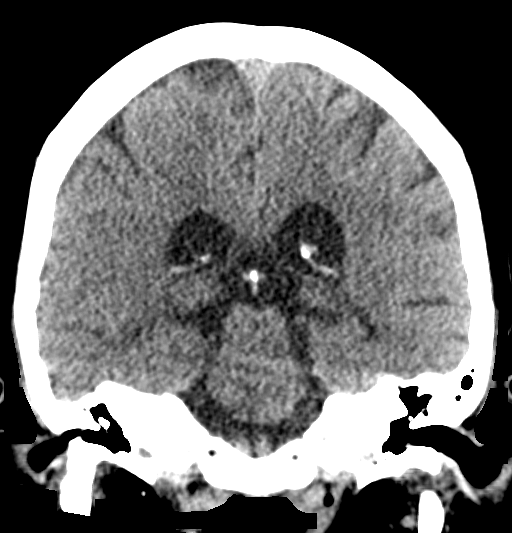
[im 35/64  brain]
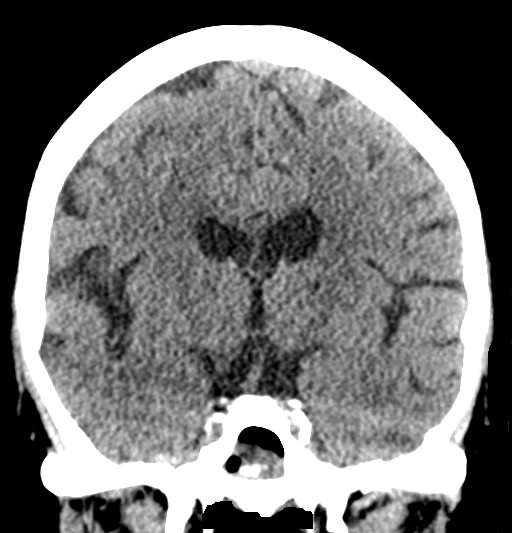

[Series 5: sagittal soft tissue · sagittal · 0.29mm/px · 3 of 48 slices shown]
[im 17/48  brain]
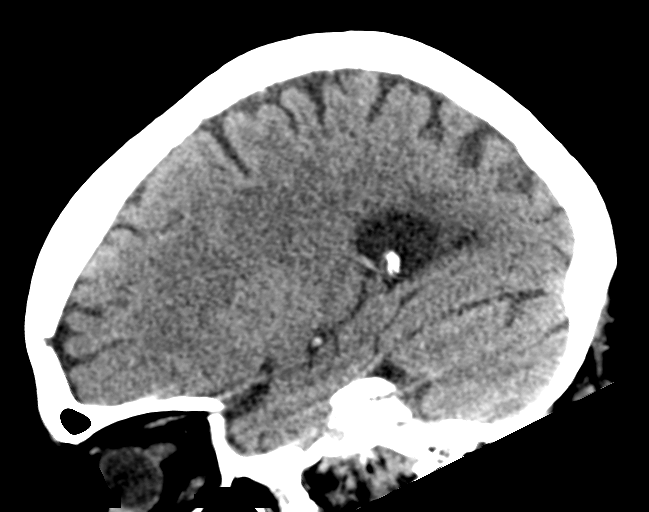
[im 24/48  brain]
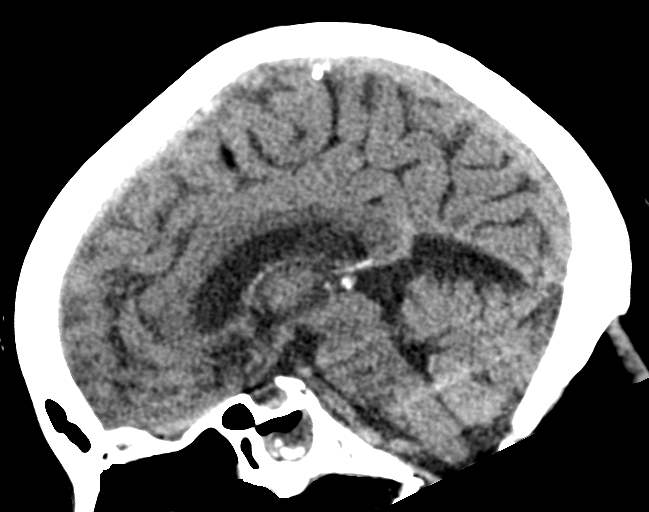
[im 31/48  brain]
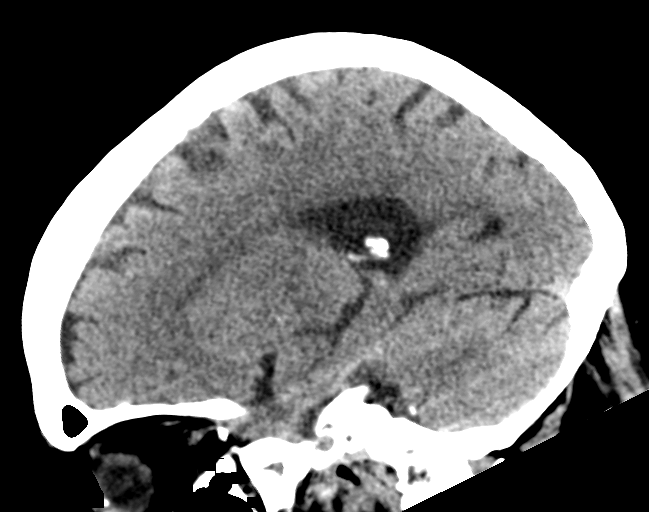

[16 of 47 positions shown; findings below may reference images not displayed]

FINDINGS: Brain: There is atrophy and chronic small vessel disease changes. No
acute intracranial abnormality. Specifically, no hemorrhage,
hydrocephalus, mass lesion, acute infarction, or significant
intracranial injury.

Vascular: No hyperdense vessel or unexpected calcification.

Skull: No acute calvarial abnormality.

Sinuses/Orbits: No acute finding

Other: None
IMPRESSION: Atrophy, chronic microvascular disease.

No acute intracranial abnormality.
